# Patient Record
Sex: Male | Born: 1975 | Race: White | Hispanic: No | Marital: Married | Smoking: Former smoker
Health system: Southern US, Community
[De-identification: ages and names within clinical notes are randomized; demographics above are authoritative.]

## PROBLEM LIST (undated history)

## (undated) DIAGNOSIS — F32A Depression, unspecified: Secondary | ICD-10-CM

## (undated) DIAGNOSIS — E119 Type 2 diabetes mellitus without complications: Secondary | ICD-10-CM

## (undated) DIAGNOSIS — I10 Essential (primary) hypertension: Secondary | ICD-10-CM

## (undated) DIAGNOSIS — M1990 Unspecified osteoarthritis, unspecified site: Secondary | ICD-10-CM

## (undated) DIAGNOSIS — E785 Hyperlipidemia, unspecified: Secondary | ICD-10-CM

## (undated) DIAGNOSIS — Z87442 Personal history of urinary calculi: Secondary | ICD-10-CM

## (undated) DIAGNOSIS — F329 Major depressive disorder, single episode, unspecified: Secondary | ICD-10-CM

## (undated) HISTORY — PX: BACK SURGERY: SHX140

## (undated) HISTORY — PX: ABDOMINAL SURGERY: SHX537

## (undated) HISTORY — PX: PARTIAL COLECTOMY: SHX5273

## (undated) HISTORY — PX: APPENDECTOMY: SHX54

---

## 2002-12-03 ENCOUNTER — Encounter: Payer: Self-pay | Admitting: Emergency Medicine

## 2002-12-03 ENCOUNTER — Emergency Department (HOSPITAL_COMMUNITY): Admission: EM | Admit: 2002-12-03 | Discharge: 2002-12-03 | Payer: Self-pay | Admitting: Emergency Medicine

## 2003-05-15 ENCOUNTER — Emergency Department (HOSPITAL_COMMUNITY): Admission: EM | Admit: 2003-05-15 | Discharge: 2003-05-15 | Payer: Self-pay | Admitting: Emergency Medicine

## 2004-03-23 ENCOUNTER — Emergency Department (HOSPITAL_COMMUNITY): Admission: EM | Admit: 2004-03-23 | Discharge: 2004-03-23 | Payer: Self-pay | Admitting: Emergency Medicine

## 2004-11-17 ENCOUNTER — Emergency Department (HOSPITAL_COMMUNITY): Admission: EM | Admit: 2004-11-17 | Discharge: 2004-11-17 | Payer: Self-pay | Admitting: Emergency Medicine

## 2005-04-14 ENCOUNTER — Ambulatory Visit: Payer: Self-pay | Admitting: "Endocrinology

## 2006-05-10 ENCOUNTER — Ambulatory Visit (HOSPITAL_COMMUNITY): Admission: RE | Admit: 2006-05-10 | Discharge: 2006-05-10 | Payer: Self-pay | Admitting: Family Medicine

## 2007-09-21 ENCOUNTER — Emergency Department (HOSPITAL_COMMUNITY): Admission: EM | Admit: 2007-09-21 | Discharge: 2007-09-21 | Payer: Self-pay | Admitting: Emergency Medicine

## 2007-12-07 ENCOUNTER — Ambulatory Visit (HOSPITAL_COMMUNITY): Admission: RE | Admit: 2007-12-07 | Discharge: 2007-12-07 | Payer: Self-pay | Admitting: Family Medicine

## 2007-12-08 ENCOUNTER — Emergency Department (HOSPITAL_COMMUNITY): Admission: EM | Admit: 2007-12-08 | Discharge: 2007-12-08 | Payer: Self-pay | Admitting: Emergency Medicine

## 2008-05-10 ENCOUNTER — Emergency Department (HOSPITAL_COMMUNITY): Admission: EM | Admit: 2008-05-10 | Discharge: 2008-05-10 | Payer: Self-pay | Admitting: Emergency Medicine

## 2008-05-10 ENCOUNTER — Encounter: Payer: Self-pay | Admitting: Orthopedic Surgery

## 2008-05-15 ENCOUNTER — Ambulatory Visit: Payer: Self-pay | Admitting: Orthopedic Surgery

## 2008-05-15 DIAGNOSIS — E119 Type 2 diabetes mellitus without complications: Secondary | ICD-10-CM

## 2008-05-15 DIAGNOSIS — M25579 Pain in unspecified ankle and joints of unspecified foot: Secondary | ICD-10-CM | POA: Insufficient documentation

## 2008-05-15 DIAGNOSIS — S93409A Sprain of unspecified ligament of unspecified ankle, initial encounter: Secondary | ICD-10-CM | POA: Insufficient documentation

## 2008-05-17 ENCOUNTER — Ambulatory Visit: Payer: Self-pay | Admitting: Orthopedic Surgery

## 2008-05-21 ENCOUNTER — Ambulatory Visit: Payer: Self-pay | Admitting: Orthopedic Surgery

## 2008-06-11 ENCOUNTER — Ambulatory Visit: Payer: Self-pay | Admitting: Orthopedic Surgery

## 2008-07-02 ENCOUNTER — Ambulatory Visit: Payer: Self-pay | Admitting: Orthopedic Surgery

## 2008-09-11 ENCOUNTER — Encounter: Payer: Self-pay | Admitting: Orthopedic Surgery

## 2009-06-29 ENCOUNTER — Ambulatory Visit (HOSPITAL_COMMUNITY): Admission: RE | Admit: 2009-06-29 | Discharge: 2009-06-29 | Payer: Self-pay | Admitting: Internal Medicine

## 2010-06-12 LAB — GLUCOSE, CAPILLARY: Glucose-Capillary: 211 mg/dL — ABNORMAL HIGH (ref 70–99)

## 2010-11-28 LAB — CBC
MCV: 89.8
RBC: 4.23
WBC: 5.3

## 2010-11-28 LAB — URINALYSIS, ROUTINE W REFLEX MICROSCOPIC
Bilirubin Urine: NEGATIVE
Nitrite: NEGATIVE
Specific Gravity, Urine: 1.01
pH: 6.5

## 2010-11-28 LAB — DIFFERENTIAL
Lymphocytes Relative: 11 — ABNORMAL LOW
Lymphs Abs: 0.6 — ABNORMAL LOW
Monocytes Relative: 6
Neutro Abs: 4.4
Neutrophils Relative %: 83 — ABNORMAL HIGH

## 2010-11-28 LAB — BASIC METABOLIC PANEL
Chloride: 98
Creatinine, Ser: 1
GFR calc Af Amer: >60
GFR calc non Af Amer: >60

## 2010-11-28 LAB — URINE MICROSCOPIC-ADD ON

## 2011-04-20 ENCOUNTER — Encounter (INDEPENDENT_AMBULATORY_CARE_PROVIDER_SITE_OTHER): Payer: 59 | Admitting: Ophthalmology

## 2011-04-20 DIAGNOSIS — H33309 Unspecified retinal break, unspecified eye: Secondary | ICD-10-CM

## 2011-04-20 DIAGNOSIS — H4420 Degenerative myopia, unspecified eye: Secondary | ICD-10-CM

## 2011-04-20 DIAGNOSIS — E11319 Type 2 diabetes mellitus with unspecified diabetic retinopathy without macular edema: Secondary | ICD-10-CM

## 2011-04-20 DIAGNOSIS — H2510 Age-related nuclear cataract, unspecified eye: Secondary | ICD-10-CM

## 2011-04-20 DIAGNOSIS — E1039 Type 1 diabetes mellitus with other diabetic ophthalmic complication: Secondary | ICD-10-CM

## 2011-04-20 DIAGNOSIS — H43819 Vitreous degeneration, unspecified eye: Secondary | ICD-10-CM

## 2011-04-27 ENCOUNTER — Ambulatory Visit (INDEPENDENT_AMBULATORY_CARE_PROVIDER_SITE_OTHER): Payer: 59 | Admitting: Ophthalmology

## 2011-04-27 DIAGNOSIS — H33309 Unspecified retinal break, unspecified eye: Secondary | ICD-10-CM

## 2011-05-06 ENCOUNTER — Ambulatory Visit (INDEPENDENT_AMBULATORY_CARE_PROVIDER_SITE_OTHER): Payer: 59 | Admitting: Ophthalmology

## 2011-08-26 ENCOUNTER — Ambulatory Visit (INDEPENDENT_AMBULATORY_CARE_PROVIDER_SITE_OTHER): Payer: 59 | Admitting: Ophthalmology

## 2012-01-19 ENCOUNTER — Emergency Department (HOSPITAL_COMMUNITY)
Admission: EM | Admit: 2012-01-19 | Discharge: 2012-01-19 | Disposition: A | Discharge status: Home / Self Care | Payer: 59 | Attending: Emergency Medicine | Admitting: Emergency Medicine

## 2012-01-19 ENCOUNTER — Encounter (HOSPITAL_COMMUNITY): Payer: Self-pay | Admitting: *Deleted

## 2012-01-19 DIAGNOSIS — Z794 Long term (current) use of insulin: Secondary | ICD-10-CM | POA: Insufficient documentation

## 2012-01-19 DIAGNOSIS — S39012A Strain of muscle, fascia and tendon of lower back, initial encounter: Secondary | ICD-10-CM

## 2012-01-19 DIAGNOSIS — S335XXA Sprain of ligaments of lumbar spine, initial encounter: Secondary | ICD-10-CM | POA: Insufficient documentation

## 2012-01-19 DIAGNOSIS — Y9389 Activity, other specified: Secondary | ICD-10-CM | POA: Insufficient documentation

## 2012-01-19 DIAGNOSIS — Y929 Unspecified place or not applicable: Secondary | ICD-10-CM | POA: Insufficient documentation

## 2012-01-19 DIAGNOSIS — Z79899 Other long term (current) drug therapy: Secondary | ICD-10-CM | POA: Insufficient documentation

## 2012-01-19 DIAGNOSIS — E119 Type 2 diabetes mellitus without complications: Secondary | ICD-10-CM | POA: Insufficient documentation

## 2012-01-19 DIAGNOSIS — X503XXA Overexertion from repetitive movements, initial encounter: Secondary | ICD-10-CM | POA: Insufficient documentation

## 2012-01-19 HISTORY — DX: Hyperlipidemia, unspecified: E78.5

## 2012-01-19 HISTORY — DX: Essential (primary) hypertension: I10

## 2012-01-19 HISTORY — DX: Type 2 diabetes mellitus without complications: E11.9

## 2012-01-19 MED ORDER — KETOROLAC TROMETHAMINE 60 MG/2ML IM SOLN
60.0000 mg | Freq: Once | INTRAMUSCULAR | Status: AC
  Administered 2012-01-19: 60 mg via INTRAMUSCULAR
  Filled 2012-01-19: qty 2

## 2012-01-19 MED ORDER — METHOCARBAMOL 500 MG PO TABS: 500.0000 mg | ORAL_TABLET | Freq: Two times a day (BID) | ORAL | Status: DC | PRN

## 2012-01-19 MED ORDER — NAPROXEN 500 MG PO TABS: 500.0000 mg | ORAL_TABLET | Freq: Two times a day (BID) | ORAL | Status: DC

## 2012-01-19 MED ORDER — OXYCODONE-ACETAMINOPHEN 5-325 MG PO TABS: 1.0000 | ORAL_TABLET | ORAL | Status: DC | PRN

## 2012-01-19 NOTE — ED Provider Notes (Signed)
History     CSN: [id number]  Arrival date & time 01/19/12  0603   First MD Initiated Contact with Patient 01/19/12 [phone]      Chief Complaint  Patient presents with  . Back Pain    (Consider location/radiation/quality/duration/timing/severity/associated sxs/prior treatment) HPI Comments: 36 year old male who presents with back pain. The pain is located in the right lower back, came on last night when he tried to get out of bed to go to a trickle water has been persistent. It is better when he holds still and when he sits up, it is worse when he tries to go from a sitting to a standing position. He denies nausea vomiting fevers chills numbness weakness or difficulty urinating. He has no history of cancer and is never used a needle for drugs. He notes that he was splitting wood yesterday and felt to truck loads. He was doing a lot of bending over and lifting.  Patient is a 36 y.o. male presenting with back pain. The history is provided by the patient.  Back Pain  Pertinent negatives include no fever, no numbness and no weakness.    Past Medical History  Diagnosis Date  . Diabetes mellitus without complication     History reviewed. No pertinent past surgical history.  No family history on file.  History  Substance Use Topics  . Smoking status: Not on file  . Smokeless tobacco: Not on file  . Alcohol Use:       Review of Systems  Constitutional: Negative for fever and chills.  HENT: Negative for neck pain.   Cardiovascular: Negative for leg swelling.  Gastrointestinal: Negative for nausea and vomiting.       No incontinence of bowel  Genitourinary: Negative for difficulty urinating.       No incontinence or retention  Musculoskeletal: Positive for back pain.  Skin: Negative for rash.  Neurological: Negative for weakness and numbness.    Allergies  Review of patient's allergies indicates not on file.  Home Medications   Current Outpatient Rx  Name  Route  Sig   Dispense  Refill  . INSULIN ASPART 100 UNIT/ML [place] SOLN   Subcutaneous   Inject into the skin 3 (three) times daily before meals.         . IRBESARTAN 150 MG PO TABS   Oral   Take 150 mg by mouth at bedtime.         . SERTRALINE HCL 100 MG PO TABS   Oral   Take 100 mg by mouth daily.         Marland Kitchen SIMVASTATIN 40 MG PO TABS   Oral   Take 40 mg by mouth at bedtime.         . METHOCARBAMOL 500 MG PO TABS   Oral   Take 1 tablet (500 mg total) by mouth 2 (two) times daily as needed.   20 tablet   0   . NAPROXEN 500 MG PO TABS   Oral   Take 1 tablet (500 mg total) by mouth 2 (two) times daily with a meal.   30 tablet   0   . OXYCODONE-ACETAMINOPHEN 5-325 MG PO TABS   Oral   Take 1 tablet by mouth every 4 (four) hours as needed for pain.   20 tablet   0     BP 155/76  Pulse 96  Temp 98.8 F (37.1 C) (Oral)  Resp 18  SpO2 98%  Physical Exam  Nursing note and vitals  reviewed. Constitutional: He appears well-developed and well-nourished.  HENT:  Head: Normocephalic and atraumatic.  Eyes: Conjunctivae normal are normal. No scleral icterus.  Cardiovascular: Normal rate, regular rhythm and intact distal pulses.   Pulmonary/Chest: Effort normal and breath sounds normal.  Abdominal: Soft.       No pulsating masses, no guarding, no tenderness  Musculoskeletal: He exhibits tenderness ( Local tenderness to the right lower back, no spinal tenderness of the cervical, thoracic, lumbar spines).       No spinal tenderness of the cervical, thoracic or lumbar spines  Neurological: He is alert.       Gait is antalgic secondary to low back pain, isolated strength of the bilateral lower extremities is normal, sensation normal, speech normal  Skin: Skin is warm and dry. No erythema.    ED Course  Procedures (including critical care time)  Labs Reviewed - No data to display No results found.   1. Lumbar strain       MDM  The patient likely has a muscular strain of his  lumbar back. He has no spinal tenderness and no focal neurologic deficits, no red flags for pathologic back pain. Intramuscular Toradol given, discharged home on Robaxin, Naprosyn and Percocet. He will take 1-2 days off of work as needed for appropriate healing.        Vida Roller, MD 01/19/12 [phone]

## 2012-01-19 NOTE — ED Notes (Addendum)
Pt reporting pain in lower back.  Reports he strained back yesterday chopping wood.  Pt states he did have back surgery several years ago but has had very little problems since that time.   Denies any other known injury.

## 2012-07-17 ENCOUNTER — Emergency Department (HOSPITAL_COMMUNITY): Payer: 59

## 2012-07-17 ENCOUNTER — Encounter (HOSPITAL_COMMUNITY): Payer: Self-pay

## 2012-07-17 ENCOUNTER — Emergency Department (HOSPITAL_COMMUNITY)
Admission: EM | Admit: 2012-07-17 | Discharge: 2012-07-17 | Disposition: A | Discharge status: Home / Self Care | Payer: 59 | Attending: Emergency Medicine | Admitting: Emergency Medicine

## 2012-07-17 DIAGNOSIS — I10 Essential (primary) hypertension: Secondary | ICD-10-CM | POA: Insufficient documentation

## 2012-07-17 DIAGNOSIS — F17200 Nicotine dependence, unspecified, uncomplicated: Secondary | ICD-10-CM | POA: Insufficient documentation

## 2012-07-17 DIAGNOSIS — M545 Low back pain, unspecified: Secondary | ICD-10-CM | POA: Insufficient documentation

## 2012-07-17 DIAGNOSIS — Z794 Long term (current) use of insulin: Secondary | ICD-10-CM | POA: Insufficient documentation

## 2012-07-17 DIAGNOSIS — E785 Hyperlipidemia, unspecified: Secondary | ICD-10-CM | POA: Insufficient documentation

## 2012-07-17 DIAGNOSIS — M549 Dorsalgia, unspecified: Secondary | ICD-10-CM

## 2012-07-17 DIAGNOSIS — E119 Type 2 diabetes mellitus without complications: Secondary | ICD-10-CM | POA: Insufficient documentation

## 2012-07-17 DIAGNOSIS — Z9889 Other specified postprocedural states: Secondary | ICD-10-CM | POA: Insufficient documentation

## 2012-07-17 DIAGNOSIS — G8929 Other chronic pain: Secondary | ICD-10-CM | POA: Insufficient documentation

## 2012-07-17 DIAGNOSIS — Z79899 Other long term (current) drug therapy: Secondary | ICD-10-CM | POA: Insufficient documentation

## 2012-07-17 MED ORDER — KETOROLAC TROMETHAMINE 60 MG/2ML IM SOLN
30.0000 mg | Freq: Once | INTRAMUSCULAR | Status: AC
  Administered 2012-07-17: 30 mg via INTRAMUSCULAR
  Filled 2012-07-17: qty 2

## 2012-07-17 MED ORDER — OXYCODONE-ACETAMINOPHEN 5-325 MG PO TABS: 1.0000 | ORAL_TABLET | ORAL | Status: DC | PRN

## 2012-07-17 NOTE — ED Notes (Signed)
Pt states it feels like a "spasm"  In his lower back, denies injury or strain

## 2012-07-17 NOTE — ED Provider Notes (Signed)
History  This chart was scribed for Joya Gaskins, MD by Bennett Scrape, ED Scribe. This patient was seen in room APA09/APA09 and the patient's care was started at 7:00 AM.  CSN: [id number]  Arrival date & time 07/17/12  0610   First MD Initiated Contact with Patient 07/17/12 0700      Chief Complaint  Patient presents with  . Back Pain     Patient is a 37 y.o. male presenting with back pain. The history is provided by the patient. No language interpreter was used.  Back Pain Location:  Lumbar spine Quality:  Cramping Radiates to:  Does not radiate Pain severity:  Severe Onset quality:  Sudden Timing:  Constant Progression:  Unchanged Chronicity:  Chronic Relieved by:  Nothing Worsened by:  Movement, bending and ambulation Ineffective treatments:  Muscle relaxants Associated symptoms: no bladder incontinence, no bowel incontinence, no dysuria, no numbness and no weakness     HPI Comments: Jesse Rodgers is a 37 y.o. male with a h/o prior back injury 20 years ago with hardware placement back surgery who presents to the Emergency Department complaining of sudden onset, constant, non-radiating lower back pain described as a cramp that woke him up this morning. He reports that he had milder pain in the same area last night but nothing this severe. He endorses that quick movements worsen the pain and states that he has experienced trouble ambulating since this morning due to the severity. He denies any recent falls in the past week. 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 He reports that he had a prescription for naprosyn for similar on-going back pain but reports that it has not been improving his symptoms. He states that he recently ran out but has a refill that he has yet to pick up. He states that he took robaxin last night but denies taking any medications this morning. He denies fevers, emesis, weakness, numbness, dysuria and urinary or bowel  incontinence as associated symptoms. He has a h/o DM that is well-controlled with lantus and novolog.   Past Medical History  Diagnosis Date  . Diabetes mellitus without complication   . Hypertension   . Hyperlipidemia     Past Surgical History  Procedure Laterality Date  . Back surgery    . Abdominal surgery      No family history on file.  History  Substance Use Topics  . Smoking status: Current Every Day Smoker  . Smokeless tobacco: Not on file  . Alcohol Use: No      Review of Systems  Gastrointestinal: Negative for bowel incontinence.  Genitourinary: Negative for bladder incontinence and dysuria.  Musculoskeletal: Positive for back pain.  Neurological: Negative for weakness and numbness.  All other systems reviewed and are negative.    Allergies  Review of patient's allergies indicates no known allergies.  Home Medications   Current Outpatient Rx  Name  Route  Sig  Dispense  Refill  . insulin aspart (NOVOLOG) 100 UNIT/ML injection   Subcutaneous   Inject into the skin 3 (three) times daily before meals.         . insulin glargine (LANTUS) 100 UNIT/ML injection   Subcutaneous   Inject 50 Units into the skin at bedtime.         . irbesartan (AVAPRO) 150 MG tablet   Oral   Take 150 mg by mouth at bedtime.         . methocarbamol (  ROBAXIN) 500 MG tablet   Oral   Take 1 tablet (500 mg total) by mouth 2 (two) times daily as needed.   20 tablet   0   . naproxen (NAPROSYN) 500 MG tablet   Oral   Take 1 tablet (500 mg total) by mouth 2 (two) times daily with a meal.   30 tablet   0   . rOPINIRole (REQUIP) 0.5 MG tablet   Oral   Take 0.5 mg by mouth 3 (three) times daily.         . sertraline (ZOLOFT) 100 MG tablet   Oral   Take 100 mg by mouth daily.         . simvastatin (ZOCOR) 40 MG tablet   Oral   Take 40 mg by mouth at bedtime.         Marland Kitchen oxyCODONE-acetaminophen (PERCOCET) 5-325 MG per tablet   Oral   Take 1 tablet by mouth  every 4 (four) hours as needed for pain.   20 tablet   0     Triage Vitals: BP 118/71  Pulse 98  Temp(Src) 98.2 F (36.8 C) (Oral)  Resp 20  Ht 6\' 2"  (1.88 m)  Wt 205 lb (92.987 kg)  BMI 26.31 kg/m2  Physical Exam  Nursing note and vitals reviewed.  CONSTITUTIONAL: Well developed/well nourished HEAD: Normocephalic/atraumatic EYES: EOMI/PERRL ENMT: Mucous membranes moist NECK: supple no meningeal signs SPINE:lumbar tenderness, well-healed scar to the lower back CV: S1/S2 noted, no murmurs/rubs/gallops noted LUNGS: Lungs are clear to auscultation bilaterally, no apparent distress ABDOMEN: soft, nontender, no rebound or guarding GU:no cva tenderness NEURO: Awake/alert, equal distal motor: hip flexion/knee flexion/extension, ankle dorsi/plantar flexion, great toe extension intact bilaterally, no clonus bilaterally, no apparent sensory deficit in any dermatome.  Equal patellar/achilles reflex noted.  EXTREMITIES: pulses normal, full ROM SKIN: warm, color normal PSYCH: no abnormalities of mood noted   ED Course  Procedures   Medications  ketorolac (TORADOL) injection 30 mg (not administered)    DIAGNOSTIC STUDIES: None performed.    COORDINATION OF CARE: 7:13 AM-Pt reports that he drove himself to the ED. Advised pt that he will not receive narcotics during this visit. Discussed treatment plan which includes xray of the lumbar spine and toradol with pt at bedside and pt agreed to plan. Will prescribe percocet.  Pt stable for d/c.  Advised need for outpatient followup He denied urinary symptoms to suggest uti He has no signs of acute neurologic/vascular emergency   MDM  Nursing notes including past medical history and social history reviewed and considered in documentation xrays reviewed and considered      I personally performed the services described in this documentation, which was scribed in my presence. The recorded information has been reviewed and is  accurate.       Joya Gaskins, MD 07/17/12 [phone]

## 2012-07-17 NOTE — ED Notes (Signed)
States he started having right sided back pain this morning around 4 am.  States that the pain feels like a spasm and he is having a hard time changing positions from sitting to standing.  States that this has happened to him before in the past.

## 2014-10-20 ENCOUNTER — Emergency Department (HOSPITAL_COMMUNITY)
Admission: EM | Admit: 2014-10-20 | Discharge: 2014-10-20 | Disposition: A | Discharge status: Home / Self Care | Payer: 59 | Attending: Emergency Medicine | Admitting: Emergency Medicine

## 2014-10-20 ENCOUNTER — Encounter (HOSPITAL_COMMUNITY): Payer: Self-pay | Admitting: Emergency Medicine

## 2014-10-20 DIAGNOSIS — Z79899 Other long term (current) drug therapy: Secondary | ICD-10-CM | POA: Insufficient documentation

## 2014-10-20 DIAGNOSIS — K0889 Other specified disorders of teeth and supporting structures: Secondary | ICD-10-CM

## 2014-10-20 DIAGNOSIS — Z794 Long term (current) use of insulin: Secondary | ICD-10-CM | POA: Insufficient documentation

## 2014-10-20 DIAGNOSIS — R220 Localized swelling, mass and lump, head: Secondary | ICD-10-CM

## 2014-10-20 DIAGNOSIS — E119 Type 2 diabetes mellitus without complications: Secondary | ICD-10-CM | POA: Insufficient documentation

## 2014-10-20 DIAGNOSIS — Z720 Tobacco use: Secondary | ICD-10-CM | POA: Insufficient documentation

## 2014-10-20 DIAGNOSIS — I10 Essential (primary) hypertension: Secondary | ICD-10-CM | POA: Insufficient documentation

## 2014-10-20 DIAGNOSIS — K088 Other specified disorders of teeth and supporting structures: Secondary | ICD-10-CM | POA: Insufficient documentation

## 2014-10-20 DIAGNOSIS — E785 Hyperlipidemia, unspecified: Secondary | ICD-10-CM | POA: Insufficient documentation

## 2014-10-20 MED ORDER — KETOROLAC TROMETHAMINE 60 MG/2ML IM SOLN
60.0000 mg | Freq: Once | INTRAMUSCULAR | Status: AC
  Administered 2014-10-20: 60 mg via INTRAMUSCULAR
  Filled 2014-10-20: qty 2

## 2014-10-20 NOTE — ED Notes (Addendum)
Patient states that he has had left-sided facial swelling since Monday. He went to dentist on Monday to get a tooth pulled that was bothering him. Patient has been trying to relieve the pain with pain killers and is on an antibiotic that the patient cannot remember the name of.

## 2014-10-20 NOTE — ED Provider Notes (Signed)
CSN: [id number]     Arrival date & time 10/20/14  0865 History   First MD Initiated Contact with Patient 10/20/14 [phone]     Chief Complaint  Patient presents with  . Facial Swelling     (Consider location/radiation/quality/duration/timing/severity/associated sxs/prior Treatment) HPI Comments: 39 year old male with history of diabetes, cigarette smoker presents with persistent left lower dental/jaw pain since tooth extraction on Monday. Patient went back and said the dentist and had suture placed for possible dry socket. Patient taking narcotics in his buttocks as directed pain still persistent. Patient tolerating liquid. Hurts constant and worse with palpation. Swelling is improved in the left face.  The history is provided by the patient.    Past Medical History  Diagnosis Date  . Diabetes mellitus without complication   . Hypertension   . Hyperlipidemia    Past Surgical History  Procedure Laterality Date  . Back surgery    . Abdominal surgery     No family history on file. Social History  Substance Use Topics  . Smoking status: Current Every Day Smoker  . Smokeless tobacco: None  . Alcohol Use: Yes     Comment: occasional    Review of Systems  Constitutional: Negative for fever and chills.  HENT: Positive for facial swelling. Negative for congestion.   Eyes: Negative for visual disturbance.  Respiratory: Negative for shortness of breath.   Cardiovascular: Negative for chest pain.  Gastrointestinal: Negative for vomiting and abdominal pain.  Genitourinary: Negative for dysuria and flank pain.  Musculoskeletal: Negative for back pain, neck pain and neck stiffness.  Skin: Negative for rash.  Neurological: Negative for light-headedness and headaches.      Allergies  Review of patient's allergies indicates no known allergies.  Home Medications   Prior to Admission medications   Medication Sig Start Date End Date Taking? Authorizing Provider  insulin aspart  (NOVOLOG) 100 UNIT/ML injection Inject into the skin 3 (three) times daily before meals.    Historical Provider, MD  insulin glargine (LANTUS) 100 UNIT/ML injection Inject 50 Units into the skin at bedtime.    Historical Provider, MD  irbesartan (AVAPRO) 150 MG tablet Take 150 mg by mouth at bedtime.    Historical Provider, MD  methocarbamol (ROBAXIN) 500 MG tablet Take 1 tablet (500 mg total) by mouth 2 (two) times daily as needed. 01/19/12   Eber Hong, MD  naproxen (NAPROSYN) 500 MG tablet Take 1 tablet (500 mg total) by mouth 2 (two) times daily with a meal. 01/19/12   Eber Hong, MD  oxyCODONE-acetaminophen (PERCOCET/ROXICET) 5-325 MG per tablet Take 1 tablet by mouth every 4 (four) hours as needed for pain. 07/17/12   Zadie Rhine, MD  rOPINIRole (REQUIP) 0.5 MG tablet Take 0.5 mg by mouth 3 (three) times daily.    Historical Provider, MD  sertraline (ZOLOFT) 100 MG tablet Take 100 mg by mouth daily.    Historical Provider, MD  simvastatin (ZOCOR) 40 MG tablet Take 40 mg by mouth at bedtime.    Historical Provider, MD   BP 155/99 mmHg  Pulse 96  Temp(Src) 98.5 F (36.9 C) (Oral)  Resp 16  Ht 6\' 3"  (1.905 m)  Wt 200 lb (90.719 kg)  BMI 25.00 kg/m2  SpO2 100% Physical Exam  Constitutional: He appears well-developed and well-nourished. No distress.  HENT:  Head: Normocephalic and atraumatic.  Patient has mild tenderness left lower anterior gingiva decide recent tooth extraction no swelling or abscess appreciated no trismus mild facial swelling in the left no  submandibular swelling neck supple.  Eyes: Conjunctivae are normal. Pupils are equal, round, and reactive to light.  Cardiovascular: Normal rate.   Pulmonary/Chest: Effort normal.  Nursing note and vitals reviewed.   ED Course  Procedures (including critical care time) Labs Review Labs Reviewed - No data to display  Imaging Review No results found. I have personally reviewed and evaluated these images and lab results  as part of my medical decision-making.   EKG Interpretation None      MDM   Final diagnoses:  Facial swelling  Pain, dental   patient presents with persistent swelling and pain after tooth extraction. No sign of emergent ENT infection. Patient on antibody X and has pain meds. Patient wishes to try Toradol prior to discharge.  Results and differential diagnosis were discussed with the patient/parent/guardian. Xrays were independently reviewed by myself.  Close follow up outpatient was discussed, comfortable with the plan.   Medications  ketorolac (TORADOL) injection 60 mg (60 mg Intramuscular Given 10/20/14 0727)    Filed Vitals:   10/20/14 0709  BP: 155/99  Pulse: 96  Temp: 98.5 F (36.9 C)  TempSrc: Oral  Resp: 16  Height:  (1.905 m)  Weight: 200 lb (90.719 kg)  SpO2: 100%    Final diagnoses:  Facial swelling  Pain, dental      Blane Ohara, MD 10/20/14 [phone]

## 2014-10-20 NOTE — ED Notes (Signed)
MD at bedside. 

## 2014-10-20 NOTE — Discharge Instructions (Signed)
If you were given medicines take as directed.  If you are on coumadin or contraceptives realize their levels and effectiveness is altered by many different medicines.  If you have any reaction (rash, tongues swelling, other) to the medicines stop taking and see a physician.   Call your dentist if no improvement by Monday, finish your antibiotics If your blood pressure was elevated in the ER make sure you follow up for management with a primary doctor or return for chest pain, shortness of breath or stroke symptoms.  Please follow up as directed and return to the ER or see a physician for new or worsening symptoms.  Thank you. Filed Vitals:   10/20/14 0709  BP: 155/99  Pulse: 96  Temp: 98.5 F (36.9 C)  TempSrc: Oral  Resp: 16  Height:  (1.905 m)  Weight: 200 lb (90.719 kg)  SpO2: 100%

## 2014-10-21 ENCOUNTER — Emergency Department (HOSPITAL_COMMUNITY): Payer: 59

## 2014-10-21 ENCOUNTER — Encounter (HOSPITAL_COMMUNITY): Payer: Self-pay | Admitting: *Deleted

## 2014-10-21 ENCOUNTER — Emergency Department (HOSPITAL_COMMUNITY)
Admission: EM | Admit: 2014-10-21 | Discharge: 2014-10-21 | Disposition: A | Discharge status: Home / Self Care | Payer: 59 | Attending: Emergency Medicine | Admitting: Emergency Medicine

## 2014-10-21 DIAGNOSIS — K047 Periapical abscess without sinus: Secondary | ICD-10-CM

## 2014-10-21 DIAGNOSIS — R220 Localized swelling, mass and lump, head: Secondary | ICD-10-CM

## 2014-10-21 DIAGNOSIS — I10 Essential (primary) hypertension: Secondary | ICD-10-CM | POA: Insufficient documentation

## 2014-10-21 DIAGNOSIS — Z720 Tobacco use: Secondary | ICD-10-CM | POA: Insufficient documentation

## 2014-10-21 DIAGNOSIS — E119 Type 2 diabetes mellitus without complications: Secondary | ICD-10-CM | POA: Insufficient documentation

## 2014-10-21 DIAGNOSIS — Z794 Long term (current) use of insulin: Secondary | ICD-10-CM | POA: Insufficient documentation

## 2014-10-21 DIAGNOSIS — E785 Hyperlipidemia, unspecified: Secondary | ICD-10-CM | POA: Insufficient documentation

## 2014-10-21 DIAGNOSIS — Z79899 Other long term (current) drug therapy: Secondary | ICD-10-CM | POA: Insufficient documentation

## 2014-10-21 LAB — CBC WITH DIFFERENTIAL/PLATELET
BASOS ABS: 0 10*3/uL (ref 0.0–0.1)
Basophils Relative: 0 % (ref 0–1)
Eosinophils Absolute: 0.3 10*3/uL (ref 0.0–0.7)
Eosinophils Relative: 3 % (ref 0–5)
HEMATOCRIT: 37.8 % — AB (ref 39.0–52.0)
Hemoglobin: 13.2 g/dL (ref 13.0–17.0)
LYMPHS ABS: 3.4 10*3/uL (ref 0.7–4.0)
LYMPHS PCT: 28 % (ref 12–46)
MCH: 31 pg (ref 26.0–34.0)
MCHC: 34.9 g/dL (ref 30.0–36.0)
MCV: 88.7 fL (ref 78.0–100.0)
MONO ABS: 0.7 10*3/uL (ref 0.1–1.0)
Monocytes Relative: 6 % (ref 3–12)
NEUTROS ABS: 7.5 10*3/uL (ref 1.7–7.7)
Neutrophils Relative %: 63 % (ref 43–77)
Platelets: 228 10*3/uL (ref 150–400)
RBC: 4.26 MIL/uL (ref 4.22–5.81)
RDW: 12.3 % (ref 11.5–15.5)
WBC: 11.9 10*3/uL — AB (ref 4.0–10.5)

## 2014-10-21 LAB — BASIC METABOLIC PANEL
ANION GAP: 8 (ref 5–15)
BUN: 20 mg/dL (ref 6–20)
CO2: 26 mmol/L (ref 22–32)
Calcium: 8.4 mg/dL — ABNORMAL LOW (ref 8.9–10.3)
Chloride: 103 mmol/L (ref 101–111)
Creatinine, Ser: 0.7 mg/dL (ref 0.61–1.24)
GFR calc Af Amer: >60 mL/min (ref >60)
GFR calc non Af Amer: >60 mL/min (ref >60)
GLUCOSE: 119 mg/dL — AB (ref 65–99)
POTASSIUM: 3.8 mmol/L (ref 3.5–5.1)
Sodium: 137 mmol/L (ref 135–145)

## 2014-10-21 MED ORDER — OXYCODONE-ACETAMINOPHEN 5-325 MG PO TABS: 1.0000 | ORAL_TABLET | Freq: Once | ORAL | Status: DC

## 2014-10-21 MED ORDER — IOHEXOL 300 MG/ML  SOLN
100.0000 mL | Freq: Once | INTRAMUSCULAR | Status: AC | PRN
  Administered 2014-10-21: 100 mL via INTRAVENOUS

## 2014-10-21 MED ORDER — KETOROLAC TROMETHAMINE 30 MG/ML IJ SOLN
30.0000 mg | Freq: Once | INTRAMUSCULAR | Status: AC
  Administered 2014-10-21: 30 mg via INTRAVENOUS
  Filled 2014-10-21: qty 1

## 2014-10-21 MED ORDER — BUPIVACAINE HCL (PF) 0.25 % IJ SOLN
20.0000 mL | Freq: Once | INTRAMUSCULAR | Status: AC
  Administered 2014-10-21: 20 mL
  Filled 2014-10-21: qty 30

## 2014-10-21 MED ORDER — OXYCODONE-ACETAMINOPHEN 5-325 MG PO TABS: 1.0000 | ORAL_TABLET | ORAL | Status: DC | PRN

## 2014-10-21 MED ORDER — IBUPROFEN 600 MG PO TABS: 600.0000 mg | ORAL_TABLET | Freq: Three times a day (TID) | ORAL | Status: DC | PRN

## 2014-10-21 MED ORDER — HYDROMORPHONE HCL 1 MG/ML IJ SOLN
1.0000 mg | Freq: Once | INTRAMUSCULAR | Status: AC
  Administered 2014-10-21: 1 mg via INTRAVENOUS
  Filled 2014-10-21: qty 1

## 2014-10-21 NOTE — ED Notes (Signed)
Patient complaining of dental pain and left facial swelling. Patient states he cannot open mouth or eat. States was seen here this morning for same.

## 2014-10-21 NOTE — ED Provider Notes (Signed)
CSN: [id number]     Arrival date & time 10/21/14  0036 History  This chart was scribed for Azalia Bilis, MD by Phillis Haggis, ED Scribe. This patient was seen in room APA07/APA07 and patient care was started at 12:51 AM.    Chief Complaint  Patient presents with  . Facial Swelling   The history is provided by the patient. No language interpreter was used.   HPI Comments: Jesse Rodgers is a 39 y.o. male with a hx of DM and current everyday smoker who presents to the Emergency Department complaining of left facial swelling onset one day ago. Pt states that he had a dental extraction on Monday and has been prescribed Clindamycin and Norco to no relief. He states that the dental pain and facial swelling has been worsening since Monday and was seen yesterday morning for the same symptoms; was given Toradol prior to discharge. He denies any other symptoms.   Past Medical History  Diagnosis Date  . Diabetes mellitus without complication   . Hypertension   . Hyperlipidemia    Past Surgical History  Procedure Laterality Date  . Back surgery    . Abdominal surgery     History reviewed. No pertinent family history. Social History  Substance Use Topics  . Smoking status: Current Every Day Smoker  . Smokeless tobacco: None  . Alcohol Use: Yes     Comment: occasional    Review of Systems  HENT: Positive for dental problem and facial swelling.   All other systems reviewed and are negative.  Allergies  Review of patient's allergies indicates no known allergies.  Home Medications   Prior to Admission medications   Medication Sig Start Date End Date Taking? Authorizing Provider  insulin aspart (NOVOLOG) 100 UNIT/ML injection Inject into the skin 3 (three) times daily before meals.    Historical Provider, MD  insulin glargine (LANTUS) 100 UNIT/ML injection Inject 50 Units into the skin at bedtime.    Historical Provider, MD  irbesartan (AVAPRO) 150 MG tablet Take 150 mg by mouth at bedtime.     Historical Provider, MD  rOPINIRole (REQUIP) 0.5 MG tablet Take 0.5 mg by mouth 3 (three) times daily.    Historical Provider, MD  sertraline (ZOLOFT) 100 MG tablet Take 100 mg by mouth daily.    Historical Provider, MD  simvastatin (ZOCOR) 40 MG tablet Take 40 mg by mouth at bedtime.    Historical Provider, MD   BP 158/93 mmHg  Pulse 89  Temp(Src) 99 F (37.2 C)  Resp 20  Ht  (1.905 m)  Wt 200 lb (90.719 kg)  BMI 25.00 kg/m2  SpO2 100%  Physical Exam  Constitutional: He is oriented to person, place, and time. He appears well-developed and well-nourished.  HENT:  Head: Normocephalic and atraumatic.  Left lower 1st and 2nd molars status post extraction with some gingival swelling; left sided facial swelling; moderate trismus; no drainage  Eyes: Conjunctivae and EOM are normal. Pupils are equal, round, and reactive to light.  Neck: Normal range of motion. Neck supple.  Cardiovascular: Normal rate and regular rhythm.   Pulmonary/Chest: Effort normal and breath sounds normal.  Abdominal: Soft. Bowel sounds are normal.  Musculoskeletal: Normal range of motion.  Neurological: He is alert and oriented to person, place, and time.  Skin: Skin is warm and dry.  Psychiatric: He has a normal mood and affect. His behavior is normal.  Nursing note and vitals reviewed.   ED Course  Procedures (including critical  care time)  NERVE BLOCK Performed by: Lyanne Co Consent: Verbal consent obtained. Required items: required blood products, implants, devices, and special equipment available Time out: Immediately prior to procedure a "time out" was called to verify the correct patient, procedure, equipment, support staff and site/side marked as required. Indication: dental pain and facial pain Nerve block body site: left infraorbital nerve Preparation: Patient was prepped and draped in the usual sterile fashion. Needle gauge: 24 G Location technique: anatomical landmarks Local  anesthetic: bupivicaine 0.25% Anesthetic total: 5 ml Outcome: pain improved Patient tolerance: Patient tolerated the procedure well with no immediate complications.      DIAGNOSTIC STUDIES: Oxygen Saturation is 100% on RA, normal by my interpretation.    COORDINATION OF CARE: 12:53 AM-Discussed treatment plan which includes IV fluids and CT scan with pt at bedside and pt agreed to plan.   Labs Review Labs Reviewed  CBC WITH DIFFERENTIAL/PLATELET - Abnormal; Notable for the following:    WBC 11.9 (*)    HCT 37.8 (*)    All other components within normal limits  BASIC METABOLIC PANEL - Abnormal; Notable for the following:    Glucose, Bld 119 (*)    Calcium 8.4 (*)    All other components within normal limits    Imaging Review Ct Soft Tissue Neck W Contrast  10/21/2014   CLINICAL DATA:  Dental extractions 6 days ago with left-sided facial pain and swelling.  EXAM: CT NECK WITH CONTRAST  TECHNIQUE: Multidetector CT imaging of the neck was performed using the standard protocol following the bolus administration of intravenous contrast.  CONTRAST:  OMNIPAQUE IOHEXOL 300 MG/ML  SOLN  COMPARISON:  None.  FINDINGS: Empty sockets in the left mandibular body, the first molar and second premolar, with outer alveolar ridge irregularity/fracture. There is gas within the empty socket, but not extending to bone. Extensive swelling present in the left face, throughout the upper left masticator space - extending into the visible portions of the temporalis. No drainable abscess. No floor mouth swelling or venous thrombosis.  Pharynx and larynx: Patent.  Salivary glands: Unremarkable  Thyroid: Unremarkable  Lymph nodes: Mild reactive enlargement left neck.  No cavitation.  Vascular: No venous thrombosis.  Limited intracranial: Unremarkable  Visualized orbits: Unremarkable  Mastoids and visualized paranasal sinuses: Mild scattered inflammatory mucosal thickening.  Skeleton: Left mandibular findings as  above.  Upper chest: No apical pneumonia.  IMPRESSION: Diffuse left masticator space myositis compatible with odontogenic infection given recent ipsilateral dental extraction. No abscess or airway effacement.   Electronically Signed   By: Marnee Spring M.D.   On: 10/21/2014 01:53  I personally reviewed the imaging tests through PACS system I reviewed available ER/hospitalization records through the EMR     EKG Interpretation None      MDM   Final diagnoses:  None    3:55 AM Feeling much better at this time. No abscess. Will continue clindamycin and follow up with his oral surgeon. Pts hydrocodone will be changed to oxycodone. Recommend ibuprofen in addition for antiinflamatory properties. Understands to return to ER for new or worsening symptoms   I personally performed the services described in this documentation, which was scribed in my presence. The recorded information has been reviewed and is accurate.       Azalia Bilis, MD 10/21/14 [phone]

## 2014-10-23 ENCOUNTER — Inpatient Hospital Stay (HOSPITAL_COMMUNITY)
Admission: EM | Admit: 2014-10-23 | Discharge: 2014-10-25 | DRG: 159 | Disposition: A | Discharge status: Home / Self Care | Payer: BLUE CROSS/BLUE SHIELD | Attending: Internal Medicine | Admitting: Internal Medicine

## 2014-10-23 ENCOUNTER — Encounter (HOSPITAL_COMMUNITY): Payer: Self-pay | Admitting: *Deleted

## 2014-10-23 DIAGNOSIS — F32A Depression, unspecified: Secondary | ICD-10-CM

## 2014-10-23 DIAGNOSIS — E119 Type 2 diabetes mellitus without complications: Secondary | ICD-10-CM

## 2014-10-23 DIAGNOSIS — M272 Inflammatory conditions of jaws: Secondary | ICD-10-CM | POA: Diagnosis not present

## 2014-10-23 DIAGNOSIS — I10 Essential (primary) hypertension: Secondary | ICD-10-CM | POA: Diagnosis present

## 2014-10-23 DIAGNOSIS — F329 Major depressive disorder, single episode, unspecified: Secondary | ICD-10-CM

## 2014-10-23 DIAGNOSIS — M609 Myositis, unspecified: Secondary | ICD-10-CM | POA: Diagnosis present

## 2014-10-23 DIAGNOSIS — E860 Dehydration: Secondary | ICD-10-CM | POA: Diagnosis present

## 2014-10-23 DIAGNOSIS — E782 Mixed hyperlipidemia: Secondary | ICD-10-CM

## 2014-10-23 DIAGNOSIS — K047 Periapical abscess without sinus: Secondary | ICD-10-CM

## 2014-10-23 DIAGNOSIS — K088 Other specified disorders of teeth and supporting structures: Secondary | ICD-10-CM | POA: Diagnosis not present

## 2014-10-23 DIAGNOSIS — L03211 Cellulitis of face: Secondary | ICD-10-CM

## 2014-10-23 DIAGNOSIS — E785 Hyperlipidemia, unspecified: Secondary | ICD-10-CM | POA: Diagnosis present

## 2014-10-23 DIAGNOSIS — F17200 Nicotine dependence, unspecified, uncomplicated: Secondary | ICD-10-CM | POA: Diagnosis present

## 2014-10-23 LAB — CBC WITH DIFFERENTIAL/PLATELET
BASOS ABS: 0 10*3/uL (ref 0.0–0.1)
BASOS PCT: 0 % (ref 0–1)
EOS ABS: 0.3 10*3/uL (ref 0.0–0.7)
EOS PCT: 2 % (ref 0–5)
HCT: 37.5 % — ABNORMAL LOW (ref 39.0–52.0)
Hemoglobin: 12.8 g/dL — ABNORMAL LOW (ref 13.0–17.0)
Lymphocytes Relative: 24 % (ref 12–46)
Lymphs Abs: 2.7 10*3/uL (ref 0.7–4.0)
MCH: 31 pg (ref 26.0–34.0)
MCHC: 34.1 g/dL (ref 30.0–36.0)
MCV: 90.8 fL (ref 78.0–100.0)
MONO ABS: 0.7 10*3/uL (ref 0.1–1.0)
Monocytes Relative: 6 % (ref 3–12)
Neutro Abs: 7.6 10*3/uL (ref 1.7–7.7)
Neutrophils Relative %: 68 % (ref 43–77)
PLATELETS: 309 10*3/uL (ref 150–400)
RBC: 4.13 MIL/uL — AB (ref 4.22–5.81)
RDW: 11.9 % (ref 11.5–15.5)
WBC: 11.2 10*3/uL — AB (ref 4.0–10.5)

## 2014-10-23 LAB — BASIC METABOLIC PANEL
ANION GAP: 7 (ref 5–15)
BUN: 17 mg/dL (ref 6–20)
CALCIUM: 9 mg/dL (ref 8.9–10.3)
CO2: 29 mmol/L (ref 22–32)
Chloride: 102 mmol/L (ref 101–111)
Creatinine, Ser: 0.73 mg/dL (ref 0.61–1.24)
GLUCOSE: 222 mg/dL — AB (ref 65–99)
Potassium: 4.5 mmol/L (ref 3.5–5.1)
SODIUM: 138 mmol/L (ref 135–145)

## 2014-10-23 LAB — CBG MONITORING, ED: Glucose-Capillary: 117 mg/dL — ABNORMAL HIGH (ref 65–99)

## 2014-10-23 MED ORDER — ONDANSETRON HCL 4 MG/2ML IJ SOLN: 4.0000 mg | Freq: Four times a day (QID) | INTRAMUSCULAR | Status: DC | PRN

## 2014-10-23 MED ORDER — AMPICILLIN-SULBACTAM SODIUM 3 (2-1) G IJ SOLR
INTRAMUSCULAR | Status: AC
  Filled 2014-10-23: qty 3

## 2014-10-23 MED ORDER — INSULIN ASPART 100 UNIT/ML ~~LOC~~ SOLN
0.0000 [IU] | Freq: Three times a day (TID) | SUBCUTANEOUS | Status: DC
  Administered 2014-10-24: 2 [IU] via SUBCUTANEOUS
  Administered 2014-10-24: 15 [IU] via SUBCUTANEOUS
  Administered 2014-10-24: 3 [IU] via SUBCUTANEOUS

## 2014-10-23 MED ORDER — ENOXAPARIN SODIUM 40 MG/0.4ML ~~LOC~~ SOLN
40.0000 mg | SUBCUTANEOUS | Status: DC
  Administered 2014-10-23 – 2014-10-24 (×2): 40 mg via SUBCUTANEOUS
  Filled 2014-10-23 (×2): qty 0.4

## 2014-10-23 MED ORDER — ENSURE ENLIVE PO LIQD
237.0000 mL | Freq: Two times a day (BID) | ORAL | Status: DC
Start: 2014-10-24 — End: 2014-10-24
  Administered 2014-10-24: 237 mL via ORAL

## 2014-10-23 MED ORDER — KETOROLAC TROMETHAMINE 30 MG/ML IJ SOLN
30.0000 mg | Freq: Once | INTRAMUSCULAR | Status: AC
  Administered 2014-10-23: 30 mg via INTRAVENOUS
  Filled 2014-10-23: qty 1

## 2014-10-23 MED ORDER — INSULIN GLARGINE 100 UNIT/ML ~~LOC~~ SOLN
45.0000 [IU] | Freq: Every day | SUBCUTANEOUS | Status: DC
  Administered 2014-10-24: 45 [IU] via SUBCUTANEOUS
  Filled 2014-10-23 (×2): qty 0.45

## 2014-10-23 MED ORDER — SERTRALINE HCL 50 MG PO TABS
100.0000 mg | ORAL_TABLET | Freq: Every day | ORAL | Status: DC
  Administered 2014-10-24 – 2014-10-25 (×2): 100 mg via ORAL
  Filled 2014-10-23 (×2): qty 2

## 2014-10-23 MED ORDER — SERTRALINE HCL 50 MG PO TABS: 100.0000 mg | ORAL_TABLET | Freq: Every day | ORAL | Status: DC

## 2014-10-23 MED ORDER — ACETAMINOPHEN 650 MG RE SUPP: 650.0000 mg | Freq: Four times a day (QID) | RECTAL | Status: DC | PRN

## 2014-10-23 MED ORDER — ONDANSETRON HCL 4 MG PO TABS: 4.0000 mg | ORAL_TABLET | Freq: Four times a day (QID) | ORAL | Status: DC | PRN

## 2014-10-23 MED ORDER — ACETAMINOPHEN 325 MG PO TABS
650.0000 mg | ORAL_TABLET | Freq: Four times a day (QID) | ORAL | Status: DC | PRN
  Administered 2014-10-24: 650 mg via ORAL
  Filled 2014-10-23: qty 2

## 2014-10-23 MED ORDER — SODIUM CHLORIDE 0.9 % IV SOLN
INTRAVENOUS | Status: DC
  Administered 2014-10-23 – 2014-10-25 (×5): via INTRAVENOUS

## 2014-10-23 MED ORDER — MORPHINE SULFATE (PF) 4 MG/ML IV SOLN
4.0000 mg | INTRAVENOUS | Status: DC | PRN
  Administered 2014-10-23: 4 mg via INTRAVENOUS
  Filled 2014-10-23: qty 1

## 2014-10-23 MED ORDER — IRBESARTAN 150 MG PO TABS
150.0000 mg | ORAL_TABLET | Freq: Every day | ORAL | Status: DC
  Filled 2014-10-23: qty 1

## 2014-10-23 MED ORDER — AMPICILLIN-SULBACTAM SODIUM 3 (2-1) G IJ SOLR
3.0000 g | Freq: Four times a day (QID) | INTRAMUSCULAR | Status: DC
  Administered 2014-10-23 – 2014-10-25 (×7): 3 g via INTRAVENOUS
  Filled 2014-10-23 (×11): qty 3

## 2014-10-23 MED ORDER — SIMVASTATIN 20 MG PO TABS
40.0000 mg | ORAL_TABLET | Freq: Every day | ORAL | Status: DC
  Administered 2014-10-25: 40 mg via ORAL
  Filled 2014-10-23 (×2): qty 2

## 2014-10-23 NOTE — ED Notes (Signed)
Pt has dental pain and swelling that started 10/15/14 after getting a tooth pulled. Pt has had swelling and increasing pain since. Pt followed up with his dentist on 10/18/14 and cleaned the area and was given antibiotic (clindamycin). Pt went to ED 8/20 because of increasing pain and dischaged. Pt followed up with primary care on 10/22/14 when he was given rocephin shot and augmentin.   Today pt woke up no improvement of swelling and mild swelling into his right eye.

## 2014-10-23 NOTE — H&P (Addendum)
[facility] History and Physical  Jesse Rodgers Rodgers:[id number] DOB: [birth year] DOA: 10/23/2014  Referring physician: Dr Jesse Rodgers Rodgers - [facility] PCP: Jesse Rodgers Rodgers   Chief Complaint: Dental pain  HPI: Jesse Rodgers Rodgers is a 39 y.o. male  Tooth pain w/ mastication for several days prior to extraction 8 days ago.  8 days ago underwent L mandible tooth extraction (L second mandibular molar) Tooth was infected at the time per dentist  Pain and swelling 7 days ago and started on clinda Pain and swelling continued. Norco and ibuprofen w/o benefit.  Daily cleaning of socket w/ ice water w/ syringe Cleaned by dentist and applied single stitch 3 days ago for dry socket and told to continue clinda and norco Came to ED 3 days ago and had CT and given a nerve block. continuied on clinda.  Started augmentin by family doctor 1 day ago.  Swelling returned today primarily in the cheek.  No purulent or bloody discharge  Review of Systems:  Constitutional:  No weight loss, night sweats, Fevers, chills, fatigue.  HEENT:  No headaches, Difficulty swallowing,Sore throat, No sneezing, itching, ear ache, nasal congestion, post nasal drip,  Cardio-vascular:  No chest pain, Orthopnea, PND, swelling in lower extremities, anasarca, dizziness, palpitations  GI:  No heartburn, indigestion, abdominal pain, nausea, vomiting, diarrhea, change in bowel habits,   Resp:   No shortness of breath with exertion or at rest. No excess mucus, no productive cough, No non-productive cough, No coughing up of blood.No change in color of mucus.No wheezing.No chest wall deformity  Skin:  no rash or lesions.  GU:  no dysuria, change in color of urine, no urgency or frequency. No flank pain.  Musculoskeletal:   No joint pain or swelling. No decreased range of motion. No back pain.  Psych:  No change in mood or affect. No depression or anxiety. No memory loss.   Past Medical History  Diagnosis Date  . Diabetes mellitus  without complication   . Hypertension   . Hyperlipidemia    Past Surgical History  Procedure Laterality Date  . Back surgery    . Abdominal surgery     Social History:  reports that he has been smoking.  He does not have any smokeless tobacco history on file. He reports that he drinks alcohol. He reports that he does not use illicit drugs.  No Known Allergies  No family history on file.   Prior to Admission medications   Medication Sig Start Date End Date Taking? Authorizing Provider  CLINDAMYCIN HCL PO Take by mouth.    Historical Provider, MD  HYDROcodone-acetaminophen (NORCO/VICODIN) 5-325 MG per tablet Take 1 tablet by mouth every 6 (six) hours as needed for moderate pain.    Historical Provider, MD  ibuprofen (ADVIL,MOTRIN) 600 MG tablet Take 1 tablet (600 mg total) by mouth every 8 (eight) hours as needed. 10/21/14   Azalia Bilis, MD  insulin aspart (NOVOLOG) 100 UNIT/ML injection Inject into the skin 3 (three) times daily before meals.    Historical Provider, MD  Insulin Glargine (TOUJEO SOLOSTAR) 300 UNIT/ML SOPN Inject 45 Units into the skin daily before supper.    Historical Provider, MD  irbesartan (AVAPRO) 150 MG tablet Take 150 mg by mouth at bedtime.    Historical Provider, MD  oxyCODONE-acetaminophen (PERCOCET/ROXICET) 5-325 MG per tablet Take 1 tablet by mouth every 4 (four) hours as needed for severe pain. 10/21/14   Azalia Bilis, MD  sertraline (ZOLOFT) 100 MG tablet Take 100 mg by mouth  daily.    Historical Provider, MD  simvastatin (ZOCOR) 40 MG tablet Take 40 mg by mouth at bedtime.    Historical Provider, MD   Physical Exam: Filed Vitals:   10/23/14 1749  BP: 150/80  Pulse: 102  Temp: 98.6 F (37 C)  TempSrc: Oral  Resp: 16  Height:  (1.905 m)  Weight: 90.719 kg (200 lb)  SpO2: 100%    Wt Readings from Last 3 Encounters:  10/23/14 90.719 kg (200 lb)  10/21/14 90.719 kg (200 lb)  10/20/14 90.719 kg (200 lb)    General:  Appears calm and  comfortable Eyes:  PERRL, normal lids, irises & conjunctiva ENT:  grossly normal hearing, lips & tongue, second left mandibular molar absent with remaining socket inflamed and tender to palpation, left cheek swollen and tender to palpation, no induration or fluctuance appreciated. No purulent discharge appreciated. Neck:  no LAD, masses or thyromegaly Cardiovascular:  RRR, no m/r/g. No LE edema. Telemetry:  SR, no arrhythmias  Respiratory:  CTA bilaterally, no w/r/r. Normal respiratory effort. Abdomen:  soft, ntnd Skin:  no rash or induration seen on limited exam Musculoskeletal:  grossly normal tone BUE/BLE Psychiatric:  grossly normal mood and affect, speech fluent and appropriate Neurologic:  grossly non-focal.          Labs on Admission:  Basic Metabolic Panel:  Recent Labs Lab 10/21/14 0112 10/23/14 2029  NA 137 138  K 3.8 4.5  CL 103 102  CO2 26 29  GLUCOSE 119* 222*  BUN 20 17  CREATININE 0.70 0.73  CALCIUM 8.4* 9.0   Liver Function Tests: No results for input(s): AST, ALT, ALKPHOS, BILITOT, PROT, ALBUMIN in the last 168 hours. No results for input(s): LIPASE, AMYLASE in the last 168 hours. No results for input(s): AMMONIA in the last 168 hours. CBC:  Recent Labs Lab 10/21/14 0112 10/23/14 2029  WBC 11.9* 11.2*  NEUTROABS 7.5 7.6  HGB 13.2 12.8*  HCT 37.8* 37.5*  MCV 88.7 90.8  PLT 228 309   Cardiac Enzymes: No results for input(s): CKTOTAL, CKMB, CKMBINDEX, TROPONINI in the last 168 hours.  BNP (last 3 results) No results for input(s): BNP in the last 8760 hours.  ProBNP (last 3 results) No results for input(s): PROBNP in the last 8760 hours.  CBG: No results for input(s): GLUCAP in the last 168 hours.  Radiological Exams on Admission: No results found.   Assessment/Plan Principal Problem:   Myositis Active Problems:   Diabetes mellitus without complication   Dental infection   Essential hypertension   HLD (hyperlipidemia)   Depression    Dehydration   L facial myositis: ongoing dental infection 8+ days. Failed outpatient anabolic therapy of clindamycin for 7 days and Augmentin 1 day. Multiple dental and ED visits. CT from 10/21/2014 showing Diffuse left masticator space myositis compatible with odontogenic infection. WBC 11.2. AFVSS. No evidence of crepitus/nec. Fasciitis.  - Med Surge - Obs - Unasyn - IVF - socket flushes  Dehydration: very little oral intake for several days including fluids.  - NS 124ml/hr  DM: on injectables. CBG 222, no gap - A1c - SSI - Lantus 45 QHS  HTN: - continue Avapro  HLD: - continue Zocor  Depression:  - continue zoloft.    -   Code Status: Full DVT Prophylaxis: Lovenox Family Communication: Sister Disposition Plan: Penidngin improvmeent  Charline Hoskinson Shela Commons, MD Family Medicine [facility] [email or web address] Password [email or web address]

## 2014-10-23 NOTE — ED Provider Notes (Signed)
Patient seen and evaluated. Seen and discussed with Ivery Quale PA. Has facial cellulitis and left masseter space myositis by CT dated 8/21. Dental extraction 8 days ago. Has been on by mouth clindamycin and compliant with it for the last 7 days. Presents here with continued facial pain and trismus. Not septic toxic. However diabetic. States his blood sugars have become difficult to control.  Exam shows tenderness and soft tissue swelling along the left masseter space. Extraction of left premolar. No swelling to the floor the mouth. No elevation of the tongue. No brawniness or induration of the neck. Exam not consistent with fluid weeks. However, he is failed outpatient treatment. I discussed the case with Dr. Margot Ables [facility]. IV antibodies and admission is indicated.  Rolland Porter, MD 10/23/14 2125

## 2014-10-23 NOTE — ED Provider Notes (Addendum)
CSN: [id number]     Arrival date & time 10/23/14  1722 History   First MD Initiated Contact with Patient 10/23/14 1843     Chief Complaint  Patient presents with  . Dental Pain     (Consider location/radiation/quality/duration/timing/severity/associated sxs/prior Treatment) HPI Comments: Patient is a 39 year old male with a history of hypertension, diabetes mellitus, and hyperlipidemia who presents to the emergency department following extraction of a left premolar with increasing pain.  Patient states that he had a tooth pulled on August 15. He states that there was some infection present before the tooth was pulled. He went back to see his dentist on August 18 at which time the area was irrigated, patient was placed on clindamycin, and a stitch was placed in the evening the patient was experiencing a socket issue. The problem continued and the patient was seen in the emergency department on August 20 at which time he had a CT scan done that revealed some myositis in the masseter area. The patient was given and biotics in the emergency department on and some medication for discomfort. The patient continued to have problems and went to the primary physician on August 22 at which time he again received some Ativan Rx and was placed on Augmentin. The patient presents to the emergency department today with his sister who is a nurse for additional evaluation as they feel the patient is getting worse instead of getting better. The patient has not had high fever reported. He has had some swelling and warmth of the face. He's not had difficulty swallowing, difficulty breathing. The swelling is now approaching the right eye, and the family wanted the patient reevaluated.  Patient is a 39 y.o. male presenting with tooth pain. The history is provided by the patient.  Dental Pain Associated symptoms: facial swelling   Associated symptoms: no fever and no neck pain     Past Medical History  Diagnosis Date   . Diabetes mellitus without complication   . Hypertension   . Hyperlipidemia    Past Surgical History  Procedure Laterality Date  . Back surgery    . Abdominal surgery     History reviewed. No pertinent family history. Social History  Substance Use Topics  . Smoking status: Current Every Day Smoker -- 1.00 packs/day for 20 years  . Smokeless tobacco: None  . Alcohol Use: Yes     Comment: occasional    Review of Systems  Constitutional: Positive for appetite change. Negative for fever and activity change.       All ROS Neg except as noted in HPI  HENT: Positive for dental problem and facial swelling. Negative for nosebleeds and rhinorrhea.   Eyes: Negative for photophobia and discharge.  Respiratory: Negative for cough, shortness of breath and wheezing.   Cardiovascular: Negative for chest pain and palpitations.  Gastrointestinal: Negative for abdominal pain and blood in stool.  Genitourinary: Negative for dysuria, frequency and hematuria.  Musculoskeletal: Negative for back pain, arthralgias and neck pain.  Skin: Negative.   Neurological: Negative for dizziness, seizures and speech difficulty.  Psychiatric/Behavioral: Negative for hallucinations and confusion.      Allergies  Review of patient's allergies indicates no known allergies.  Home Medications   Prior to Admission medications   Medication Sig Start Date End Date Taking? Authorizing Provider  Cinnamon 500 MG TABS Take 2,000 mg by mouth every morning.   Yes Historical Provider, MD  Garlic 1000 MG CAPS Take 2,000 mg by mouth every morning.  Yes Historical Provider, MD  insulin aspart (NOVOLOG) 100 UNIT/ML injection Inject 1-15 Units into the skin 3 (three) times daily before meals. Sliding scale   Yes Historical Provider, MD  Insulin Glargine (TOUJEO SOLOSTAR) 300 UNIT/ML SOPN Inject 45 Units into the skin daily before supper.   Yes Historical Provider, MD  losartan (COZAAR) 25 MG tablet Take 25 mg by mouth  daily.   Yes Historical Provider, MD  Multiple Vitamin (MULTIVITAMIN WITH MINERALS) TABS tablet Take 1 tablet by mouth daily.   Yes Historical Provider, MD  oxyCODONE-acetaminophen (PERCOCET) 10-325 MG per tablet Take 1 tablet by mouth every 6 (six) hours as needed for pain.   Yes Historical Provider, MD  sertraline (ZOLOFT) 100 MG tablet Take 100 mg by mouth daily.   Yes Historical Provider, MD  simvastatin (ZOCOR) 40 MG tablet Take 40 mg by mouth daily.    Yes Historical Provider, MD  amoxicillin-clavulanate (AUGMENTIN) 875-125 MG per tablet Take 1 tablet by mouth 2 (two) times daily. 10/25/14   Henderson Cloud, MD  ibuprofen (ADVIL,MOTRIN) 200 MG tablet Take 2 tablets (400 mg total) by mouth every 8 (eight) hours. 10/25/14   Henderson Cloud, MD  oxyCODONE-acetaminophen (PERCOCET/ROXICET) 5-325 MG per tablet Take 1-2 tablets by mouth every 6 (six) hours as needed for moderate pain. 10/25/14   Henderson Cloud, MD   BP 123/66 mmHg  Pulse 105  Temp(Src) 98.4 F (36.9 C) (Oral)  Resp 16  Ht 6\' 3"  (1.905 m)  Wt 191 lb 4.8 oz (86.773 kg)  BMI 23.91 kg/m2  SpO2 99% Physical Exam  Constitutional: He is oriented to person, place, and time. He appears well-developed and well-nourished.  Non-toxic appearance.  HENT:  Head: Normocephalic.  Right Ear: Tympanic membrane and external ear normal.  Left Ear: Tympanic membrane and external ear normal.  Exam is consistent with a previously extracted premolar on the left. There is swelling of the gum. But no visible abscess. The airway is patent. And there is no swelling under the tongue.  There is mild-to-moderate swelling of the face. The left face is slightly warmer than the right. There is no pre-or postauricular nodes palpated. And there no nodes palpated in the deep cervical chain. The patient has significant decreased range of motion of the left TMJ area, and tenderness over the masseter muscle area.  Eyes: EOM and lids are  normal. Pupils are equal, round, and reactive to light.  Neck: Normal range of motion. Neck supple. Carotid bruit is not present.  Cardiovascular: Regular rhythm, normal heart sounds, intact distal pulses and normal pulses.  Tachycardia present.   Pulmonary/Chest: Breath sounds normal. No respiratory distress.  Abdominal: Soft. Bowel sounds are normal. There is no tenderness. There is no guarding.  Musculoskeletal: Normal range of motion.  Lymphadenopathy:       Head (right side): No submandibular adenopathy present.       Head (left side): No submandibular adenopathy present.    He has no cervical adenopathy.  Neurological: He is alert and oriented to person, place, and time. He has normal strength. No cranial nerve deficit or sensory deficit.  Skin: Skin is warm and dry.  Psychiatric: He has a normal mood and affect. His speech is normal.  Nursing note and vitals reviewed.   ED Course Pt seen with me by Dr Judie Petit. Fayrene Fearing.  Dr Rolland Porter spoke with hospitalist, and pt to be admitted.  Procedures (including critical care time) Labs Review Labs Reviewed  CBC WITH DIFFERENTIAL/PLATELET - Abnormal; Notable for the following:    WBC 11.2 (*)    RBC 4.13 (*)    Hemoglobin 12.8 (*)    HCT 37.5 (*)    All other components within normal limits  BASIC METABOLIC PANEL - Abnormal; Notable for the following:    Glucose, Bld 222 (*)    All other components within normal limits  CBC - Abnormal; Notable for the following:    RBC 4.11 (*)    Hemoglobin 12.6 (*)    HCT 37.0 (*)    All other components within normal limits  BASIC METABOLIC PANEL - Abnormal; Notable for the following:    Glucose, Bld 119 (*)    Calcium 8.6 (*)    All other components within normal limits  GLUCOSE, CAPILLARY - Abnormal; Notable for the following:    Glucose-Capillary 141 (*)    All other components within normal limits  GLUCOSE, CAPILLARY - Abnormal; Notable for the following:    Glucose-Capillary 364 (*)    All  other components within normal limits  GLUCOSE, CAPILLARY - Abnormal; Notable for the following:    Glucose-Capillary 173 (*)    All other components within normal limits  BASIC METABOLIC PANEL - Abnormal; Notable for the following:    Calcium 8.0 (*)    All other components within normal limits  CBC - Abnormal; Notable for the following:    WBC 10.8 (*)    RBC 3.54 (*)    Hemoglobin 10.7 (*)    HCT 31.7 (*)    All other components within normal limits  GLUCOSE, CAPILLARY - Abnormal; Notable for the following:    Glucose-Capillary 256 (*)    All other components within normal limits  GLUCOSE, CAPILLARY - Abnormal; Notable for the following:    Glucose-Capillary 59 (*)    All other components within normal limits  CBG MONITORING, ED - Abnormal; Notable for the following:    Glucose-Capillary 117 (*)    All other components within normal limits  GLUCOSE, CAPILLARY    Imaging Review No results found. I have personally reviewed and evaluated these images and lab results as part of my medical decision-making.   EKG Interpretation None      MDM  The vital signs have been reviewed. Pulse oximetry is 100% on room air. The basic metabolic panel shows a glucose to be elevated at 220 otherwise within normal limits. There is no elevation in the [place] on gap. The complete blood count shows an elevation of white blood cell count of 11,200 on this is only slightly improved from the previous August 20 visit at which time it was 11,800.  I have reviewed the previous CT scan which was consistent with a myositis involving the masseter area. It appears that the patient has failed outpatient therapy. Will discuss with Dr. Fayrene Fearing for possible admission. I discussed the findings with the patient and the patient's sister and they are in agreement with admission.    Final diagnoses:  Facial cellulitis    **I have reviewed nursing notes, vital signs, and all appropriate lab and imaging results for  this patient.Ivery Quale, PA-C 10/27/14 1625  Rolland Porter, MD 10/28/14 1610  Rolland Porter, MD 10/28/14 [phone]

## 2014-10-24 DIAGNOSIS — K088 Other specified disorders of teeth and supporting structures: Secondary | ICD-10-CM | POA: Diagnosis present

## 2014-10-24 DIAGNOSIS — F17200 Nicotine dependence, unspecified, uncomplicated: Secondary | ICD-10-CM | POA: Diagnosis present

## 2014-10-24 DIAGNOSIS — E785 Hyperlipidemia, unspecified: Secondary | ICD-10-CM | POA: Diagnosis present

## 2014-10-24 DIAGNOSIS — K047 Periapical abscess without sinus: Secondary | ICD-10-CM | POA: Diagnosis present

## 2014-10-24 DIAGNOSIS — I10 Essential (primary) hypertension: Secondary | ICD-10-CM | POA: Diagnosis present

## 2014-10-24 DIAGNOSIS — F329 Major depressive disorder, single episode, unspecified: Secondary | ICD-10-CM | POA: Diagnosis present

## 2014-10-24 DIAGNOSIS — E860 Dehydration: Secondary | ICD-10-CM

## 2014-10-24 DIAGNOSIS — M609 Myositis, unspecified: Secondary | ICD-10-CM | POA: Diagnosis present

## 2014-10-24 DIAGNOSIS — M272 Inflammatory conditions of jaws: Secondary | ICD-10-CM | POA: Diagnosis not present

## 2014-10-24 DIAGNOSIS — E119 Type 2 diabetes mellitus without complications: Secondary | ICD-10-CM | POA: Diagnosis present

## 2014-10-24 LAB — BASIC METABOLIC PANEL
Anion gap: 7 (ref 5–15)
BUN: 17 mg/dL (ref 6–20)
CHLORIDE: 104 mmol/L (ref 101–111)
CO2: 28 mmol/L (ref 22–32)
CREATININE: 0.62 mg/dL (ref 0.61–1.24)
Calcium: 8.6 mg/dL — ABNORMAL LOW (ref 8.9–10.3)
GFR calc non Af Amer: >60 mL/min (ref >60)
Glucose, Bld: 119 mg/dL — ABNORMAL HIGH (ref 65–99)
Potassium: 4.3 mmol/L (ref 3.5–5.1)
Sodium: 139 mmol/L (ref 135–145)

## 2014-10-24 LAB — CBC
HEMATOCRIT: 37 % — AB (ref 39.0–52.0)
HEMOGLOBIN: 12.6 g/dL — AB (ref 13.0–17.0)
MCH: 30.7 pg (ref 26.0–34.0)
MCHC: 34.1 g/dL (ref 30.0–36.0)
MCV: 90 fL (ref 78.0–100.0)
Platelets: 265 10*3/uL (ref 150–400)
RBC: 4.11 MIL/uL — ABNORMAL LOW (ref 4.22–5.81)
RDW: 12.2 % (ref 11.5–15.5)
WBC: 10.3 10*3/uL (ref 4.0–10.5)

## 2014-10-24 LAB — GLUCOSE, CAPILLARY
GLUCOSE-CAPILLARY: 141 mg/dL — AB (ref 65–99)
Glucose-Capillary: 364 mg/dL — ABNORMAL HIGH (ref 65–99)
Glucose-Capillary: 173 mg/dL — ABNORMAL HIGH (ref 65–99)
Glucose-Capillary: 256 mg/dL — ABNORMAL HIGH (ref 65–99)

## 2014-10-24 MED ORDER — LOSARTAN POTASSIUM 50 MG PO TABS
25.0000 mg | ORAL_TABLET | Freq: Every day | ORAL | Status: DC
  Administered 2014-10-24: 25 mg via ORAL
  Filled 2014-10-24: qty 1

## 2014-10-24 MED ORDER — OXYCODONE-ACETAMINOPHEN 5-325 MG PO TABS
2.0000 | ORAL_TABLET | Freq: Once | ORAL | Status: AC
  Administered 2014-10-24: 2 via ORAL
  Filled 2014-10-24: qty 2

## 2014-10-24 MED ORDER — OXYCODONE-ACETAMINOPHEN 5-325 MG PO TABS
2.0000 | ORAL_TABLET | ORAL | Status: DC | PRN
  Administered 2014-10-24 – 2014-10-25 (×5): 2 via ORAL
  Filled 2014-10-24 (×5): qty 2

## 2014-10-24 MED ORDER — NICOTINE 21 MG/24HR TD PT24
21.0000 mg | MEDICATED_PATCH | Freq: Every day | TRANSDERMAL | Status: DC
  Filled 2014-10-24 (×2): qty 1

## 2014-10-24 MED ORDER — KETOROLAC TROMETHAMINE 30 MG/ML IJ SOLN
30.0000 mg | Freq: Once | INTRAMUSCULAR | Status: AC
  Administered 2014-10-24: 30 mg via INTRAVENOUS
  Filled 2014-10-24: qty 1

## 2014-10-24 MED ORDER — GLUCERNA SHAKE PO LIQD
237.0000 mL | Freq: Two times a day (BID) | ORAL | Status: DC
  Administered 2014-10-24: 237 mL via ORAL

## 2014-10-24 MED ORDER — KETOROLAC TROMETHAMINE 30 MG/ML IJ SOLN
30.0000 mg | Freq: Four times a day (QID) | INTRAMUSCULAR | Status: DC | PRN
  Administered 2014-10-24 – 2014-10-25 (×4): 30 mg via INTRAVENOUS
  Filled 2014-10-24 (×4): qty 1

## 2014-10-24 MED ORDER — ALUM & MAG HYDROXIDE-SIMETH 200-200-20 MG/5ML PO SUSP
15.0000 mL | ORAL | Status: DC | PRN
  Administered 2014-10-24: 15 mL via ORAL
  Filled 2014-10-24: qty 30

## 2014-10-24 NOTE — Care Management Note (Signed)
Case Management Note  Patient Details  Name: Jesse Rodgers MRN: [id number] Date of Birth: 1976-02-11   Expected Discharge Date:   10/25/2014               Expected Discharge Plan:  Home/Self Care  In-House Referral:  NA  Discharge planning Services  CM Consult  Post Acute Care Choice:  NA Choice offered to:  NA  DME Arranged:    DME Agency:     HH Arranged:    HH Agency:     Status of Service:  Completed, signed off  Medicare Important Message Given:    Date Medicare IM Given:    Medicare IM give by:    Date Additional Medicare IM Given:    Additional Medicare Important Message give by:     If discussed at Long Length of Stay Meetings, dates discussed:    Additional Comments: Pt is from home and independent with ADL's. Pt recently changed insurance companies and his insurance will no longer pay for his test strips. Pt given new Rx for glucose supplies but explained it would probably not change what his insurance would cover. Pt discharging home with self care in 1-2 days. No further CM needs.  Malcolm Metro, RN 10/24/2014, 1:38 PM

## 2014-10-24 NOTE — Progress Notes (Signed)
[facility] PROGRESS NOTE  DAEGAN ARIZMENDI ZOX:[id number] DOB: 1975-03-08 DOA: 10/23/2014 PCP: Pershing Proud  Assessment/Plan: Odontogenic infection  -Has had multiple ER visits due to pain. -Completed 5 days of clindamycin and had taken 1 day of Augmentin prior to admission. -States he feels a little improved today. -Plan to continue Unasyn and reassess for improvement in am. -Will need continued follow up with his dentist upon DC.  DM, Insulin Dependant  -Fair to good control. -Continue current regimen. -Check A1C  Dehydration -Due to decreased PO intake with odontogenic infection. -Continue IVF until PO intake increases.  HTN -Fair control. -Continue avapro,  Hyperlipidemia -Continue Zocor.  Depression -Continue zoloft.  Code Status: Full Code Family Communication: Sister Dawn at bedside  Disposition Plan: Home when ready; anticipate 24-48 hours   Consultants:  None   Antibiotics:  Unasyn   Subjective: Feels a little improved; pain and swelling better  Objective: Filed Vitals:   10/23/14 1749 10/23/14 2203 10/23/14 2233 10/24/14 0700  BP: 150/80 133/72 158/82 142/72  Pulse: 102 95 99 89  Temp: 98.6 F (37 C) 98.7 F (37.1 C) 98.8 F (37.1 C) 98.2 F (36.8 C)  TempSrc: Oral Oral Oral Oral  Resp: 16 16  15   Height: 6\' 3"  (1.905 m)  6\' 3"  (1.905 m)   Weight: 90.719 kg (200 lb)  86.773 kg (191 lb 4.8 oz)   SpO2: 100% 99% 97% 100%    Intake/Output Summary (Last 24 hours) at 10/24/14 1113 Last data filed at 10/24/14 0554  Gross per 24 hour  Intake 1006.25 ml  Output      0 ml  Net 1006.25 ml   Filed Weights   10/23/14 1749 10/23/14 2233  Weight: 90.719 kg (200 lb) 86.773 kg (191 lb 4.8 oz)    Exam:   General:  AA Ox3, left sided facial edema  Cardiovascular: RRR  Respiratory: CTA B  Abdomen: S/NT/ND/+BS  Extremities: no C/C/E   Neurologic:  Grossly intact and non-focal  Data Reviewed: Basic Metabolic  Panel:  Recent Labs Lab 10/21/14 0112 10/23/14 2029 10/24/14 0606  NA 137 138 139  K 3.8 4.5 4.3  CL 103 102 104  CO2 26 29 28   GLUCOSE 119* 222* 119*  BUN 20 17 17   CREATININE 0.70 0.73 0.62  CALCIUM 8.4* 9.0 8.6*   Liver Function Tests: No results for input(s): AST, ALT, ALKPHOS, BILITOT, PROT, ALBUMIN in the last 168 hours. No results for input(s): LIPASE, AMYLASE in the last 168 hours. No results for input(s): AMMONIA in the last 168 hours. CBC:  Recent Labs Lab 10/21/14 0112 10/23/14 2029 10/24/14 0606  WBC 11.9* 11.2* 10.3  NEUTROABS 7.5 7.6  --   HGB 13.2 12.8* 12.6*  HCT 37.8* 37.5* 37.0*  MCV 88.7 90.8 90.0  PLT 228 309 265   Cardiac Enzymes: No results for input(s): CKTOTAL, CKMB, CKMBINDEX, TROPONINI in the last 168 hours. BNP (last 3 results) No results for input(s): BNP in the last 8760 hours.  ProBNP (last 3 results) No results for input(s): PROBNP in the last 8760 hours.  CBG:  Recent Labs Lab 10/23/14 2159 10/24/14 0759  GLUCAP 117* 141*    No results found for this or any previous visit (from the past 240 hour(s)).   Studies: No results found.  Scheduled Meds: . ampicillin-sulbactam (UNASYN) IV  3 g Intravenous Q6H  . enoxaparin (LOVENOX) injection  40 mg Subcutaneous Q24H  . feeding supplement (ENSURE ENLIVE)  237 mL Oral BID  BM  . insulin aspart  0-15 Units Subcutaneous TID WC  . insulin glargine  45 Units Subcutaneous QAC supper  . irbesartan  150 mg Oral QHS  . sertraline  100 mg Oral Daily  . simvastatin  40 mg Oral QHS   Continuous Infusions: . sodium chloride 125 mL/hr at 10/24/14 0608    Principal Problem:   Odontogenic infection of jaw Active Problems:   Dental infection   Diabetes mellitus without complication   Essential hypertension   HLD (hyperlipidemia)   Depression   Dehydration    Time spent: 25 minutes. Greater than 50% of this time was spent in direct contact with the patient coordinating  care.    Chaya Jan  [facility] Pager [phone]  If 7PM-7AM, please contact night-coverage at [email or web address], password [email or web address] 10/24/2014, 11:13 AM

## 2014-10-24 NOTE — Progress Notes (Signed)
Patient stated he was having trouble chewing on a regular diet.  Changed diet order to full liquids until he is better able to chew.

## 2014-10-24 NOTE — Progress Notes (Signed)
Utilization review completed.  

## 2014-10-24 NOTE — Progress Notes (Signed)
Patient c/o heartburn.  Dr. Ardyth Harps paged requesting an antacid.  No orders received.  2nd page send to K. Wellsite geologist.

## 2014-10-25 LAB — BASIC METABOLIC PANEL
Anion gap: 7 (ref 5–15)
BUN: 11 mg/dL (ref 6–20)
CALCIUM: 8 mg/dL — AB (ref 8.9–10.3)
CO2: 28 mmol/L (ref 22–32)
CREATININE: 0.63 mg/dL (ref 0.61–1.24)
Chloride: 105 mmol/L (ref 101–111)
GFR calc non Af Amer: >60 mL/min (ref >60)
Glucose, Bld: 70 mg/dL (ref 65–99)
Potassium: 4 mmol/L (ref 3.5–5.1)
Sodium: 140 mmol/L (ref 135–145)

## 2014-10-25 LAB — CBC
HCT: 31.7 % — ABNORMAL LOW (ref 39.0–52.0)
Hemoglobin: 10.7 g/dL — ABNORMAL LOW (ref 13.0–17.0)
MCH: 30.2 pg (ref 26.0–34.0)
MCHC: 33.8 g/dL (ref 30.0–36.0)
MCV: 89.5 fL (ref 78.0–100.0)
PLATELETS: 252 10*3/uL (ref 150–400)
RBC: 3.54 MIL/uL — AB (ref 4.22–5.81)
RDW: 12 % (ref 11.5–15.5)
WBC: 10.8 10*3/uL — ABNORMAL HIGH (ref 4.0–10.5)

## 2014-10-25 LAB — GLUCOSE, CAPILLARY
GLUCOSE-CAPILLARY: 59 mg/dL — AB (ref 65–99)
Glucose-Capillary: 79 mg/dL (ref 65–99)

## 2014-10-25 MED ORDER — AMOXICILLIN-POT CLAVULANATE 875-125 MG PO TABS: 1.0000 | ORAL_TABLET | Freq: Two times a day (BID) | ORAL | Status: DC

## 2014-10-25 MED ORDER — OXYCODONE-ACETAMINOPHEN 5-325 MG PO TABS: 1.0000 | ORAL_TABLET | Freq: Four times a day (QID) | ORAL | Status: DC | PRN

## 2014-10-25 MED ORDER — IBUPROFEN 200 MG PO TABS: 400.0000 mg | ORAL_TABLET | Freq: Three times a day (TID) | ORAL | Status: DC

## 2014-10-25 NOTE — Progress Notes (Signed)
Inpatient Diabetes Program Recommendations  AACE/ADA: New Consensus Statement on Inpatient Glycemic Control (2013)  Target Ranges:  Prepandial:   less than 140 mg/dL      Peak postprandial:   less than 180 mg/dL (1-2 hours)      Critically ill patients:  140 - 180 mg/dL  Results for WILLIOM, CEDAR (MRN [id number]) as of 10/25/2014 07:29  Ref. Range 10/25/2014 05:53  Glucose Latest Ref Range: 65-99 mg/dL 70   Results for DONA, KLEMANN (MRN [id number]) as of 10/25/2014 07:29  Ref. Range 10/24/2014 07:59 10/24/2014 11:31 10/24/2014 16:30 10/24/2014 21:59  Glucose-Capillary Latest Ref Range: 65-99 mg/dL 782 (H) 956 (H) 213 (H) 256 (H)    Outpatient Diabetes medications: Toujeo 45 units QPM with supper, Novolog 0-15 units TID with meals Current orders for Inpatient glycemic control: Lantus 45 units QPM with supper, Novolog 0-15 units TID with meals  Inpatient Diabetes Program Recommendations Insulin - Basal: Lab glucose 70 mg/dl at 0:86 this morning. Please consider decreasing Lantus to 40 units QPM. Correction (SSI): Please consider ordering Novolog bedtime correction scale. Insulin - Meal Coverage: Please consider ordering Novolog 6 units TID with meals if patient eats at least 50% of meals. HgbA1C: A1C in process.  Thanks, Orlando Penner, RN, MSN, CCRN, CDE Diabetes Coordinator Inpatient Diabetes Program [phone] (Team Pager from 8am to 5pm) [phone] ([facility] office) [phone] [facility] office) [phone] [facility] office)

## 2014-10-25 NOTE — Discharge Summary (Signed)
Physician Discharge Summary  Jesse Rodgers ZOX:[id number] DOB: 1976-01-21 DOA: 10/23/2014  PCP: Pershing Proud  Admit date: 10/23/2014 Discharge date: 10/25/2014  Time spent: 45 minutes  Recommendations for Outpatient Follow-up:  -Will be discharged home today. -Advised to follow up with dentist over next 5 days. -Continue augmentin for 8 days.   Discharge Diagnoses:  Principal Problem:   Odontogenic infection of jaw Active Problems:   Dental infection   Diabetes mellitus without complication   Essential hypertension   HLD (hyperlipidemia)   Depression   Dehydration   Myositis   Discharge Condition: Stable and improved  Filed Weights   10/23/14 1749 10/23/14 2233  Weight: 90.719 kg (200 lb) 86.773 kg (191 lb 4.8 oz)    History of present illness:  Jesse Rodgers is a 39 y.o. male  Tooth pain w/ mastication for several days prior to extraction 8 days ago.  8 days ago underwent L mandible tooth extraction (L second mandibular molar) Tooth was infected at the time per dentist  Pain and swelling 7 days ago and started on clinda Pain and swelling continued. Norco and ibuprofen w/o benefit.  Daily cleaning of socket w/ ice water w/ syringe Cleaned by dentist and applied single stitch 3 days ago for dry socket and told to continue clinda and norco Came to ED 3 days ago and had CT and given a nerve block. continuied on clinda.  Started augmentin by family doctor 1 day ago.  Swelling returned today primarily in the cheek.  No purulent or bloody discharge  Hospital Course:   Odontogenic infection  -Has had multiple ER visits due to pain. -Completed 5 days of clindamycin and had taken 1 day of Augmentin prior to admission. -States he feels much improved today. -Plan to continue augmentin for 8 days. -Ibuprofen q 8 h scheduled with percocet as breakthhru.  -Will need continued follow up with his dentist upon DC.  DM, Insulin Dependant  -Fair  control. -Continue current regimen.  Dehydration -Due to decreased PO intake with odontogenic infection. -Resolved.  HTN -Fair control. -Continue losartan.  Hyperlipidemia -Continue Zocor.  Depression -Continue zoloft.    Procedures:  None   Consultations:  None  Discharge Instructions      Discharge Instructions    Diet Carb Modified    Complete by:  As directed      Increase activity slowly    Complete by:  As directed             Medication List    STOP taking these medications        GOODY HEADACHE PO      TAKE these medications        amoxicillin-clavulanate 875-125 MG per tablet  Commonly known as:  AUGMENTIN  Take 1 tablet by mouth 2 (two) times daily.     Cinnamon 500 MG Tabs  Take 2,000 mg by mouth every morning.     Garlic 1000 MG Caps  Take 2,000 mg by mouth every morning.     ibuprofen 200 MG tablet  Commonly known as:  ADVIL,MOTRIN  Take 2 tablets (400 mg total) by mouth every 8 (eight) hours.     insulin aspart 100 UNIT/ML injection  Commonly known as:  novoLOG  Inject 1-15 Units into the skin 3 (three) times daily before meals. Sliding scale     losartan 25 MG tablet  Commonly known as:  COZAAR  Take 25 mg by mouth daily.     multivitamin  with minerals Tabs tablet  Take 1 tablet by mouth daily.     oxyCODONE-acetaminophen 10-325 MG per tablet  Commonly known as:  PERCOCET  Take 1 tablet by mouth every 6 (six) hours as needed for pain.     oxyCODONE-acetaminophen 5-325 MG per tablet  Commonly known as:  PERCOCET/ROXICET  Take 1-2 tablets by mouth every 6 (six) hours as needed for moderate pain.     sertraline 100 MG tablet  Commonly known as:  ZOLOFT  Take 100 mg by mouth daily.     simvastatin 40 MG tablet  Commonly known as:  ZOCOR  Take 40 mg by mouth daily.     TOUJEO SOLOSTAR 300 UNIT/ML Sopn  Generic drug:  Insulin Glargine  Inject 45 Units into the skin daily before supper.       No Known  Allergies Follow-up Information    Follow up with JACKSON,SAMANTHA, PA-C. Schedule an appointment as soon as possible for a visit in 2 weeks.   Specialty:  Family Medicine   Contact information:   [address] [place] [address] [phone]        The results of significant diagnostics from this hospitalization (including imaging, microbiology, ancillary and laboratory) are listed below for reference.    Significant Diagnostic Studies: Ct Soft Tissue Neck W Contrast  10/21/2014   CLINICAL DATA:  Dental extractions 6 days ago with left-sided facial pain and swelling.  EXAM: CT NECK WITH CONTRAST  TECHNIQUE: Multidetector CT imaging of the neck was performed using the standard protocol following the bolus administration of intravenous contrast.  CONTRAST:  OMNIPAQUE IOHEXOL 300 MG/ML  SOLN  COMPARISON:  None.  FINDINGS: Empty sockets in the left mandibular body, the first molar and second premolar, with outer alveolar ridge irregularity/fracture. There is gas within the empty socket, but not extending to bone. Extensive swelling present in the left face, throughout the upper left masticator space - extending into the visible portions of the temporalis. No drainable abscess. No floor mouth swelling or venous thrombosis.  Pharynx and larynx: Patent.  Salivary glands: Unremarkable  Thyroid: Unremarkable  Lymph nodes: Mild reactive enlargement left neck.  No cavitation.  Vascular: No venous thrombosis.  Limited intracranial: Unremarkable  Visualized orbits: Unremarkable  Mastoids and visualized paranasal sinuses: Mild scattered inflammatory mucosal thickening.  Skeleton: Left mandibular findings as above.  Upper chest: No apical pneumonia.  IMPRESSION: Diffuse left masticator space myositis compatible with odontogenic infection given recent ipsilateral dental extraction. No abscess or airway effacement.   Electronically Signed   By: Marnee Spring M.D.   On: 10/21/2014 01:53     Microbiology: No results found for this or any previous visit (from the past 240 hour(s)).   Labs: Basic Metabolic Panel:  Recent Labs Lab 10/21/14 0112 10/23/14 2029 10/24/14 0606 10/25/14 0553  NA 137 138 139 140  K 3.8 4.5 4.3 4.0  CL 103 102 104 105  CO2 26 29 28 28   GLUCOSE 119* 222* 119* 70  BUN 20 17 17 11   CREATININE 0.70 0.73 0.62 0.63  CALCIUM 8.4* 9.0 8.6* 8.0*   Liver Function Tests: No results for input(s): AST, ALT, ALKPHOS, BILITOT, PROT, ALBUMIN in the last 168 hours. No results for input(s): LIPASE, AMYLASE in the last 168 hours. No results for input(s): AMMONIA in the last 168 hours. CBC:  Recent Labs Lab 10/21/14 0112 10/23/14 2029 10/24/14 0606 10/25/14 0553  WBC 11.9* 11.2* 10.3 10.8*  NEUTROABS 7.5 7.6  --   --  HGB 13.2 12.8* 12.6* 10.7*  HCT 37.8* 37.5* 37.0* 31.7*  MCV 88.7 90.8 90.0 89.5  PLT 228 309 265 252   Cardiac Enzymes: No results for input(s): CKTOTAL, CKMB, CKMBINDEX, TROPONINI in the last 168 hours. BNP: BNP (last 3 results) No results for input(s): BNP in the last 8760 hours.  ProBNP (last 3 results) No results for input(s): PROBNP in the last 8760 hours.  CBG:  Recent Labs Lab 10/24/14 1131 10/24/14 1630 10/24/14 2159 10/25/14 0747 10/25/14 0833  GLUCAP 364* 173* 256* 59* 79       Signed:  HERNANDEZ ACOSTA,Tyrrell Stephens  [facility] Pager: [phone] 10/25/2014, 9:43 AM

## 2014-10-25 NOTE — Progress Notes (Signed)
Patient d/c home. D/C instructions given, signed, and understood.  Patient left floor in wheelchair and was stable upon discharge.

## 2014-10-25 NOTE — Progress Notes (Signed)
Nutrition Brief Note  Patient identified on the Malnutrition Screening Tool (MST) Report  Pt presents with dental pain. He has experienced acute wt loss associated with same. His appetite is good and he has been able to consume his full liquids pudding, jello etc. He also has an order for Glucerna to supplement nutrition while his ability to eat as usual is diminished. Patient is consuming approximately 75% of meals at this time. Labs and medications reviewed. No physical signs of malnutrition.   Body mass index is 23.91 kg/(m^2). Patient meets criteria for normal range based on current BMI.    Royann Shivers MS,RD,CSG,LDN Office: [phone] Pager: [phone]

## 2014-10-25 NOTE — Progress Notes (Signed)
Hypoglycemic Event  CBG: 59  Treatment: 15 GM carbohydrate snack  Symptoms: None  Follow-up CBG: Time: 0833 CBG Result: 79  Possible Reasons for Event: Inadequate meal intake  Comments/MD notified: Dr. Edwin Cap, [facility]  Remember to initiate Hypoglycemia Order Set & complete

## 2014-11-02 ENCOUNTER — Other Ambulatory Visit (HOSPITAL_COMMUNITY): Payer: Self-pay | Admitting: Oral and Maxillofacial Surgery

## 2014-11-02 DIAGNOSIS — L089 Local infection of the skin and subcutaneous tissue, unspecified: Secondary | ICD-10-CM

## 2014-12-04 ENCOUNTER — Other Ambulatory Visit (HOSPITAL_COMMUNITY): Payer: Self-pay | Admitting: Oral and Maxillofacial Surgery

## 2014-12-04 DIAGNOSIS — L0201 Cutaneous abscess of face: Secondary | ICD-10-CM

## 2014-12-04 DIAGNOSIS — L089 Local infection of the skin and subcutaneous tissue, unspecified: Secondary | ICD-10-CM

## 2014-12-05 ENCOUNTER — Encounter (HOSPITAL_COMMUNITY): Payer: Self-pay

## 2014-12-05 ENCOUNTER — Ambulatory Visit (HOSPITAL_COMMUNITY)
Admission: RE | Admit: 2014-12-05 | Discharge: 2014-12-05 | Disposition: A | Discharge status: Home / Self Care | Payer: BLUE CROSS/BLUE SHIELD | Source: Ambulatory Visit | Attending: Oral and Maxillofacial Surgery | Admitting: Oral and Maxillofacial Surgery

## 2014-12-05 DIAGNOSIS — M6008 Infective myositis, other site: Secondary | ICD-10-CM | POA: Diagnosis not present

## 2014-12-05 DIAGNOSIS — L0201 Cutaneous abscess of face: Secondary | ICD-10-CM | POA: Diagnosis present

## 2014-12-05 DIAGNOSIS — B998 Other infectious disease: Secondary | ICD-10-CM | POA: Diagnosis present

## 2014-12-05 DIAGNOSIS — L089 Local infection of the skin and subcutaneous tissue, unspecified: Secondary | ICD-10-CM

## 2014-12-05 MED ORDER — IOHEXOL 300 MG/ML  SOLN
75.0000 mL | Freq: Once | INTRAMUSCULAR | Status: AC | PRN
  Administered 2014-12-05: 75 mL via INTRAVENOUS

## 2014-12-07 ENCOUNTER — Encounter (HOSPITAL_COMMUNITY): Payer: Self-pay | Admitting: *Deleted

## 2014-12-07 NOTE — H&P (Signed)
Jesse Rodgers is an 39 y.o. male.   Chief Complaint: Left facial swelling  HPI: Jesse Rodgers was originally referred to our office by his general dentist with left facial swelling.  Dr. Mylinda Latina removed tooth #19 on 10/15/2014 subsequently he went to [facility] on 10/20/2014 and was sent home with antibiotic therapy and released.  He then went to [facility] on 10/23/14 and was discharged on 10/25/2014 still with left facial swelling.  I then treated the patient on 10/30/2014 with I&D extraorally (temporal space) and intraorally.  The patient's facial swelling still did not improve.  The patient then had #17 and a second I&D procedure performed in office on 11/07/2014.  The patient improved slightly and had a normalize white count and decreased fevers; however, the swelling remained.  Over this course the patient has taken clindamycin then penicillin and flagyl.  Due to the lack of improvement, I had the patient scheduled for a CT [facility] of the [facility]illofacial region.  Which showed concern for osteomyelitis of the left ramus and temporalis space infection.  There is also apical radiolucencies associated with #14 and #15 consistent with abscess and an impacted #16.  The patient will require extractions and drainage of the space along with a bone biopsy on the left ramus all in the operating room.  PMHx:  Past Medical History  Diagnosis Date  . Hyperlipidemia     takes Simvastatin daily  . Hypertension     takes Losartan daily  . Depression     takes Zoloft daily  . Arthritis     back   . History of kidney stones   . Diabetes mellitus without complication (HCC)     takes Toujeo and Novolog nightly;average fasting blood sugar runs 150    PSx:  Past Surgical History  Procedure Laterality Date  . Back surgery    . Abdominal surgery    . Appendectomy    . Partial colectomy      Family Hx: History reviewed. No pertinent family history.  Social History:  reports that he has been smoking.  He does not  have any smokeless tobacco history on file. He reports that he drinks alcohol. He reports that he does not use illicit drugs.  Allergies: No Known Allergies  Meds:  No prescriptions prior to admission    Labs: No results found for this or any previous visit (from the past 48 hour(s)).  Radiology: No results found.  ROS: Pertinent items are noted in HPI.  Vitals: Ht  (1.905 m)  Wt 83.915 kg (185 lb)  BMI 23.12 kg/m2  Physical Exam: General appearance: alert and cooperative Head: Normocephalic, without obvious abnormality, atraumatic Eyes: conjunctivae/corneas clear. PERRL, EOM's intact. Fundi benign. Ears: normal TM's and external ear canals both ears Nose: Nares normal. Septum midline. Mucosa normal. No drainage or sinus tenderness. Throat: abnormal findings: dentition: fair Resp: clear to auscultation bilaterally Cardio: regular rate and rhythm, S1, S2 normal, no murmur, click, rub or gallop GI: soft, non-tender; bowel sounds normal; no masses,  no organomegaly Extremities: extremities normal, atraumatic, no cyanosis or edema Pulses: 2+ and symmetric Neurologic: Alert and oriented X 3, normal strength and tone. Normal symmetric reflexes. Normal coordination and gait  Assessment/Plan Jesse Rodgers has a left temporal space abscess; carious teeth #14 and 15 with periapical abscess; and a full bony impacted #16  He will require bone biopsy; incision and drainage of the left temporalis space; and extraction of #14, 15, and 16.  Jesse Rodgers,Jesse Rodgers  12/07/2014, 5:09 PM

## 2014-12-07 NOTE — Progress Notes (Signed)
Requested orders from Dr.Denton's office.

## 2014-12-07 NOTE — Progress Notes (Signed)
   12/07/14 1149  OBSTRUCTIVE SLEEP APNEA  Have you ever been diagnosed with sleep apnea through a sleep study? No  Do you snore loudly (loud enough to be heard through closed doors)?  1  Do you often feel tired, fatigued, or sleepy during the daytime (such as falling asleep during driving or talking to someone)? 0  Has anyone observed you stop breathing during your sleep? 1  Do you have, or are you being treated for high blood pressure? 1  BMI more than 35 kg/m2? 0  Age > 50 (1-yes) 0  Neck circumference greater than:Male 16 inches or larger, Male 17inches or larger? 0  Male Gender (Yes=1) 1  Obstructive Sleep Apnea Score 4  Score 5 or greater  Results sent to PCP

## 2014-12-10 ENCOUNTER — Encounter (HOSPITAL_COMMUNITY): Payer: Self-pay | Admitting: Certified Registered Nurse Anesthetist

## 2014-12-10 ENCOUNTER — Ambulatory Visit (HOSPITAL_COMMUNITY): Payer: BLUE CROSS/BLUE SHIELD | Admitting: Anesthesiology

## 2014-12-10 ENCOUNTER — Inpatient Hospital Stay (HOSPITAL_COMMUNITY)
Admission: AD | Admit: 2014-12-10 | Discharge: 2014-12-12 | DRG: 137 | Disposition: A | Discharge status: Home / Self Care | Payer: BLUE CROSS/BLUE SHIELD | Source: Ambulatory Visit | Attending: Oral and Maxillofacial Surgery | Admitting: Oral and Maxillofacial Surgery

## 2014-12-10 ENCOUNTER — Encounter (HOSPITAL_COMMUNITY)
Admission: AD | Disposition: A | Discharge status: Home / Self Care | Payer: Self-pay | Source: Ambulatory Visit | Attending: Oral and Maxillofacial Surgery

## 2014-12-10 ENCOUNTER — Observation Stay (HOSPITAL_COMMUNITY): Payer: BLUE CROSS/BLUE SHIELD

## 2014-12-10 DIAGNOSIS — E785 Hyperlipidemia, unspecified: Secondary | ICD-10-CM | POA: Diagnosis present

## 2014-12-10 DIAGNOSIS — F329 Major depressive disorder, single episode, unspecified: Secondary | ICD-10-CM | POA: Diagnosis present

## 2014-12-10 DIAGNOSIS — K122 Cellulitis and abscess of mouth: Secondary | ICD-10-CM | POA: Diagnosis present

## 2014-12-10 DIAGNOSIS — E119 Type 2 diabetes mellitus without complications: Secondary | ICD-10-CM | POA: Diagnosis present

## 2014-12-10 DIAGNOSIS — K011 Impacted teeth: Secondary | ICD-10-CM | POA: Diagnosis present

## 2014-12-10 DIAGNOSIS — K047 Periapical abscess without sinus: Principal | ICD-10-CM | POA: Diagnosis present

## 2014-12-10 DIAGNOSIS — K029 Dental caries, unspecified: Secondary | ICD-10-CM | POA: Diagnosis present

## 2014-12-10 DIAGNOSIS — I10 Essential (primary) hypertension: Secondary | ICD-10-CM | POA: Diagnosis present

## 2014-12-10 DIAGNOSIS — F17200 Nicotine dependence, unspecified, uncomplicated: Secondary | ICD-10-CM | POA: Diagnosis present

## 2014-12-10 HISTORY — PX: DENTAL SURGERY: SHX609

## 2014-12-10 HISTORY — DX: Unspecified osteoarthritis, unspecified site: M19.90

## 2014-12-10 HISTORY — DX: Major depressive disorder, single episode, unspecified: F32.9

## 2014-12-10 HISTORY — DX: Personal history of urinary calculi: Z87.442

## 2014-12-10 HISTORY — DX: Depression, unspecified: F32.A

## 2014-12-10 HISTORY — PX: TOOTH EXTRACTION: SHX859

## 2014-12-10 LAB — GLUCOSE, CAPILLARY
GLUCOSE-CAPILLARY: 246 mg/dL — AB (ref 65–99)
GLUCOSE-CAPILLARY: 218 mg/dL — AB (ref 65–99)
GLUCOSE-CAPILLARY: 218 mg/dL — AB (ref 65–99)
Glucose-Capillary: 160 mg/dL — ABNORMAL HIGH (ref 65–99)
Glucose-Capillary: 171 mg/dL — ABNORMAL HIGH (ref 65–99)
Glucose-Capillary: 246 mg/dL — ABNORMAL HIGH (ref 65–99)

## 2014-12-10 LAB — CBC
HCT: 38.6 % — ABNORMAL LOW (ref 39.0–52.0)
HEMOGLOBIN: 13 g/dL (ref 13.0–17.0)
MCH: 30.4 pg (ref 26.0–34.0)
MCHC: 33.7 g/dL (ref 30.0–36.0)
MCV: 90.2 fL (ref 78.0–100.0)
PLATELETS: 274 10*3/uL (ref 150–400)
RBC: 4.28 MIL/uL (ref 4.22–5.81)
RDW: 13.2 % (ref 11.5–15.5)
WBC: 4.9 10*3/uL (ref 4.0–10.5)

## 2014-12-10 LAB — BASIC METABOLIC PANEL
Anion gap: 9 (ref 5–15)
BUN: 12 mg/dL (ref 6–20)
CALCIUM: 8.9 mg/dL (ref 8.9–10.3)
CHLORIDE: 103 mmol/L (ref 101–111)
CO2: 26 mmol/L (ref 22–32)
Creatinine, Ser: 0.72 mg/dL (ref 0.61–1.24)
GFR calc non Af Amer: >60 mL/min (ref >60)
Glucose, Bld: 237 mg/dL — ABNORMAL HIGH (ref 65–99)
Potassium: 4.9 mmol/L (ref 3.5–5.1)
SODIUM: 138 mmol/L (ref 135–145)

## 2014-12-10 SURGERY — EXTRACTION, TOOTH, MOLAR
Anesthesia: General | Site: Mouth

## 2014-12-10 MED ORDER — PROPOFOL 10 MG/ML IV BOLUS
INTRAVENOUS | Status: AC
  Filled 2014-12-10: qty 20

## 2014-12-10 MED ORDER — CLINDAMYCIN PHOSPHATE 300 MG/50ML IV SOLN
300.0000 mg | Freq: Three times a day (TID) | INTRAVENOUS | Status: DC
Start: 2014-12-10 — End: 2014-12-12
  Administered 2014-12-10 – 2014-12-12 (×5): 300 mg via INTRAVENOUS
  Filled 2014-12-10 (×7): qty 50

## 2014-12-10 MED ORDER — INSULIN ASPART 100 UNIT/ML ~~LOC~~ SOLN
1.0000 [IU] | SUBCUTANEOUS | Status: DC
  Administered 2014-12-10: 5 [IU] via SUBCUTANEOUS

## 2014-12-10 MED ORDER — OXYCODONE-ACETAMINOPHEN 5-325 MG PO TABS
1.0000 | ORAL_TABLET | Freq: Four times a day (QID) | ORAL | Status: DC | PRN
  Administered 2014-12-11 – 2014-12-12 (×6): 1 via ORAL
  Filled 2014-12-10 (×6): qty 1

## 2014-12-10 MED ORDER — OXYMETAZOLINE HCL 0.05 % NA SOLN
NASAL | Status: AC
  Filled 2014-12-10: qty 15

## 2014-12-10 MED ORDER — HYDROMORPHONE HCL 1 MG/ML IJ SOLN
1.0000 mg | INTRAMUSCULAR | Status: DC | PRN
  Administered 2014-12-10 – 2014-12-11 (×3): 1 mg via INTRAVENOUS
  Filled 2014-12-10 (×3): qty 1

## 2014-12-10 MED ORDER — SODIUM CHLORIDE 0.9 % IR SOLN
Status: DC | PRN
  Administered 2014-12-10: 500 mL

## 2014-12-10 MED ORDER — OXYMETAZOLINE HCL 0.05 % NA SOLN
NASAL | Status: DC | PRN
  Administered 2014-12-10: 1 via TOPICAL

## 2014-12-10 MED ORDER — GLYCOPYRROLATE 0.2 MG/ML IJ SOLN
INTRAMUSCULAR | Status: DC | PRN
  Administered 2014-12-10: 0.2 mg via INTRAVENOUS
  Administered 2014-12-10: 0.6 mg via INTRAVENOUS

## 2014-12-10 MED ORDER — WHITE PETROLATUM GEL
Status: AC
Start: 2014-12-10 — End: 2014-12-10
  Administered 2014-12-10: 22:00:00
  Filled 2014-12-10: qty 1

## 2014-12-10 MED ORDER — INSULIN GLARGINE 300 UNIT/ML ~~LOC~~ SOPN: 45.0000 [IU] | PEN_INJECTOR | Freq: Every day | SUBCUTANEOUS | Status: DC

## 2014-12-10 MED ORDER — LIDOCAINE-EPINEPHRINE 2 %-1:100000 IJ SOLN
INTRAMUSCULAR | Status: AC
  Filled 2014-12-10: qty 17

## 2014-12-10 MED ORDER — 0.9 % SODIUM CHLORIDE (POUR BTL) OPTIME
TOPICAL | Status: DC | PRN
  Administered 2014-12-10: 1000 mL

## 2014-12-10 MED ORDER — HYDROMORPHONE HCL 1 MG/ML IJ SOLN
INTRAMUSCULAR | Status: AC
  Filled 2014-12-10: qty 1

## 2014-12-10 MED ORDER — OXYMETAZOLINE HCL 0.05 % NA SOLN
NASAL | Status: DC | PRN
  Administered 2014-12-10 (×3): 1 via NASAL

## 2014-12-10 MED ORDER — KETOROLAC TROMETHAMINE 30 MG/ML IJ SOLN
30.0000 mg | Freq: Three times a day (TID) | INTRAMUSCULAR | Status: DC
  Administered 2014-12-10 – 2014-12-12 (×5): 30 mg via INTRAVENOUS
  Filled 2014-12-10 (×4): qty 1

## 2014-12-10 MED ORDER — ROCURONIUM BROMIDE 100 MG/10ML IV SOLN
INTRAVENOUS | Status: DC | PRN
Start: 2014-12-10 — End: 2014-12-10
  Administered 2014-12-10: 30 mg via INTRAVENOUS
  Administered 2014-12-10 (×2): 20 mg via INTRAVENOUS
  Administered 2014-12-10: 10 mg via INTRAVENOUS

## 2014-12-10 MED ORDER — MIDAZOLAM HCL 5 MG/5ML IJ SOLN
INTRAMUSCULAR | Status: DC | PRN
  Administered 2014-12-10: 2 mg via INTRAVENOUS

## 2014-12-10 MED ORDER — LOSARTAN POTASSIUM 50 MG PO TABS
25.0000 mg | ORAL_TABLET | Freq: Every day | ORAL | Status: DC
  Administered 2014-12-11 – 2014-12-12 (×2): 25 mg via ORAL
  Filled 2014-12-10 (×2): qty 1

## 2014-12-10 MED ORDER — PROPOFOL 10 MG/ML IV BOLUS
INTRAVENOUS | Status: DC | PRN
  Administered 2014-12-10: 140 mg via INTRAVENOUS
  Administered 2014-12-10: 30 mg via INTRAVENOUS

## 2014-12-10 MED ORDER — ONDANSETRON HCL 4 MG/2ML IJ SOLN
INTRAMUSCULAR | Status: AC
  Filled 2014-12-10: qty 2

## 2014-12-10 MED ORDER — LIDOCAINE HCL (PF) 2 % IJ SOLN
INTRAMUSCULAR | Status: DC | PRN
  Administered 2014-12-10: 2 mL

## 2014-12-10 MED ORDER — FENTANYL CITRATE (PF) 100 MCG/2ML IJ SOLN
INTRAMUSCULAR | Status: DC | PRN
  Administered 2014-12-10 (×10): 50 ug via INTRAVENOUS

## 2014-12-10 MED ORDER — INSULIN GLARGINE 100 UNIT/ML ~~LOC~~ SOLN
45.0000 [IU] | Freq: Every day | SUBCUTANEOUS | Status: DC
  Administered 2014-12-10 – 2014-12-11 (×2): 45 [IU] via SUBCUTANEOUS
  Filled 2014-12-10 (×3): qty 0.45

## 2014-12-10 MED ORDER — SENNOSIDES-DOCUSATE SODIUM 8.6-50 MG PO TABS: 1.0000 | ORAL_TABLET | Freq: Every evening | ORAL | Status: DC | PRN

## 2014-12-10 MED ORDER — ONDANSETRON HCL 4 MG/2ML IJ SOLN
INTRAMUSCULAR | Status: DC | PRN
  Administered 2014-12-10: 4 mg via INTRAVENOUS

## 2014-12-10 MED ORDER — FENTANYL CITRATE (PF) 250 MCG/5ML IJ SOLN
INTRAMUSCULAR | Status: AC
  Filled 2014-12-10: qty 5

## 2014-12-10 MED ORDER — GLYCOPYRROLATE 0.2 MG/ML IJ SOLN
INTRAMUSCULAR | Status: AC
  Filled 2014-12-10: qty 1

## 2014-12-10 MED ORDER — OXYCODONE-ACETAMINOPHEN 10-325 MG PO TABS: 1.0000 | ORAL_TABLET | Freq: Four times a day (QID) | ORAL | Status: DC | PRN

## 2014-12-10 MED ORDER — IOHEXOL 300 MG/ML  SOLN
75.0000 mL | Freq: Once | INTRAMUSCULAR | Status: AC | PRN
  Administered 2014-12-10: 75 mL via INTRAVENOUS

## 2014-12-10 MED ORDER — ROCURONIUM BROMIDE 50 MG/5ML IV SOLN
INTRAVENOUS | Status: AC
  Filled 2014-12-10: qty 3

## 2014-12-10 MED ORDER — LIDOCAINE HCL (CARDIAC) 20 MG/ML IV SOLN
INTRAVENOUS | Status: DC | PRN
  Administered 2014-12-10: 100 mg via INTRAVENOUS

## 2014-12-10 MED ORDER — ONDANSETRON HCL 4 MG/2ML IJ SOLN: 4.0000 mg | Freq: Four times a day (QID) | INTRAMUSCULAR | Status: DC | PRN

## 2014-12-10 MED ORDER — BUPIVACAINE-EPINEPHRINE (PF) 0.5% -1:200000 IJ SOLN
INTRAMUSCULAR | Status: AC
  Filled 2014-12-10: qty 10.8

## 2014-12-10 MED ORDER — SERTRALINE HCL 100 MG PO TABS
100.0000 mg | ORAL_TABLET | Freq: Every day | ORAL | Status: DC
  Administered 2014-12-11 – 2014-12-12 (×2): 100 mg via ORAL
  Filled 2014-12-10: qty 2
  Filled 2014-12-10: qty 1

## 2014-12-10 MED ORDER — DEXAMETHASONE SODIUM PHOSPHATE 4 MG/ML IJ SOLN
INTRAMUSCULAR | Status: AC
  Filled 2014-12-10: qty 2

## 2014-12-10 MED ORDER — NEOSTIGMINE METHYLSULFATE 10 MG/10ML IV SOLN
INTRAVENOUS | Status: DC | PRN
  Administered 2014-12-10: 4 mg via INTRAVENOUS

## 2014-12-10 MED ORDER — CEFAZOLIN SODIUM-DEXTROSE 2-3 GM-% IV SOLR
2.0000 g | INTRAVENOUS | Status: AC
  Administered 2014-12-10: 2 g via INTRAVENOUS

## 2014-12-10 MED ORDER — INSULIN ASPART 100 UNIT/ML ~~LOC~~ SOLN: 1.0000 [IU] | Freq: Three times a day (TID) | SUBCUTANEOUS | Status: DC

## 2014-12-10 MED ORDER — BUPIVACAINE-EPINEPHRINE 0.5% -1:200000 IJ SOLN
INTRAMUSCULAR | Status: DC | PRN
  Administered 2014-12-10: 2 mL

## 2014-12-10 MED ORDER — OXYCODONE HCL 5 MG PO TABS
5.0000 mg | ORAL_TABLET | Freq: Four times a day (QID) | ORAL | Status: DC | PRN
  Administered 2014-12-11 – 2014-12-12 (×6): 5 mg via ORAL
  Filled 2014-12-10 (×6): qty 1

## 2014-12-10 MED ORDER — ARTIFICIAL TEARS OP OINT
TOPICAL_OINTMENT | OPHTHALMIC | Status: DC | PRN
  Administered 2014-12-10: 1 via OPHTHALMIC

## 2014-12-10 MED ORDER — HYDROMORPHONE HCL 1 MG/ML IJ SOLN
0.2500 mg | INTRAMUSCULAR | Status: DC | PRN
  Administered 2014-12-10 (×4): 0.5 mg via INTRAVENOUS

## 2014-12-10 MED ORDER — MIDAZOLAM HCL 2 MG/2ML IJ SOLN
INTRAMUSCULAR | Status: AC
  Filled 2014-12-10: qty 4

## 2014-12-10 MED ORDER — DEXAMETHASONE SODIUM PHOSPHATE 4 MG/ML IJ SOLN
INTRAMUSCULAR | Status: DC | PRN
  Administered 2014-12-10: 8 mg via INTRAVENOUS

## 2014-12-10 MED ORDER — LACTATED RINGERS IV SOLN
INTRAVENOUS | Status: DC
  Administered 2014-12-10: 16:00:00 via INTRAVENOUS
  Administered 2014-12-10: 50 mL/h via INTRAVENOUS

## 2014-12-10 MED ORDER — LACTATED RINGERS IV SOLN: INTRAVENOUS | Status: DC

## 2014-12-10 MED ORDER — ROCURONIUM BROMIDE 50 MG/5ML IV SOLN
INTRAVENOUS | Status: AC
  Filled 2014-12-10: qty 1

## 2014-12-10 MED ORDER — MEPERIDINE HCL 25 MG/ML IJ SOLN: 6.2500 mg | INTRAMUSCULAR | Status: DC | PRN

## 2014-12-10 MED ORDER — PROMETHAZINE HCL 25 MG/ML IJ SOLN: 6.2500 mg | INTRAMUSCULAR | Status: DC | PRN

## 2014-12-10 MED ORDER — INSULIN ASPART 100 UNIT/ML ~~LOC~~ SOLN
0.0000 [IU] | SUBCUTANEOUS | Status: DC
  Administered 2014-12-10: 5 [IU] via SUBCUTANEOUS
  Administered 2014-12-11: 8 [IU] via SUBCUTANEOUS
  Administered 2014-12-11: 3 [IU] via SUBCUTANEOUS

## 2014-12-10 MED ORDER — KETOROLAC TROMETHAMINE 30 MG/ML IJ SOLN
INTRAMUSCULAR | Status: AC
  Filled 2014-12-10: qty 1

## 2014-12-10 MED ORDER — POTASSIUM CHLORIDE IN NACL 20-0.9 MEQ/L-% IV SOLN
INTRAVENOUS | Status: DC
  Administered 2014-12-10: 21:00:00 via INTRAVENOUS
  Administered 2014-12-11: 1000 mL via INTRAVENOUS
  Filled 2014-12-10 (×3): qty 1000

## 2014-12-10 MED ORDER — OXYCODONE-ACETAMINOPHEN 5-325 MG PO TABS
ORAL_TABLET | ORAL | Status: AC
  Filled 2014-12-10: qty 2

## 2014-12-10 MED ORDER — CEFAZOLIN SODIUM-DEXTROSE 2-3 GM-% IV SOLR
INTRAVENOUS | Status: AC
  Filled 2014-12-10: qty 50

## 2014-12-10 MED ORDER — SIMVASTATIN 40 MG PO TABS
40.0000 mg | ORAL_TABLET | Freq: Every day | ORAL | Status: DC
  Administered 2014-12-11 – 2014-12-12 (×2): 40 mg via ORAL
  Filled 2014-12-10 (×2): qty 1

## 2014-12-10 SURGICAL SUPPLY — 44 items
ALCOHOL ISOPROPYL (RUBBING) (MISCELLANEOUS) ×3 IMPLANT
ATTRACTOMAT 16X20 MAGNETIC DRP (DRAPES) ×3 IMPLANT
BLADE SURG 15 STRL LF DISP TIS (BLADE) ×2 IMPLANT
BLADE SURG 15 STRL SS (BLADE) ×6
BUR CROSS CUT FISSURE 1.6 (BURR) ×2 IMPLANT
BUR CROSS CUT FISSURE 1.6MM (BURR) ×1
BUR RND FLUTED 2.5 (BURR) ×1 IMPLANT
CANISTER SUCTION 2500CC (MISCELLANEOUS) ×3 IMPLANT
COVER SURGICAL LIGHT HANDLE (MISCELLANEOUS) ×3 IMPLANT
DRAIN PENROSE 1/4X12 LTX STRL (WOUND CARE) ×4 IMPLANT
DRSG EMULSION OIL 3X3 NADH (GAUZE/BANDAGES/DRESSINGS) ×2 IMPLANT
ELECT COATED BLADE 2.86 ST (ELECTRODE) IMPLANT
GAUZE PACKING FOLDED 2  STR (GAUZE/BANDAGES/DRESSINGS) ×2
GAUZE PACKING FOLDED 2 STR (GAUZE/BANDAGES/DRESSINGS) ×1 IMPLANT
GAUZE SPONGE 4X4 12PLY STRL (GAUZE/BANDAGES/DRESSINGS) ×2 IMPLANT
GLOVE BIOGEL PI IND STRL 6.5 (GLOVE) ×1 IMPLANT
GLOVE BIOGEL PI IND STRL 7.5 (GLOVE) ×1 IMPLANT
GLOVE BIOGEL PI INDICATOR 6.5 (GLOVE) ×2
GLOVE BIOGEL PI INDICATOR 7.5 (GLOVE) ×2
GLOVE ORTHO TXT STRL SZ7.5 (GLOVE) ×3 IMPLANT
GLOVE SURG SS PI 6.0 STRL IVOR (GLOVE) ×3 IMPLANT
GOWN STRL REUS W/ TWL LRG LVL3 (GOWN DISPOSABLE) ×2 IMPLANT
GOWN STRL REUS W/TWL LRG LVL3 (GOWN DISPOSABLE) ×6
KIT BASIN OR (CUSTOM PROCEDURE TRAY) ×3 IMPLANT
KIT ROOM TURNOVER OR (KITS) ×3 IMPLANT
NDL 18GX1X1/2 (RX/OR ONLY) (NEEDLE) IMPLANT
NDL BLUNT 16X1.5 OR ONLY (NEEDLE) ×1 IMPLANT
NDL DENTAL 27 LONG (NEEDLE) ×2 IMPLANT
NEEDLE 18GX1X1/2 (RX/OR ONLY) (NEEDLE) ×3 IMPLANT
NEEDLE BLUNT 16X1.5 OR ONLY (NEEDLE) ×3 IMPLANT
NEEDLE DENTAL 27 LONG (NEEDLE) ×6 IMPLANT
NS IRRIG 1000ML POUR BTL (IV SOLUTION) ×3 IMPLANT
PAD ARMBOARD 7.5X6 YLW CONV (MISCELLANEOUS) ×6 IMPLANT
PENCIL BUTTON HOLSTER BLD 10FT (ELECTRODE) IMPLANT
SOLUTION BETADINE 4OZ (MISCELLANEOUS) IMPLANT
SUT CHROMIC 3 0 PS 2 (SUTURE) ×3 IMPLANT
SUT SILK 2 0 FS (SUTURE) ×2 IMPLANT
SYR 50ML SLIP (SYRINGE) ×3 IMPLANT
SYR 5ML LL (SYRINGE) ×2 IMPLANT
SYRINGE 60CC LL (MISCELLANEOUS) ×4 IMPLANT
TAPE CLOTH SURG 4X10 WHT LF (GAUZE/BANDAGES/DRESSINGS) ×2 IMPLANT
TOOTHBRUSH ADULT (PERSONAL CARE ITEMS) ×3 IMPLANT
TOWEL OR 17X24 6PK STRL BLUE (TOWEL DISPOSABLE) ×3 IMPLANT
TRAY ENT MC OR (CUSTOM PROCEDURE TRAY) ×3 IMPLANT

## 2014-12-10 NOTE — Progress Notes (Signed)
Misty Stanley, pharmacist called to talk about pt's bedtime insulin.  Pt is refusing to eat, only want to take sips of liquids currently so pharmacist believes it may be best to start his bedtime insulin tomorrow night, which is scheduled on the [facility].  MD has ordered q4 CBGs and sliding scale to cover patient for now.  If pt's blood sugar bounces up, will reevaluate. Sherald Barge

## 2014-12-10 NOTE — Op Note (Signed)
12/10/2014  7:14 PM  PATIENT:  Jesse Rodgers  39 y.o. male  PRE-OPERATIVE DIAGNOSIS:  Carious #14, 15 with periapical abscess, Full bony impacted #16, Left Deep Temporal Space Infection  POST-OPERATIVE DIAGNOSIS:  Carious #14, 15 with periapical abscess, Full bony impacted #16, Left Deep Temporal Space Infection impacted teeth  PROCEDURE:  Procedure(s): EXTRACTION MOLARS #14,15,16  INTRAORAL AND EXTRAORAL INCISION AND DRAINAGE AND BONE BIOPSY OF LEFT RAMUS (N/A) AND DEEP TEMPORAL SPACE ABSCESS  INDICATIONS FOR PROCEDURE: Jesse Rodgers was originally referred to our office by his general dentist with left facial swelling. Dr. Mylinda Latina removed tooth #19 on 10/15/2014 subsequently he went to [facility] on 10/20/2014 and was sent home with antibiotic therapy and released. He then went to [facility] on 10/23/14 and was discharged on 10/25/2014 still with left facial swelling. I then treated the patient on 10/30/2014 with I&D extraorally (temporal space) and intraorally. The patient's facial swelling still did not improve. The patient then had #17 and a second I&D procedure performed in my office on 11/07/2014. The patient improved slightly and had a normalize white count and decreased fevers; however, the swelling remained. Over this course the patient has taken clindamycin then penicillin and flagyl. Due to the lack of improvement, I had the patient scheduled for a CT scan at [facility] of the maxillofacial region. Which showed concern for osteomyelitis of the left ramus and temporalis space abscess. There is also apical radiolucencies associated with #14 and #15 consistent with abscess and an impacted #16. The patient will require extractions and drainage of the space along with a bone biopsy on the left ramus all in the operating room.  SURGEON:  Surgeon(s) and Role:    * [name], DDS - Primary  PHYSICIAN ASSISTANT: None   ASSISTANTS: Everardo Pacific   PROCEDURE IN DETAIL: The  patient was seen in the preoperative area. All questions were answered the history and physical was updated and verified.  The consent was reviewed and signed .  The patient was taken to the operating room by the anesthesia service.   Patient was placed on the table in the supine position and intubated nasally. The patient was prepped and draped in the usual sterile fashion for all maxillofacial surgery procedures.  A moisten raytec was placed in the patient oropharynx.  Local anesthetic was placed in the left temporal region and in the left mandible.  The patient's opening was limited even though he was under general anesthesia.  The previous incision site in the left temporal region was re-opened using a 15 blade.  Blunt dissection was carried out down to the deep temporal layer with a hemostat.  Minimal purulence was expressed.    Then our attention was turned intra orally.  A 15 blade was used to make a full thickness incision along the buccal of #14, 15, and 16 and these teeth were then removed after removing bone with the drill and hand instruments.  Copious irrigation with normal saline was performed and then 3.0 chromic was used to close the wound.  Also a BSSO type incision was created at the left ramus and a drill was used to take a bone biopsy of the ramus.  This was sent to pathology to rule out osteomyelitis.  The dissection was carried superiorly into the infratemporal fossa and then medial to the left zygomatic arch and into the the deep temporal space.  Only minimal purulence was expressed.  Three penrose drains were then placed (two extraoral -  superficial temporal and deep temporal and one intraoral into the inferior of deep temporal space). These were secured with 2-0 silk suture.   All counts were correct times two.  Patient was extubated and taken to the PACU were she recovered well.    ANESTHESIA:   general  EBL:   minimal  BLOOD ADMINISTERED:none  DRAINS: Penrose drain in the Left  Temporalis Space x 2 extraoral , and Left Temporalis Space intraoral    LOCAL MEDICATIONS USED:  MARCAINE and LIDOCAINE   SPECIMEN:  Source of Specimen:  left mandibular ramus  DISPOSITION OF SPECIMEN:  PATHOLOGY  COUNTS:  YES  TOURNIQUET:  * No tourniquets in log *  DICTATION: .Note written in EPIC  PLAN OF CARE: Admit to inpatient   PATIENT DISPOSITION:  PACU - hemodynamically stable.   Delay start of Pharmacological VTE agent (>24hrs) due to surgical blood loss or risk of bleeding: not applicable

## 2014-12-10 NOTE — Interval H&P Note (Signed)
History and Physical Interval Note:  12/10/2014 3:47 PM  Jesse Rodgers  has presented today for surgery, with the diagnosis of cellulitis and abscess of mouth periapical abscess without sinus impacted teeth  The various methods of treatment have been discussed with the patient and family. After consideration of risks, benefits and other options for treatment, the patient has consented to  Procedure(s): EXTRACTION MOLARS #14,15,16  INTRAORAL AND EXTRAORAL INCISION AND DRAINAGE AND BONE BIOPSY OF LEFT RAMUS (N/A) as a surgical intervention .  The patient's history has been reviewed, patient examined, no change in status, stable for surgery.  I have reviewed the patient's chart and labs.  Questions were answered to the patient's satisfaction.     [place],Keyla Milone L

## 2014-12-10 NOTE — Anesthesia Preprocedure Evaluation (Addendum)
Anesthesia Evaluation  Patient identified by MRN, date of birth, ID band Patient awake    Reviewed: Allergy & Precautions, NPO status , Patient's Chart, lab work & pertinent test results  Airway Mallampati: IV     Mouth opening: Limited Mouth Opening  Dental  (+) Poor Dentition   Pulmonary Current Smoker,    breath sounds clear to auscultation       Cardiovascular hypertension, Pt. on medications  Rhythm:Regular Rate:Normal     Neuro/Psych PSYCHIATRIC DISORDERS Depression  Neuromuscular disease    GI/Hepatic negative GI ROS, Neg liver ROS,   Endo/Other  diabetes, Insulin Dependent  Renal/GU negative Renal ROS     Musculoskeletal  (+) Arthritis , Osteoarthritis,    Abdominal   Peds  Hematology negative hematology ROS (+)   Anesthesia Other Findings   Reproductive/Obstetrics negative OB ROS                          Lab Results  Component Value Date   WBC 4.9 12/10/2014   HGB 13.0 12/10/2014   HCT 38.6* 12/10/2014   MCV 90.2 12/10/2014   PLT 274 12/10/2014   Lab Results  Component Value Date   CREATININE 0.63 10/25/2014   BUN 11 10/25/2014   NA 140 10/25/2014   K 4.0 10/25/2014   CL 105 10/25/2014   CO2 28 10/25/2014   No results found for: INR, PROTIME  EKG: normal sinus rhythm.   Anesthesia Physical Anesthesia Plan  ASA: III  Anesthesia Plan: General   Post-op Pain Management:    Induction: Intravenous  Airway Management Planned: Nasal ETT and Fiberoptic Intubation Planned  Additional Equipment:   Intra-op Plan:   Post-operative Plan: Extubation in OR  Informed Consent: I have reviewed the patients History and Physical, chart, labs and discussed the procedure including the risks, benefits and alternatives for the proposed anesthesia with the patient or authorized representative who has indicated his/her understanding and acceptance.   Dental advisory  given  Plan Discussed with:   Anesthesia Plan Comments:        Anesthesia Quick Evaluation

## 2014-12-10 NOTE — Brief Op Note (Signed)
12/10/2014  7:11 PM  PATIENT:  Jesse Rodgers  39 y.o. male  PRE-OPERATIVE DIAGNOSIS:  Carious #14, 15 with periapical abscess, Full bony impacted #16, Left Deep Temporal Space Infection  POST-OPERATIVE DIAGNOSIS:  Carious #14, 15 with periapical abscess, Full bony impacted #16, Left Deep Temporal Space Infection impacted teeth  PROCEDURE:  Procedure(s): EXTRACTION MOLARS #14,15,16  INTRAORAL AND EXTRAORAL INCISION AND DRAINAGE AND BONE BIOPSY OF LEFT RAMUS (N/A) AND DEEP TEMPORAL SPACE ABSCESS  SURGEON:  Surgeon(s) and Role:    * [name], DDS - Primary  PHYSICIAN ASSISTANT: None   ASSISTANTS: Everardo Pacific     ANESTHESIA:   general  EBL:     BLOOD ADMINISTERED:none  DRAINS: Penrose drain in the Left Temporalis Space x 2 extraoral , and Left Temporalis Space intraoral    LOCAL MEDICATIONS USED:  MARCAINE and LIDOCAINE   SPECIMEN:  Source of Specimen:  left mandibular ramus  DISPOSITION OF SPECIMEN:  PATHOLOGY  COUNTS:  YES  TOURNIQUET:  * No tourniquets in log *  DICTATION: .Note written in EPIC  PLAN OF CARE: Admit to inpatient   PATIENT DISPOSITION:  PACU - hemodynamically stable.   Delay start of Pharmacological VTE agent (>24hrs) due to surgical blood loss or risk of bleeding: not applicable

## 2014-12-10 NOTE — Progress Notes (Signed)
Pt able to eat some applesauce and chicken noodle soup.  Pt stated he normally takes his bedtime insulin regardless of how much he has eaten throughout the day.  Pharmacy called and they are scheduling lantus dose for him to take tonight. Will continue to monitor. Sherald Barge

## 2014-12-10 NOTE — Transfer of Care (Signed)
Immediate Anesthesia Transfer of Care Note  Patient: Jesse Rodgers  Procedure(s) Performed: Procedure(s): EXTRACTION MOLARS #14,15,16  INTRAORAL AND EXTRAORAL INCISION AND DRAINAGE AND BONE BIOPSY OF LEFT RAMUS (N/A)  Patient Location: PACU  Anesthesia Type:General  Level of Consciousness: awake, alert , oriented and patient cooperative  Airway & Oxygen Therapy: Patient Spontanous Breathing and Patient connected to face mask oxygen  Post-op Assessment: Report given to RN, Post -op Vital signs reviewed and stable and Patient moving all extremities  Post vital signs: Reviewed and stable  Last Vitals:  BP 139/69 HR 84 SpO2 100 on 4L FM RR 11  Complications: No apparent anesthesia complications

## 2014-12-10 NOTE — Anesthesia Procedure Notes (Signed)
Procedure Name: Intubation Date/Time: 12/10/2014 4:17 PM Performed by: Roney Mans P Pre-anesthesia Checklist: Patient identified, Timeout performed, Emergency Drugs available, Suction available and Patient being monitored Patient Re-evaluated:Patient Re-evaluated prior to inductionOxygen Delivery Method: Circle system utilized Preoxygenation: Pre-oxygenation with 100% oxygen Intubation Type: IV induction Ventilation: Mask ventilation without difficulty and Nasal airway inserted- appropriate to patient size Nasal Tubes: Left and Nasal prep performed Tube size: 7.5 mm Number of attempts: 1 Airway Equipment and Method: Fiberoptic brochoscope Placement Confirmation: ETT inserted through vocal cords under direct vision,  breath sounds checked- equal and bilateral and positive ETCO2 Secured at: 28 cm Tube secured with: Tape Dental Injury: Teeth and Oropharynx as per pre-operative assessment  Difficulty Due To: Difficulty was anticipated, Difficult Airway-  due to edematous airway and Difficult Airway- due to limited oral opening Future Recommendations: Recommend- induction with short-acting agent, and alternative techniques readily available and Recommend- awake intubation Comments: Nasal prep with oxymetazoline spray and sequential lubricated NPA's 7.0, 7.5. Endotracheal tube well lubricated. Nasal intubation by Dr Hart [place] using fiberoptic scope.

## 2014-12-10 NOTE — Anesthesia Postprocedure Evaluation (Signed)
Anesthesia Post Note  Patient: Jesse Rodgers  Procedure(s) Performed: Procedure(s) (LRB): EXTRACTION MOLARS #14,15,16  INTRAORAL AND EXTRAORAL INCISION AND DRAINAGE AND BONE BIOPSY OF LEFT RAMUS (N/A)  Anesthesia type: general  Patient location: PACU  Post pain: Pain level controlled  Post assessment: Patient's Cardiovascular Status Stable  Last Vitals:  Filed Vitals:   12/10/14 1930  BP: 136/66  Pulse: 95  Temp:   Resp:     Post vital signs: Reviewed and stable  Level of consciousness: sedated  Complications: No apparent anesthesia complications

## 2014-12-11 ENCOUNTER — Encounter (HOSPITAL_COMMUNITY): Payer: Self-pay | Admitting: General Practice

## 2014-12-11 DIAGNOSIS — K011 Impacted teeth: Secondary | ICD-10-CM | POA: Diagnosis present

## 2014-12-11 DIAGNOSIS — K029 Dental caries, unspecified: Secondary | ICD-10-CM | POA: Diagnosis present

## 2014-12-11 DIAGNOSIS — I10 Essential (primary) hypertension: Secondary | ICD-10-CM | POA: Diagnosis present

## 2014-12-11 DIAGNOSIS — K122 Cellulitis and abscess of mouth: Secondary | ICD-10-CM | POA: Diagnosis present

## 2014-12-11 DIAGNOSIS — F329 Major depressive disorder, single episode, unspecified: Secondary | ICD-10-CM | POA: Diagnosis present

## 2014-12-11 DIAGNOSIS — E119 Type 2 diabetes mellitus without complications: Secondary | ICD-10-CM | POA: Diagnosis present

## 2014-12-11 DIAGNOSIS — R220 Localized swelling, mass and lump, head: Secondary | ICD-10-CM | POA: Diagnosis present

## 2014-12-11 DIAGNOSIS — F17200 Nicotine dependence, unspecified, uncomplicated: Secondary | ICD-10-CM | POA: Diagnosis present

## 2014-12-11 DIAGNOSIS — K047 Periapical abscess without sinus: Secondary | ICD-10-CM | POA: Diagnosis present

## 2014-12-11 DIAGNOSIS — E785 Hyperlipidemia, unspecified: Secondary | ICD-10-CM | POA: Diagnosis present

## 2014-12-11 LAB — GLUCOSE, CAPILLARY
GLUCOSE-CAPILLARY: 164 mg/dL — AB (ref 65–99)
GLUCOSE-CAPILLARY: 267 mg/dL — AB (ref 65–99)
GLUCOSE-CAPILLARY: 168 mg/dL — AB (ref 65–99)
Glucose-Capillary: 279 mg/dL — ABNORMAL HIGH (ref 65–99)
Glucose-Capillary: 271 mg/dL — ABNORMAL HIGH (ref 65–99)

## 2014-12-11 LAB — CBC
HCT: 33.8 % — ABNORMAL LOW (ref 39.0–52.0)
HEMOGLOBIN: 11.3 g/dL — AB (ref 13.0–17.0)
MCH: 30.4 pg (ref 26.0–34.0)
MCHC: 33.4 g/dL (ref 30.0–36.0)
MCV: 90.9 fL (ref 78.0–100.0)
PLATELETS: 280 10*3/uL (ref 150–400)
RBC: 3.72 MIL/uL — ABNORMAL LOW (ref 4.22–5.81)
RDW: 13.1 % (ref 11.5–15.5)
WBC: 7.4 10*3/uL (ref 4.0–10.5)

## 2014-12-11 LAB — BASIC METABOLIC PANEL
Anion gap: 9 (ref 5–15)
BUN: 14 mg/dL (ref 6–20)
CALCIUM: 8.7 mg/dL — AB (ref 8.9–10.3)
CHLORIDE: 102 mmol/L (ref 101–111)
CO2: 28 mmol/L (ref 22–32)
CREATININE: 0.64 mg/dL (ref 0.61–1.24)
Glucose, Bld: 148 mg/dL — ABNORMAL HIGH (ref 65–99)
Potassium: 4.3 mmol/L (ref 3.5–5.1)
SODIUM: 139 mmol/L (ref 135–145)

## 2014-12-11 MED ORDER — INSULIN ASPART 100 UNIT/ML ~~LOC~~ SOLN
0.0000 [IU] | Freq: Every day | SUBCUTANEOUS | Status: DC
  Administered 2014-12-11: 3 [IU] via SUBCUTANEOUS

## 2014-12-11 MED ORDER — INSULIN ASPART 100 UNIT/ML ~~LOC~~ SOLN
1.0000 [IU] | Freq: Three times a day (TID) | SUBCUTANEOUS | Status: DC
  Administered 2014-12-11: 5 [IU] via SUBCUTANEOUS

## 2014-12-11 MED ORDER — INSULIN ASPART 100 UNIT/ML ~~LOC~~ SOLN
0.0000 [IU] | Freq: Three times a day (TID) | SUBCUTANEOUS | Status: DC
  Administered 2014-12-11: 3 [IU] via SUBCUTANEOUS

## 2014-12-11 NOTE — Progress Notes (Signed)
Around 10AM, patient asked for CBG to be rechecked. CBG was 279 and he requested Insulin. RN paged Dr. Jeanice Lim for advice and he said is OK to give 8 units Novolog per sliding scale. When RN went to give Insulin, patient refused saying that is too late, now. Patient was monitored and there where no S/S of hyper/hypoglycemia. Will continue to monitor.

## 2014-12-11 NOTE — Care Management (Signed)
Referral for medication assistance , spoke with patient he does have prescription coverage , therefore not eligible for [facility] letter. Unable to assist with co pays .  Patient has PCP . Ronny Flurry RN BSN [id number]

## 2014-12-11 NOTE — Progress Notes (Signed)
Jesse Rodgers is a 39 y.o. male patient POD 1  S/P Extraction of #14, 15, 16; I&D of deep temporal space infection; and bone biopsy of the left mandibular ramus.  Diagnosis:  1. Dental abscess     PMHx:  Past Medical History  Diagnosis Date  . Hyperlipidemia     takes Simvastatin daily  . Hypertension     takes Losartan daily  . Depression     takes Zoloft daily  . Arthritis     back   . History of kidney stones   . Diabetes mellitus without complication (HCC)     takes Toujeo and Novolog nightly;average fasting blood sugar runs 150    Meds:  Current Facility-Administered Medications  Medication Dose Route Frequency Provider Last Rate Last Dose  . 0.9 % NaCl with KCl 20 mEq/ L  infusion   Intravenous Continuous [name], DDS 50 mL/hr at 12/10/14 2036    . clindamycin (CLEOCIN) IVPB 300 mg  300 mg Intravenous 3 times per day [facility], DDS   300 mg at 12/11/14 4098  . HYDROmorphone (DILAUDID) 1 MG/ML injection           . HYDROmorphone (DILAUDID) injection 1 mg  1 mg Intravenous Q2H PRN [name], DDS   1 mg at 12/10/14 2353  . insulin aspart (novoLOG) injection 0-15 Units  0-15 Units Subcutaneous 6 times per day [facility], DDS   3 Units at 12/11/14 0331  . insulin glargine (LANTUS) injection 45 Units  45 Units Subcutaneous QHS [facility], DDS   45 Units at 12/10/14 2352  . ketorolac (TORADOL) 30 MG/ML injection 30 mg  30 mg Intravenous 3 times per day [facility], DDS   30 mg at 12/11/14 0512  . ketorolac (TORADOL) 30 MG/ML injection           . lactated ringers infusion   Intravenous Continuous Val Eagle, MD 50 mL/hr at 12/10/14 1316    . losartan (COZAAR) tablet 25 mg  25 mg Oral Daily [place], DDS   25 mg at 12/10/14 2106  . ondansetron (ZOFRAN) injection 4 mg  4 mg Intravenous Q6H PRN [name], DDS      . oxyCODONE-acetaminophen (PERCOCET/ROXICET) 5-325 MG per tablet 1 tablet  1 tablet Oral Q6H  PRN [name], DDS   1 tablet at 12/11/14 0125   And  . oxyCODONE (Oxy IR/ROXICODONE) immediate release tablet 5 mg  5 mg Oral Q6H PRN [name], DDS   5 mg at 12/11/14 0125  . senna-docusate (Senokot-S) tablet 1 tablet  1 tablet Oral QHS PRN [name], DDS      . sertraline (ZOLOFT) tablet 100 mg  100 mg Oral Daily [name], DDS      . simvastatin (ZOCOR) tablet 40 mg  40 mg Oral Daily [facility], DDS        Allergies: No Known Allergies  Problems: Active Problems:   Dental abscess   Vitals: BP 104/57 mmHg  Pulse 70  Temp(Src) 98.2 F (36.8 C) (Oral)  Resp 20  Ht  (1.905 m)  Wt 83.915 kg (185 lb)  BMI 23.12 kg/m2  SpO2 97%  Labs:   CBC Latest Ref Rng 12/11/2014 12/10/2014 10/25/2014  WBC 4.0 - 10.5 K/uL 7.4 4.9 10.8(H)  Hemoglobin 13.0 - 17.0 g/dL 11.3(L) 13.0 10.7(L)  Hematocrit 39.0 - 52.0 % 33.8(L) 38.6(L) 31.7(L)  Platelets 150 - 400 K/uL 280 274 252   BMP Latest Ref Rng 12/11/2014 12/10/2014  10/25/2014  Glucose 65 - 99 mg/dL 161(W) 960(A) 70  BUN 6 - 20 mg/dL Creatinine 0.61 - 1.24 mg/dL 5.40 9.81 1.91  Sodium 135 - 145 mmol/L 139 138 140  Potassium 3.5 - 5.1 mmol/L 4.3 4.9 4.0  Chloride 101 - 111 mmol/L 102 103 105  CO2 22 - 32 mmol/L Calcium 8.9 - 10.3 mg/dL 4.7(W) 8.9 8.0(L)      Radiology: Ct Maxillofacial W/cm  12/10/2014   CLINICAL DATA:  Patient with history of left-sided dental infection, abscess. Status post surgical debridement with drain placement today. Verify drain placement.  EXAM: CT MAXILLOFACIAL WITH CONTRAST  TECHNIQUE: Multidetector CT imaging of the maxillofacial structures was performed with intravenous contrast. Multiplanar CT image reconstructions were also generated. A small metallic BB was placed on the right temple in order to reliably differentiate right from left.  CONTRAST:  75 cc of Omni 300.  COMPARISON:  Prior CT from 12/05/2014.  FINDINGS: Visualized portions of  the brain demonstrate no acute abnormality. Globes and orbits demonstrate no acute abnormality.  Scattered mucosal thickening within the frontoethmoidal recesses and ethmoidal sinuses as well as the maxillary sinuses. No air-fluid levels to suggest active sinus infection. Mastoid air cells are clear. Middle ear cavities are clear.  Teeth number 14, 15, and 16 have been extracted. There has been interval placement of an intraoral drain, proximal aspect which extends into the pocket of the removed right first mandibular molar. The drain extends superiorly and posteriorly adjacent to the core cord process of the mandibular ramus with tip coiled in the infratemporal fossa. A second drain extends through the skin of the left temporal region in is coiled in the left temporalis muscle. No residual rim enhancing fluid collection now identified. There is persistent soft tissue swelling with inflammatory fat stranding within the left face, consistent with cellulitis. Retro antral fat on the left remains obliterated. Vague hypodensity adjacent to the left mandibular ramus on coronal sequence (series 7, image 51) more consistent with phlegmon rather than frank abscess at this point. Scattered soft tissue emphysema likely postsurgical in nature.  Shotty subcentimeter adenopathy present within the upper neck. Mildly prominent left level 1B node measuring 9 mm likely reactive in nature.  No retropharyngeal fluid collection. Epiglottis normal. Vallecular cleared visualized topple pharynx and supraglottic larynx demonstrate no acute abnormality.  IMPRESSION: Postoperative changes from interval tooth extraction with debridement of left-sided odontogenic and deep fascial abscesses. Surgical drains in place as above. Changes of persistent cellulitis present within the left masticator space with no definable or rim enhancing abscess now identified.   Electronically Signed   By: Rise Mu M.D.   On: 12/10/2014 22:12     Exam: The patient's cranial nerves V, VII are intact, the two temporal space drains are in place extraorally; the dressing is in place with minimal output.  Intraorally there is minimal output from the drain into the bottom the temporal space.  The extraction sites are hemostatic and sutures are in place.  His opening is limited to about 15 mm.     A/P: Mr. Denomme is doing well s/p above.  He has been afebrile and his WBC level is within normal limits. 1. Continue IV antibiotics  2. The patient can have a pureed diet 3. Monitor blood glucose and adjust with SSI  4.  Will likely discharge tomorrow am.   [place],Tai Syfert L 12/11/2014, 7:17 AM

## 2014-12-11 NOTE — Progress Notes (Addendum)
Inpatient Diabetes Program Recommendations  AACE/ADA: New Consensus Statement on Inpatient Glycemic Control (2015)  Target Ranges:  Prepandial:   less than 140 mg/dL      Peak postprandial:   less than 180 mg/dL (1-2 hours)      Critically ill patients:  140 - 180 mg/dL    Results for Jesse Rodgers, Jesse Rodgers (MRN [id number]) as of 12/11/2014 14:33  Ref. Range 12/10/2014 23:47 12/11/2014 03:27 12/11/2014 09:21 12/11/2014 12:07  Glucose-Capillary Latest Ref Range: 65-99 mg/dL 409 (H) 811 (H) 914 (H) 267 (H)    Admit with: Temporal space abscess; Carious teeth #14 and 15 with periapical abscess  History: Type 1 DM  Home DM Meds: Toujeo 45 units QHS       Novolog SSI       Novolog Carbohydrate Coverage (based on carbohydrates patient consumes with each meal)  Current Insulin Orders: Lantus 45 units QHS      Novolog Moderate SSI Q4 hours    -Spoke with patient this afternoon about his home insulin regimen.  Per patient, he takes Toujeo insulin 45 units QHS and also uses Novolog to both correct his high blood sugars and to cover his carbohydrate intake with meals.  Per patient, he will usually gauge his meals based on the amount of carbohydrates.  Per patient, if it is a lower carbohydrate meal he will give himself about 6 units Novolog if his pre-meal CBG is WNL.  He will give himself about 8-10 units with a larger carbohydrate meal.  -Recommend we change Novolog Moderate SSI to TID AC + HS (currently ordered as Q4 hours and patient is now eating PO diet)  -Recommend we also start a Novolog Meal Coverage for patient.  Recommend we cover patient's carbohydrates on a 1:10 ratio (1 unit Novolog for every 10 grams of carbohydrates eaten by patient).    -Spoke with representative from Dr. Deirdre Peer office.  Per Dr. Deirdre Peer rep (after rep spoke with Dr. Jeanice Lim), OK to make the above insulin changes for patient.  Changes made and new orders reviewed with RN caring for patient.    ----Will follow  patient during hospitalization----  Ambrose Finland RN, MSN, CDE Diabetes Coordinator Inpatient Glycemic Control Team Team Pager: [phone] (8a-5p)

## 2014-12-12 LAB — GLUCOSE, CAPILLARY: Glucose-Capillary: 34 mg/dL — CL (ref 65–99)

## 2014-12-12 LAB — BASIC METABOLIC PANEL
Anion gap: 13 (ref 5–15)
BUN: 12 mg/dL (ref 6–20)
CALCIUM: 8.9 mg/dL (ref 8.9–10.3)
CHLORIDE: 101 mmol/L (ref 101–111)
CO2: 29 mmol/L (ref 22–32)
CREATININE: 0.66 mg/dL (ref 0.61–1.24)
GFR calc Af Amer: >60 mL/min (ref >60)
GFR calc non Af Amer: >60 mL/min (ref >60)
GLUCOSE: 74 mg/dL (ref 65–99)
Potassium: 4 mmol/L (ref 3.5–5.1)
Sodium: 143 mmol/L (ref 135–145)

## 2014-12-12 LAB — CBC
HEMATOCRIT: 32 % — AB (ref 39.0–52.0)
HEMOGLOBIN: 10.6 g/dL — AB (ref 13.0–17.0)
MCH: 30.3 pg (ref 26.0–34.0)
MCHC: 33.1 g/dL (ref 30.0–36.0)
MCV: 91.4 fL (ref 78.0–100.0)
Platelets: 248 10*3/uL (ref 150–400)
RBC: 3.5 MIL/uL — AB (ref 4.22–5.81)
RDW: 13.1 % (ref 11.5–15.5)
WBC: 6.3 10*3/uL (ref 4.0–10.5)

## 2014-12-12 NOTE — Discharge Instructions (Signed)
HOME CARE INSTRUCTIONS DENTAL PROCEDURES  MEDICATION: Some soreness and discomfort is normal following a dental procedure.  Use of a non-aspirin pain product, like acetaminophen, is recommended.  If pain is not relieved, please call the surgeon who performed the procedure.  ORAL HYGIENE: Brushing of the teeth should be resumed the day after surgery.  Begin slowly and softly.  In children, brushing should be done by the parent after every meal.  DIET: A balanced diet is very important during the healing process.   Liquids and soft foods are advisable.  Drink clear liquids at first, then progress to other liquids as tolerated.  If teeth were removed, do not use a straw for at least 2 days.  Try to limit between-meal snacks which are high in sugar.  ACTIVITY: Limit to quiet indoor activities for 24 hours following surgery.  RETURN TO SCHOOL OR WORK: You may return to school or work in a day or two, or as indicated by Designer, industrial/productyour surgeon.  GENERAL EXPECTATIONS:  -Bleeding is to be expected after teeth are removed.  The bleeding should slow down after several hours.  -Stitches may be in place, which will fall out by themselves.  If the child pulls them out, do not be concerned.  CALL YOUR DOCTOR IS THESE OCCUR:  -Temperature is 101 degrees or more.  -Persistent bright red bleeding.  -Severe pain.  Return to the doctor's office in two days Call to make an appointment.  Patient Signature:  ________________________________________________________  Nurse's Signature:  ________________________________________________________

## 2014-12-12 NOTE — Progress Notes (Signed)
hypoglycemic with cbg at 34, asymptomatic, snack given, doctor on floor and aware. Rechecked after snack cbg was 128. Patient is to be discharged today.

## 2014-12-12 NOTE — Progress Notes (Addendum)
Discharge instructions gone over with patient.  Home medications gone over. Patient is to make follow up visit with doctor and go by his office upon discharge.  Prescriptions will be given at the office.  Diet , activity, and reasons to call the doctor gone over. Patient verbalized understanding of instructions.

## 2014-12-12 NOTE — Progress Notes (Signed)
Jesse Rodgers is a 39 y.o. male patient POD 2 S/P Extraction of #14, 15, 16; I&D of deep temporal space infection; and bone biopsy of the left mandibular ramus.  Diagnosis:  1. Dental abscess     PMHx:  Past Medical History  Diagnosis Date  . Hyperlipidemia     takes Simvastatin daily  . Hypertension     takes Losartan daily  . Depression     takes Zoloft daily  . Arthritis     back   . History of kidney stones   . Diabetes mellitus without complication (HCC)     takes Toujeo and Novolog nightly;average fasting blood sugar runs 150    Meds:  Current Facility-Administered Medications  Medication Dose Route Frequency Provider Last Rate Last Dose  . 0.9 % NaCl with KCl 20 mEq/ L  infusion   Intravenous Continuous [facility], DDS 50 mL/hr at 12/11/14 1640 1,000 mL at 12/11/14 1640  . clindamycin (CLEOCIN) IVPB 300 mg  300 mg Intravenous 3 times per day [facility], DDS   300 mg at 12/12/14 0529  . HYDROmorphone (DILAUDID) injection 1 mg  1 mg Intravenous Q2H PRN [name], DDS   1 mg at 12/11/14 2137  . insulin aspart (novoLOG) injection 0-15 Units  0-15 Units Subcutaneous TID [facility] [name], DDS   3 Units at 12/11/14 1754  . insulin aspart (novoLOG) injection 0-5 Units  0-5 Units Subcutaneous QHS [name], DDS   3 Units at 12/11/14 2304  . insulin aspart (novoLOG) injection 1-10 Units  1-10 Units Subcutaneous TID [facility] [name], DDS   5 Units at 12/11/14 1758  . insulin glargine (LANTUS) injection 45 Units  45 Units Subcutaneous QHS [facility], DDS   45 Units at 12/11/14 2304  . ketorolac (TORADOL) 30 MG/ML injection 30 mg  30 mg Intravenous 3 times per day [facility], DDS   30 mg at 12/12/14 0530  . lactated ringers infusion   Intravenous Continuous Val Eagle, MD 50 mL/hr at 12/10/14 1316    . losartan (COZAAR) tablet 25 mg  25 mg Oral Daily [facility], DDS   25 mg at 12/11/14 1020  . ondansetron  (ZOFRAN) injection 4 mg  4 mg Intravenous Q6H PRN [name], DDS      . oxyCODONE-acetaminophen (PERCOCET/ROXICET) 5-325 MG per tablet 1 tablet  1 tablet Oral Q6H PRN [name], DDS   1 tablet at 12/12/14 0530   And  . oxyCODONE (Oxy IR/ROXICODONE) immediate release tablet 5 mg  5 mg Oral Q6H PRN [name], DDS   5 mg at 12/12/14 0530  . senna-docusate (Senokot-S) tablet 1 tablet  1 tablet Oral QHS PRN [name], DDS      . sertraline (ZOLOFT) tablet 100 mg  100 mg Oral Daily [facility], DDS   100 mg at 12/11/14 1020  . simvastatin (ZOCOR) tablet 40 mg  40 mg Oral Daily [facility], DDS   40 mg at 12/11/14 1020    Allergies: No Known Allergies  Problems: Active Problems:   Dental abscess   Vitals: BP 119/72 mmHg  Pulse 64  Temp(Src) 98.4 F (36.9 C) (Oral)  Resp 17  Ht  (1.905 m)  Wt 83.915 kg (185 lb)  BMI 23.12 kg/m2  SpO2 97%  Labs:  BMP Latest Ref Rng 12/12/2014 12/11/2014 12/10/2014  Glucose 65 - 99 mg/dL 74 161(W) 960(A)  BUN 6 - 20 mg/dL Creatinine 0.61 - 1.24 mg/dL  0.66 0.64 0.72  Sodium 135 - 145 mmol/L 143 139 138  Potassium 3.5 - 5.1 mmol/L 4.0 4.3 4.9  Chloride 101 - 111 mmol/L 101 102 103  CO2 22 - 32 mmol/L 29 28 26   Calcium 8.9 - 10.3 mg/dL 8.9 4.0(J8.7(L) 8.9   CBC Latest Ref Rng 12/12/2014 12/11/2014 12/10/2014  WBC 4.0 - 10.5 K/uL 6.3 7.4 4.9  Hemoglobin 13.0 - 17.0 g/dL 10.6(L) 11.3(L) 13.0  Hematocrit 39.0 - 52.0 % 32.0(L) 33.8(L) 38.6(L)  Platelets 150 - 400 K/uL 248 280 274      Radiology: Ct Maxillofacial W/cm  12/10/2014  CLINICAL DATA:  Patient with history of left-sided dental infection, abscess. Status post surgical debridement with drain placement today. Verify drain placement. EXAM: CT MAXILLOFACIAL WITH CONTRAST TECHNIQUE: Multidetector CT imaging of the maxillofacial structures was performed with intravenous contrast. Multiplanar CT image reconstructions were also generated. A  small metallic BB was placed on the right temple in order to reliably differentiate right from left. CONTRAST:  75 cc of Omni 300. COMPARISON:  Prior CT from 12/05/2014. FINDINGS: Visualized portions of the brain demonstrate no acute abnormality. Globes and orbits demonstrate no acute abnormality. Scattered mucosal thickening within the frontoethmoidal recesses and ethmoidal sinuses as well as the maxillary sinuses. No air-fluid levels to suggest active sinus infection. Mastoid air cells are clear. Middle ear cavities are clear. Teeth number 14, 15, and 16 have been extracted. There has been interval placement of an intraoral drain, proximal aspect which extends into the pocket of the removed right first mandibular molar. The drain extends superiorly and posteriorly adjacent to the core cord process of the mandibular ramus with tip coiled in the infratemporal fossa. A second drain extends through the skin of the left temporal region in is coiled in the left temporalis muscle. No residual rim enhancing fluid collection now identified. There is persistent soft tissue swelling with inflammatory fat stranding within the left face, consistent with cellulitis. Retro antral fat on the left remains obliterated. Vague hypodensity adjacent to the left mandibular ramus on coronal sequence (series 7, image 51) more consistent with phlegmon rather than frank abscess at this point. Scattered soft tissue emphysema likely postsurgical in nature. Shotty subcentimeter adenopathy present within the upper neck. Mildly prominent left level 1B node measuring 9 mm likely reactive in nature. No retropharyngeal fluid collection. Epiglottis normal. Vallecular cleared visualized topple pharynx and supraglottic larynx demonstrate no acute abnormality. IMPRESSION: Postoperative changes from interval tooth extraction with debridement of left-sided odontogenic and deep fascial abscesses. Surgical drains in place as above. Changes of persistent  cellulitis present within the left masticator space with no definable or rim enhancing abscess now identified. Electronically Signed   By: Rise MuBenjamin  McClintock M.D.   On: 12/10/2014 22:12   Exam: The patient's cranial nerves V, VII are intact, the two temporal space drains are in place extraorally; the dressing is in place with minimal output.  Intraorally there is minimal output from the drain into the bottom the temporal space.  The extraction sites are hemostatic and sutures are in place.  His opening is limited to about 15 mm.     A/P: Mr. Jesse Rodgers is doing well s/p above.  He has been afebrile and his WBC level is within normal limits. 1. Continue IV antibiotics  2. The patient can have a pureed diet 3. Monitor blood glucose and adjust with SSI  4. Will discharge to home.    Kemmerer,Makar Slatter L 12/12/2014, 7:34 AM

## 2014-12-14 LAB — TISSUE CULTURE
Culture: NO GROWTH
GRAM STAIN: NONE SEEN

## 2014-12-15 LAB — ANAEROBIC CULTURE: Gram Stain: NONE SEEN

## 2014-12-15 LAB — GLUCOSE, CAPILLARY
Glucose-Capillary: 118 mg/dL — ABNORMAL HIGH (ref 65–99)
Glucose-Capillary: 128 mg/dL — ABNORMAL HIGH (ref 65–99)

## 2015-01-03 NOTE — Discharge Summary (Signed)
Physician Discharge Summary  Patient ID: Jesse Rodgers MRN: [id number] DOB/AGE: [birth year] 39 y.o.  Admit date: 12/10/2014 Discharge date: 12/12/14  Admission Diagnoses: Dental Abscess  Discharge Diagnoses:  Dental Abscess, Deep temporal space infection, Carious Teeth #14 and 15, Impacted tooth #16   Active Problems:   * No active hospital problems. *   Discharged Condition: stable  Hospital Course: The patient was taken to the OR on 12/10/2014 for extraction of #14, 15, 16; I&D of deep temporal space infection; and bone biopsy of the left mandibular ramus. The patient did well s/p surgery; he was placed on a sliding scale for his insulin to control his blood glucose.  His pain was well controlled and his white blood cell count normalized.  The patient was discharged on 12/12/14 with drains in place with follow up in my office for penrose drain removal.    Consults: None  Significant Diagnostic Studies: labs: CBC  Treatments: surgery:  Extraction of #14, 15, 16; I&D of deep temporal space infection; and  Bone biopsy of the left mandibular ramus.    Discharge Exam: Blood pressure 119/72, pulse 64, temperature 98.4 F (36.9 C), temperature source Oral, resp. rate 17, height  (1.905 m), weight 83.915 kg (185 lb), SpO2 97 %. General appearance: alert and cooperative Head: Normocephalic, without obvious abnormality, atraumatic Eyes: conjunctivae/corneas clear. PERRL, EOM's intact. Fundi benign. Ears: normal TM's and external ear canals both ears Nose: Nares normal. Septum midline. Mucosa normal. No drainage or sinus tenderness. Throat: Extraction sites healing well intraoral left deep temporal drain in place.    Extraoral drain (2 x penrose) in place   Disposition: 01-Home or Self Care  Discharge Instructions    Diet - low sodium heart healthy    Complete by:  As directed      Increase activity slowly    Complete by:  As directed             Medication List     TAKE these medications        amoxicillin-clavulanate 875-125 MG tablet  Commonly known as:  AUGMENTIN  Take 1 tablet by mouth 2 (two) times daily.     Cinnamon 500 MG Tabs  Take 2,000 mg by mouth every morning.     Garlic 1000 MG Caps  Take 2,000 mg by mouth every morning.     ibuprofen 200 MG tablet  Commonly known as:  ADVIL,MOTRIN  Take 2 tablets (400 mg total) by mouth every 8 (eight) hours.     insulin aspart 100 UNIT/ML injection  Commonly known as:  novoLOG  Inject 1-15 Units into the skin 3 (three) times daily before meals. Sliding scale     losartan 25 MG tablet  Commonly known as:  COZAAR  Take 25 mg by mouth daily.     multivitamin with minerals Tabs tablet  Take 1 tablet by mouth daily.     oxyCODONE-acetaminophen 10-325 MG tablet  Commonly known as:  PERCOCET  Take 1 tablet by mouth every 6 (six) hours as needed for pain.     oxyCODONE-acetaminophen 5-325 MG tablet  Commonly known as:  PERCOCET/ROXICET  Take 1-2 tablets by mouth every 6 (six) hours as needed for moderate pain.     sertraline 100 MG tablet  Commonly known as:  ZOLOFT  Take 100 mg by mouth daily.     simvastatin 40 MG tablet  Commonly known as:  ZOCOR  Take 40 mg by mouth daily.  TOUJEO SOLOSTAR 300 UNIT/ML Sopn  Generic drug:  Insulin Glargine  Inject 45 Units into the skin daily before supper.           Follow-up Information    Follow up with [place],Kagan Mutchler L, DDS In 2 days.   Specialty:  Oral Surgery   Contact information:   [address], [address] [place] [address] [phone]       Signed: [place],Saraiyah Hemminger L 01/03/2015, 9:05 PM

## 2016-11-09 IMAGING — CT CT MAXILLOFACIAL W/ CM
3 series · 15 of 47 positions shown, 18 images · IV contrast (omnipaque)
Comparison: None.

ADDENDUM:
There is also thickening of the left masticator muscle worrisome for
myositis.
CLINICAL DATA: Left-sided facial swelling and pain for 6 weeks.

EXAM:
CT MAXILLOFACIAL WITH CONTRAST
TECHNIQUE: Multidetector CT imaging of the maxillofacial structures was
performed with intravenous contrast. Multiplanar CT image
reconstructions were also generated. A small metallic BB was placed
on the right temple in order to reliably differentiate right from
left.
CONTRAST:  75mL OMNIPAQUE IOHEXOL 300 MG/ML  SOLN

[Series 201: facial bones, idose (1) · axial · 0.37mm/px · z∈[+63,+207]mm · 9 of 84 slices shown, 12 images]
[im 6/84  brain]
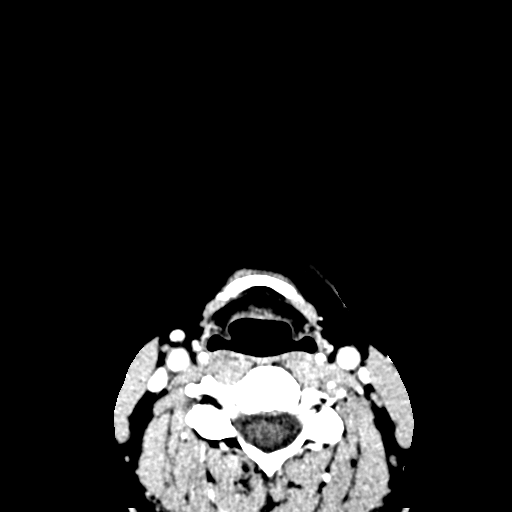
[im 6/84  bone]
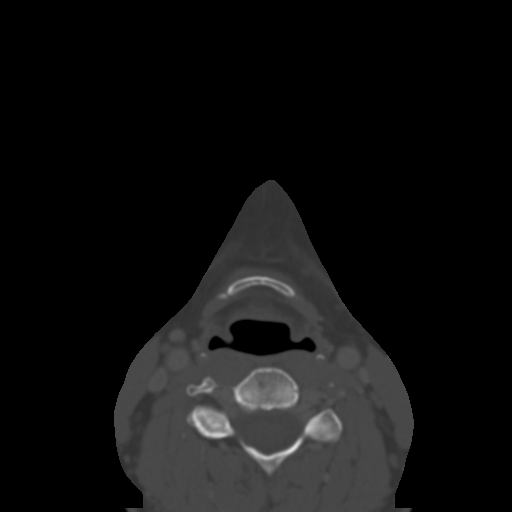
[im 15/84  bone]
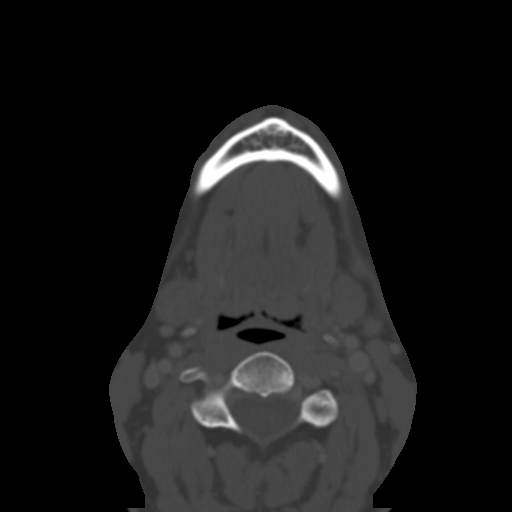
[im 23/84  bone]
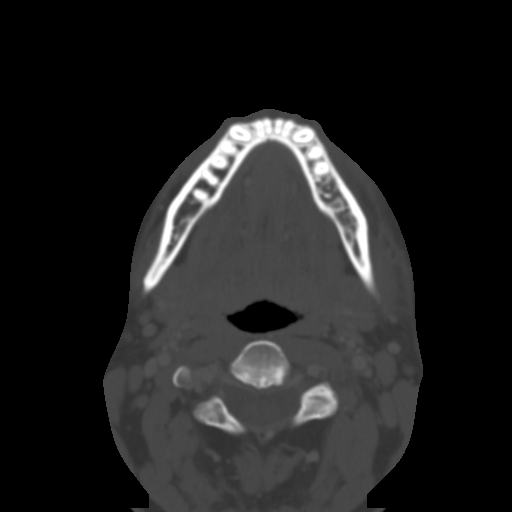
[im 32/84  bone]
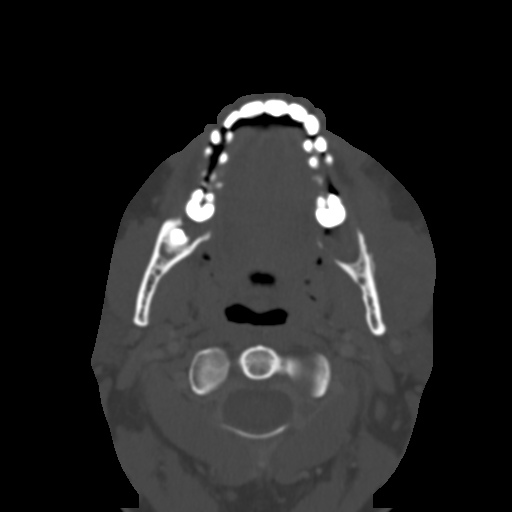
[im 43/84  brain]
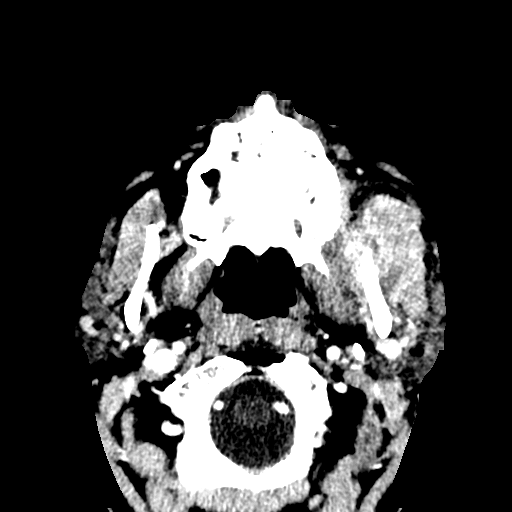
[im 43/84  bone]
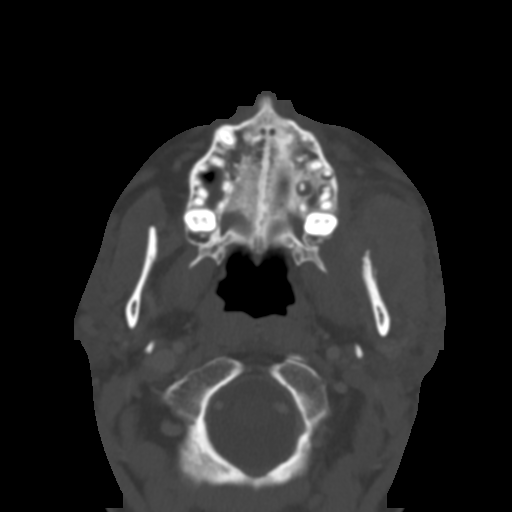
[im 52/84  bone]
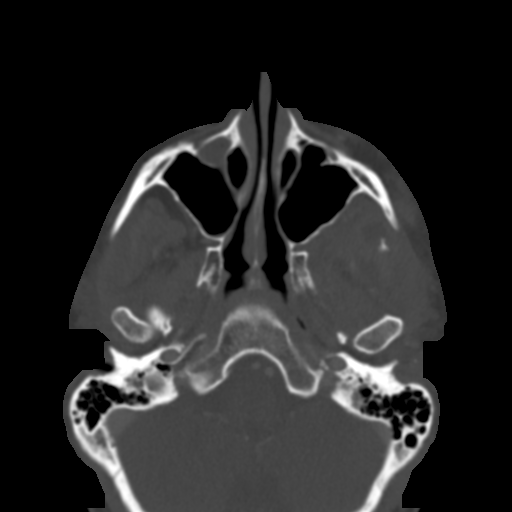
[im 61/84  bone]
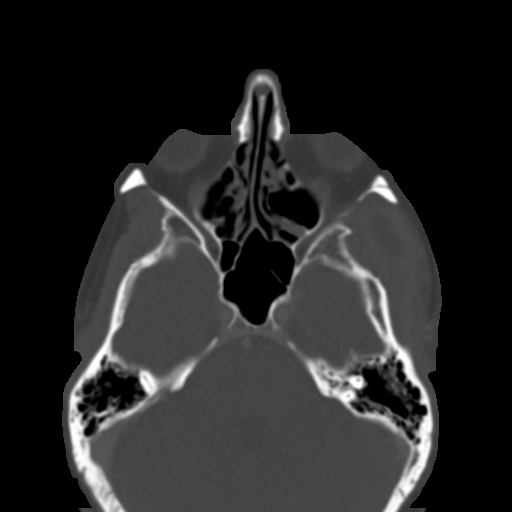
[im 69/84  bone]
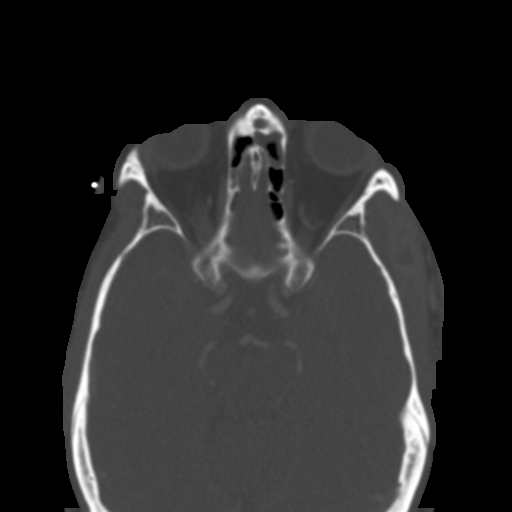
[im 78/84  brain]
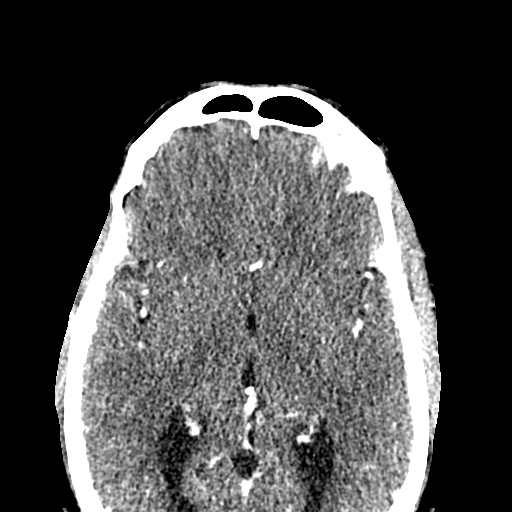
[im 78/84  bone]
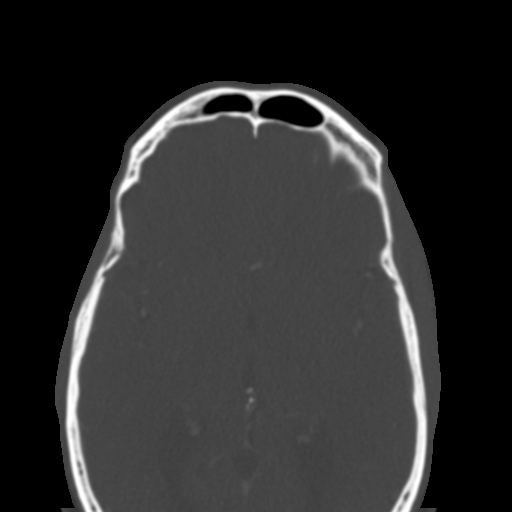

[Series 203: coronal std, idose (1) · coronal · 0.34mm/px · 3 of 90 slices shown]
[im 30/90  bone]
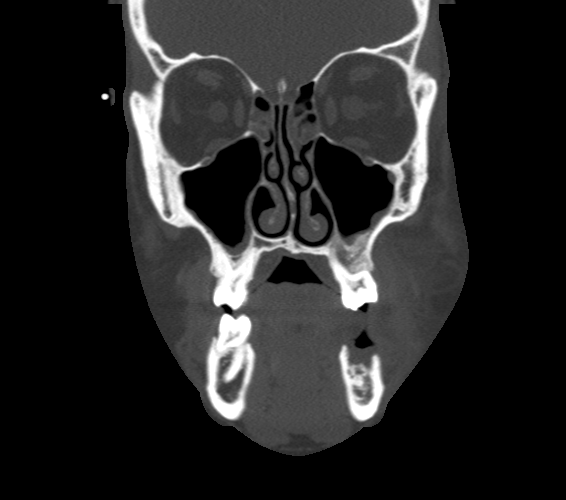
[im 40/90  bone]
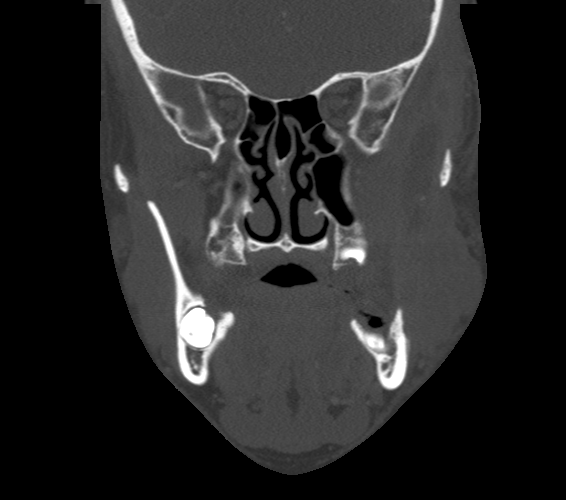
[im 50/90  bone]
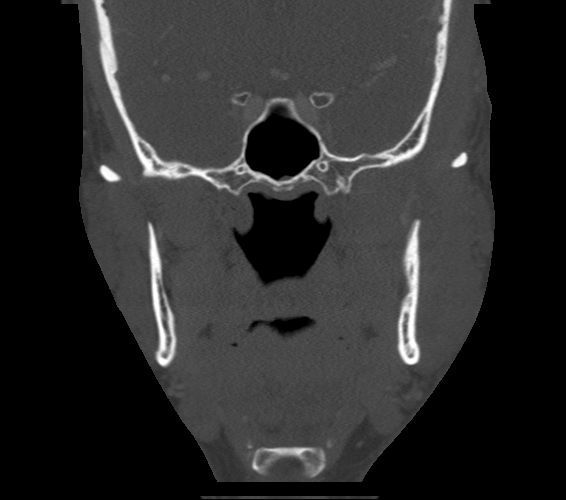

[Series 204: sagittal std, idose (1) · sagittal · 0.34mm/px · 3 of 94 slices shown]
[im 32/94  bone]
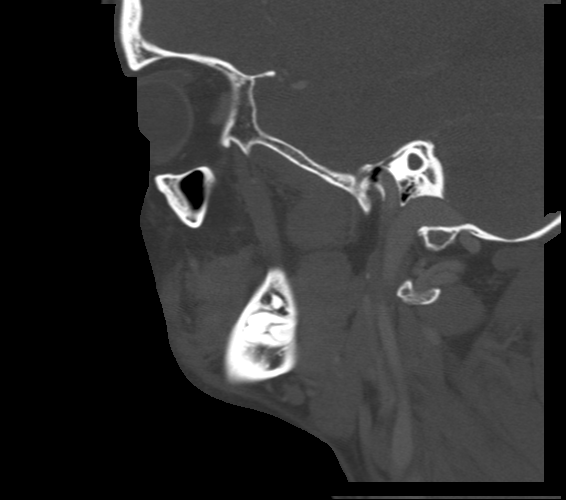
[im 47/94  bone]
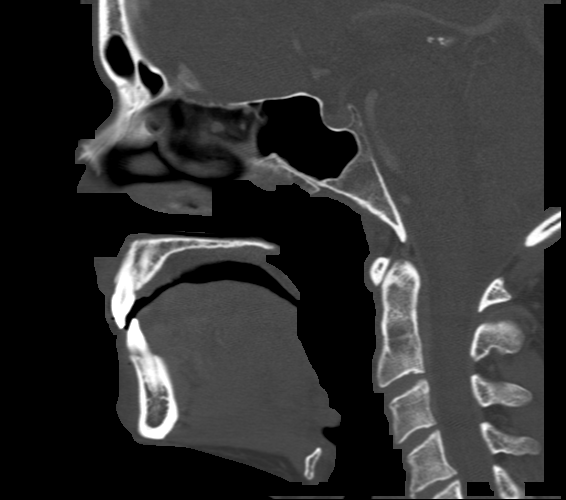
[im 63/94  bone]
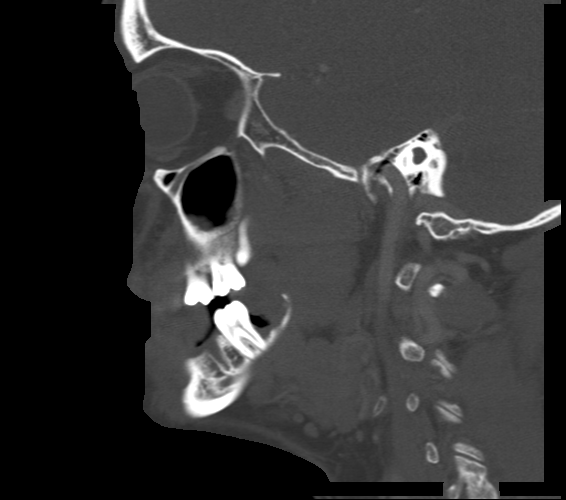

[15 of 47 positions shown; findings below may reference images not displayed]

FINDINGS: There is a 1.6 x 1.0 x 4.9 cm low-density area with peripheral rim
enhancement, within the left temporalis muscle. This is worrisome
for an abscess. It extends superiorly along the outside of the
sphenoid bone, and extends into the masticator space adjacent to the
coronoid process. There is an empty socket along the left side of
the mandible as molars have been removed. There is periapical
lucency surrounding the roots of upper left-sided bimolars.

There is aggressive periosteal reaction along the outer aspect of
the coronoid process of the mandible. There is also a moth-eaten
appearance of this portion of the mandibular ramus. These findings
are worrisome for osteomyelitis.

Nasopharynx and oropharynx are within normal limits.

Larynx is unremarkable

Salivary glands are within normal limits

Small left-sided neck lymph nodes are present supporting
inflammatory etiology.

Mastoid air cells are clear. There is mucosal thickening throughout
the ethmoid air cells, maxillary sinus, and frontal sinuses.
Sphenoid sinuses clear.

Jugular veins are grossly patent. Carotid arteries are grossly
patent. Vertebral arteries are grossly patent.
IMPRESSION: There is a 1.6 x 1.0 x 4.9 cm abscess within the left temporalis
muscle as described above. Findings are also worrisome for
osteomyelitis of the left mandibular ramus.

## 2017-01-14 DIAGNOSIS — J329 Chronic sinusitis, unspecified: Secondary | ICD-10-CM | POA: Diagnosis not present

## 2017-01-14 DIAGNOSIS — J209 Acute bronchitis, unspecified: Secondary | ICD-10-CM | POA: Diagnosis not present

## 2017-01-14 DIAGNOSIS — J069 Acute upper respiratory infection, unspecified: Secondary | ICD-10-CM | POA: Diagnosis not present

## 2017-01-26 DIAGNOSIS — M545 Low back pain: Secondary | ICD-10-CM | POA: Diagnosis not present

## 2017-01-26 DIAGNOSIS — E663 Overweight: Secondary | ICD-10-CM | POA: Diagnosis not present

## 2017-01-26 DIAGNOSIS — Z1389 Encounter for screening for other disorder: Secondary | ICD-10-CM | POA: Diagnosis not present

## 2017-01-26 DIAGNOSIS — Z6826 Body mass index (BMI) 26.0-26.9, adult: Secondary | ICD-10-CM | POA: Diagnosis not present

## 2017-02-11 DIAGNOSIS — E1165 Type 2 diabetes mellitus with hyperglycemia: Secondary | ICD-10-CM | POA: Diagnosis not present

## 2017-02-11 DIAGNOSIS — Z1389 Encounter for screening for other disorder: Secondary | ICD-10-CM | POA: Diagnosis not present

## 2017-02-11 DIAGNOSIS — M5136 Other intervertebral disc degeneration, lumbar region: Secondary | ICD-10-CM | POA: Diagnosis not present

## 2017-02-11 DIAGNOSIS — E782 Mixed hyperlipidemia: Secondary | ICD-10-CM | POA: Diagnosis not present

## 2017-02-11 DIAGNOSIS — M5410 Radiculopathy, site unspecified: Secondary | ICD-10-CM | POA: Diagnosis not present

## 2017-02-11 DIAGNOSIS — E663 Overweight: Secondary | ICD-10-CM | POA: Diagnosis not present

## 2017-02-11 DIAGNOSIS — Z23 Encounter for immunization: Secondary | ICD-10-CM | POA: Diagnosis not present

## 2017-02-11 DIAGNOSIS — I10 Essential (primary) hypertension: Secondary | ICD-10-CM | POA: Diagnosis not present

## 2017-02-11 DIAGNOSIS — Z6826 Body mass index (BMI) 26.0-26.9, adult: Secondary | ICD-10-CM | POA: Diagnosis not present

## 2017-02-12 DIAGNOSIS — J069 Acute upper respiratory infection, unspecified: Secondary | ICD-10-CM | POA: Diagnosis not present

## 2017-07-21 DIAGNOSIS — M25512 Pain in left shoulder: Secondary | ICD-10-CM | POA: Diagnosis not present

## 2017-07-21 DIAGNOSIS — E663 Overweight: Secondary | ICD-10-CM | POA: Diagnosis not present

## 2017-07-21 DIAGNOSIS — Z6826 Body mass index (BMI) 26.0-26.9, adult: Secondary | ICD-10-CM | POA: Diagnosis not present

## 2017-07-21 DIAGNOSIS — E782 Mixed hyperlipidemia: Secondary | ICD-10-CM | POA: Diagnosis not present

## 2017-07-21 DIAGNOSIS — Z1389 Encounter for screening for other disorder: Secondary | ICD-10-CM | POA: Diagnosis not present

## 2017-07-21 DIAGNOSIS — I10 Essential (primary) hypertension: Secondary | ICD-10-CM | POA: Diagnosis not present

## 2017-07-21 DIAGNOSIS — E109 Type 1 diabetes mellitus without complications: Secondary | ICD-10-CM | POA: Diagnosis not present

## 2017-08-17 ENCOUNTER — Encounter: Payer: Self-pay | Admitting: Cardiovascular Disease

## 2017-08-31 ENCOUNTER — Ambulatory Visit: Payer: Self-pay | Admitting: "Endocrinology

## 2017-09-06 ENCOUNTER — Ambulatory Visit: Payer: Self-pay | Admitting: "Endocrinology

## 2017-09-16 DIAGNOSIS — M7502 Adhesive capsulitis of left shoulder: Secondary | ICD-10-CM | POA: Diagnosis not present

## 2017-09-28 ENCOUNTER — Ambulatory Visit (INDEPENDENT_AMBULATORY_CARE_PROVIDER_SITE_OTHER): Payer: BLUE CROSS/BLUE SHIELD | Admitting: "Endocrinology

## 2017-09-28 ENCOUNTER — Encounter: Payer: Self-pay | Admitting: "Endocrinology

## 2017-09-28 ENCOUNTER — Other Ambulatory Visit: Payer: Self-pay | Admitting: "Endocrinology

## 2017-09-28 VITALS — BP 117/72 | HR 99 | Ht 73.0 in | Wt 205.4 lb

## 2017-09-28 DIAGNOSIS — F17200 Nicotine dependence, unspecified, uncomplicated: Secondary | ICD-10-CM

## 2017-09-28 DIAGNOSIS — Z720 Tobacco use: Secondary | ICD-10-CM | POA: Insufficient documentation

## 2017-09-28 DIAGNOSIS — E1065 Type 1 diabetes mellitus with hyperglycemia: Secondary | ICD-10-CM

## 2017-09-28 DIAGNOSIS — E782 Mixed hyperlipidemia: Secondary | ICD-10-CM | POA: Diagnosis not present

## 2017-09-28 DIAGNOSIS — I10 Essential (primary) hypertension: Secondary | ICD-10-CM | POA: Diagnosis not present

## 2017-09-28 MED ORDER — INSULIN DEGLUDEC 100 UNIT/ML ~~LOC~~ SOPN: 30.0000 [IU] | PEN_INJECTOR | Freq: Every day | SUBCUTANEOUS | 2 refills | Status: DC

## 2017-09-28 MED ORDER — INSULIN LISPRO 100 UNIT/ML (KWIKPEN): 5.0000 [IU] | PEN_INJECTOR | Freq: Three times a day (TID) | SUBCUTANEOUS | 2 refills | Status: DC

## 2017-09-28 NOTE — Patient Instructions (Signed)

## 2017-09-28 NOTE — Progress Notes (Signed)
Endocrinology Consult Note       09/28/2017, 5:04 PM   Subjective:    Patient ID: Jesse Rodgers, male    DOB: [birth year].  Jesse Rodgers is being seen in consultation for management of currently uncontrolled symptomatic diabetes requested by  Avis EpleyJackson, Samantha J, PA-C.   Past Medical History:  Diagnosis Date  . Arthritis    back   . Depression    takes Zoloft daily  . Diabetes mellitus without complication (HCC)    takes Toujeo and Novolog nightly;average fasting blood sugar runs 150  . History of kidney stones   . Hyperlipidemia    takes Simvastatin daily  . Hypertension    takes Losartan daily   Past Surgical History:  Procedure Laterality Date  . ABDOMINAL SURGERY    . APPENDECTOMY    . BACK SURGERY    . DENTAL SURGERY  12/10/2014   molar tooth #  14,15,16  bone biopsy  of ramus  . PARTIAL COLECTOMY    . TOOTH EXTRACTION N/A 12/10/2014   Procedure: EXTRACTION MOLARS #14,15,16  INTRAORAL AND EXTRAORAL INCISION AND DRAINAGE AND BONE BIOPSY OF LEFT RAMUS;  Surgeon: Lincoln Brighamhristopher Roscoe, DDS;  Location: [facility] OR;  Service: Oral Surgery;  Laterality: N/A;   Social History   Socioeconomic History  . Marital status: Married    Spouse name: Not on file  . Number of children: Not on file  . Years of education: Not on file  . Highest education level: Not on file  Occupational History  . Not on file  Social Needs  . Financial resource strain: Not on file  . Food insecurity:    Worry: Not on file    Inability: Not on file  . Transportation needs:    Medical: Not on file    Non-medical: Not on file  Tobacco Use  . Smoking status: Current Every Day Smoker    Packs/day: 1.00    Years: 15.00    Pack years: 15.00  . Smokeless tobacco: Never Used  Substance and Sexual Activity  . Alcohol use: Yes    Comment: occasional  . Drug use: No  . Sexual activity: Yes  Lifestyle  . Physical  activity:    Days per week: Not on file    Minutes per session: Not on file  . Stress: Not on file  Relationships  . Social connections:    Talks on phone: Not on file    Gets together: Not on file    Attends religious service: Not on file    Active member of club or organization: Not on file    Attends meetings of clubs or organizations: Not on file    Relationship status: Not on file  Other Topics Concern  . Not on file  Social History Narrative  . Not on file   Outpatient Encounter Medications as of 09/28/2017  Medication Sig  . celecoxib (CELEBREX) 200 MG capsule TK 1 C PO QD WF  . Cinnamon 500 MG TABS Take 2,000 mg by mouth every morning.  . Garlic 1000 MG CAPS Take 2,000 mg by mouth every morning.  Marland Kitchen. ibuprofen (  ADVIL,MOTRIN) 200 MG tablet Take 2 tablets (400 mg total) by mouth every 8 (eight) hours.  Marland Kitchen losartan (COZAAR) 25 MG tablet Take 25 mg by mouth daily.  . Multiple Vitamin (MULTIVITAMIN WITH MINERALS) TABS tablet Take 1 tablet by mouth daily.  . simvastatin (ZOCOR) 40 MG tablet Take 40 mg by mouth daily.   . [DISCONTINUED] Insulin Glargine (TOUJEO SOLOSTAR) 300 UNIT/ML SOPN Inject 30 Units into the skin at bedtime.  . insulin degludec (TRESIBA FLEXTOUCH) 100 UNIT/ML SOPN FlexTouch Pen Inject 0.3 mLs (30 Units total) into the skin at bedtime.  . insulin lispro (HUMALOG KWIKPEN) 100 UNIT/ML KiwkPen Inject 0.05-0.11 mLs (5-11 Units total) into the skin 3 (three) times daily before meals.  Marland Kitchen oxyCODONE-acetaminophen (PERCOCET) 10-325 MG per tablet Take 1 tablet by mouth every 6 (six) hours as needed for pain.  Marland Kitchen oxyCODONE-acetaminophen (PERCOCET/ROXICET) 5-325 MG per tablet Take 1-2 tablets by mouth every 6 (six) hours as needed for moderate pain. (Patient not taking: Reported on 09/28/2017)  . sertraline (ZOLOFT) 100 MG tablet Take 100 mg by mouth daily.  . [DISCONTINUED] amoxicillin-clavulanate (AUGMENTIN) 875-125 MG per tablet Take 1 tablet by mouth 2 (two) times daily.  (Patient not taking: Reported on 09/28/2017)  . [DISCONTINUED] insulin aspart (NOVOLOG) 100 UNIT/ML injection Inject 5-11 Units into the skin 3 (three) times daily before meals. Sliding scale   No facility-administered encounter medications on file as of 09/28/2017.     ALLERGIES: No Known Allergies  VACCINATION STATUS: Immunization History  Administered Date(s) Administered  . Influenza-Unspecified 11/23/2014    Diabetes  He presents for his initial diabetic visit. He has type 1 diabetes mellitus. Onset time: He was diagnosed at approximate age of 8 years. His disease course has been worsening. There are no hypoglycemic associated symptoms. Pertinent negatives for hypoglycemia include no confusion, headaches, pallor or seizures. Associated symptoms include polydipsia and polyuria. Pertinent negatives for diabetes include no chest pain, no fatigue, no polyphagia and no weakness. There are no hypoglycemic complications. Symptoms are worsening. There are no diabetic complications. Risk factors for coronary artery disease include dyslipidemia, diabetes mellitus, hypertension, family history, male sex, tobacco exposure and sedentary lifestyle. Current diabetic treatment includes insulin injections (He is currently on Lantus 50 units nightly, Humalog sliding scale 3 times a day.). His weight is increasing steadily. He is following a generally unhealthy diet. When asked about meal planning, he reported none. He has not had a previous visit with a dietitian. He never participates in exercise. (He did not bring any meter nor logs to review.  He recalls his last A1c of 15-month ago, does not know his numbers.) An ACE inhibitor/angiotensin II receptor blocker is being taken. Eye exam is current.  Hyperlipidemia  This is a chronic problem. The current episode started more than 1 year ago. Exacerbating diseases include diabetes. Pertinent negatives include no chest pain, myalgias or shortness of breath. Risk  factors for coronary artery disease include diabetes mellitus, dyslipidemia, hypertension, male sex and a sedentary lifestyle.  Hypertension  This is a chronic problem. The problem is controlled. Pertinent negatives include no chest pain, headaches, neck pain, palpitations or shortness of breath. Risk factors for coronary artery disease include diabetes mellitus, dyslipidemia, male gender, smoking/tobacco exposure and sedentary lifestyle. Past treatments include ACE inhibitors.      Review of Systems  Constitutional: Negative for chills, fatigue, fever and unexpected weight change.  HENT: Negative for dental problem, mouth sores and trouble swallowing.   Eyes: Negative for visual disturbance.  Respiratory:  Negative for cough, choking, chest tightness, shortness of breath and wheezing.   Cardiovascular: Negative for chest pain, palpitations and leg swelling.  Gastrointestinal: Negative for abdominal distention, abdominal pain, constipation, diarrhea, nausea and vomiting.  Endocrine: Positive for polydipsia and polyuria. Negative for polyphagia.  Genitourinary: Negative for dysuria, flank pain, hematuria and urgency.  Musculoskeletal: Negative for back pain, gait problem, myalgias and neck pain.  Skin: Negative for pallor, rash and wound.  Neurological: Negative for seizures, syncope, weakness, numbness and headaches.  Psychiatric/Behavioral: Negative.  Negative for confusion and dysphoric mood.    Objective:    BP 117/72 (BP Location: Left Arm, Patient Position: Sitting)   Pulse 99   Ht 6\' 1"  (1.854 m)   Wt 205 lb 6 oz (93.2 kg)   SpO2 98%   BMI 27.10 kg/m   Wt Readings from Last 3 Encounters:  09/28/17 205 lb 6 oz (93.2 kg)  12/10/14 185 lb (83.9 kg)  10/23/14 191 lb 4.8 oz (86.8 kg)     Physical Exam  Constitutional: He is oriented to person, place, and time. He appears well-developed and well-nourished. He is cooperative. No distress.  HENT:  Head: Normocephalic and  atraumatic.  Eyes: EOM are normal.  Neck: Normal range of motion. Neck supple. No tracheal deviation present. No thyromegaly present.  Cardiovascular: Normal rate, S1 normal, S2 normal and normal heart sounds. Exam reveals no gallop.  No murmur heard. Pulses:      Dorsalis pedis pulses are 1+ on the right side, and 1+ on the left side.       Posterior tibial pulses are 1+ on the right side, and 1+ on the left side.  Pulmonary/Chest: Breath sounds normal. No respiratory distress. He has no wheezes.  Abdominal: Soft. Bowel sounds are normal. He exhibits no distension. There is no tenderness. There is no guarding and no CVA tenderness.  Musculoskeletal: He exhibits no edema.       Right shoulder: He exhibits no swelling and no deformity.  Neurological: He is alert and oriented to person, place, and time. He has normal strength and normal reflexes. No cranial nerve deficit or sensory deficit. Gait normal.  Skin: Skin is warm and dry. No rash noted. No cyanosis. Nails show no clubbing.  Psychiatric: He has a normal mood and affect. His speech is normal. Judgment normal. Cognition and memory are normal.      CMP ( most recent) CMP     Component Value Date/Time   NA 143 12/12/2014 0530   K 4.0 12/12/2014 0530   CL 101 12/12/2014 0530   CO2 29 12/12/2014 0530   GLUCOSE 74 12/12/2014 0530   BUN 12 12/12/2014 0530   CREATININE 0.66 12/12/2014 0530   CALCIUM 8.9 12/12/2014 0530   GFRNONAA >60 12/12/2014 0530   GFRAA >60 12/12/2014 0530      Assessment & Plan:   1. Uncontrolled type 1 diabetes mellitus with hyperglycemia (HCC)  - Jesse Rodgers has currently uncontrolled symptomatic type 2 DM since 42 years of age.  He denies any history of diabetes ketoacidosis. -He does not have recent A1c.  Will be sent for new set of labs including A1c, renal function, thyroid function test.    Recent labs reviewed.  -his diabetes is complicated by chronic heavy smoking and Jesse Rodgers  remains at a high risk for more acute and chronic complications which include CAD, CVA, CKD, retinopathy, and neuropathy. These are all discussed in detail with the patient.  - I  have counseled him on diet management by adopting a carbohydrate restricted/protein rich diet.  - Suggestion is made for him to avoid simple carbohydrates  from his diet including Cakes, Sweet Desserts, Ice Cream, Soda (diet and regular), Sweet Tea, Candies, Chips, Cookies, Store Bought Juices, Alcohol in Excess of  1-2 drinks a day, Artificial Sweeteners, and "Sugar-free" Products. This will help patient to have stable blood glucose profile and potentially avoid unintended weight gain.  - I encouraged him to switch to  unprocessed or minimally processed complex starch and increased protein intake (animal or plant source), fruits, and vegetables.  - he is advised to stick to a routine mealtimes to eat 3 meals  a day and avoid unnecessary snacks ( to snack only to correct hypoglycemia).   - he will be scheduled with Norm Salt, RDN, CDE for individualized diabetes education.  - I have approached him with the following individualized plan to manage diabetes and patient agrees:   -He will continue to require intensive treatment with basal/bolus insulin in order for her to achieve control of diabetes and maintain control to target.   -He has been getting his insulin through online markets including [employer] with significant variation in efficacy and he had trouble regulating his blood glucose profile.   -I advised him to stop all of his other basal insulin products he may have at home, and start with Tresiba 30 units nightly, continue Humalog at 5 units  3 times a day with meals  for pre-meal BG readings of 90-150mg /dl, plus patient specific correction dose for unexpected hyperglycemia above 150mg /dl, associated with strict monitoring of glucose 4 times a day-before meals and at bedtime. -He is asked to return in 1 week  with his meter and logs for reevaluation. - Patient is warned not to take insulin without proper monitoring per orders. -Adjustment parameters are given for hypo and hyperglycemia in writing. -Patient is encouraged to call clinic for blood glucose levels less than 70 or above 300 mg /dl. - Patient specific target  A1c;  LDL, HDL, Triglycerides, and  Waist Circumference were discussed in detail.  2) BP/HTN: His blood pressure is controlled to target.  He is advised to continue his current medications including losartan 25 mg p.o. daily.    3) Lipids/HPL: No recent lipid panel to review.  He is advised to continue his simvastatin 40 mg p.o. Nightly.  4)  Weight/Diet: CDE Consult will be initiated , exercise, and detailed carbohydrates information provided.  5) Chronic Care/Health Maintenance:  -he  is on ACEI/ARB and Statin medications and  is encouraged to initiate and continue to follow up with Ophthalmology, Dentist,  Podiatrist at least yearly or according to recommendations, and he is extensively counseled against smoking.   have recommended yearly flu vaccine and pneumonia vaccine at least every 5 years; moderate intensity exercise for up to 150 minutes weekly; and  sleep for at least 7 hours a day.  - I advised patient to maintain close follow up with Avis Epley, PA-C for primary care needs.  - Time spent with the patient: 45 minutes, of which >50% was spent in obtaining information about his symptoms, reviewing his previous labs, evaluations, and treatments, counseling him about his currently uncontrolled type 1 diabetes, chronic heavy smoking, hypertension, hyperlipidemia, and developing developing  plans for long term treatment based on the latest recommendations.  Jesse Pai participated in the discussions, expressed understanding, and voiced agreement with the above plans.  All questions were answered  to his satisfaction. he is encouraged to contact clinic should he have  any questions or concerns prior to his return visit.  Follow up plan: - Return in about 1 week (around 10/05/2017) for meter, and logs, labs today.  Marquis Lunch, MD [facility] [address] [place], [place] [address] Phone: [phone]  Fax: [phone]    09/28/2017, 5:04 PM  This note was partially dictated with voice recognition software. Similar sounding words can be transcribed inadequately or may not  be corrected upon review.

## 2017-09-29 LAB — MICROALBUMIN / CREATININE URINE RATIO
CREATININE, URINE: 166 mg/dL (ref 20–320)
Microalb Creat Ratio: 10 mcg/mg creat (ref <30)
Microalb, Ur: 1.7 mg/dL

## 2017-09-29 LAB — T4, FREE: FREE T4: 1 ng/dL (ref 0.8–1.8)

## 2017-09-29 LAB — COMPLETE METABOLIC PANEL WITH GFR
AG Ratio: 1.8 (calc) (ref 1.0–2.5)
ALBUMIN MSPROF: 4.4 g/dL (ref 3.6–5.1)
ALT: 18 U/L (ref 9–46)
AST: 15 U/L (ref 10–40)
Alkaline phosphatase (APISO): 71 U/L (ref 40–115)
BUN/Creatinine Ratio: 27 (calc) — ABNORMAL HIGH (ref 6–22)
BUN: 26 mg/dL — AB (ref 7–25)
CO2: 25 mmol/L (ref 20–32)
CREATININE: 0.95 mg/dL (ref 0.60–1.35)
Calcium: 9.2 mg/dL (ref 8.6–10.3)
Chloride: 103 mmol/L (ref 98–110)
GFR, EST AFRICAN AMERICAN: 112 mL/min/{1.73_m2} (ref >=60)
GFR, EST NON AFRICAN AMERICAN: 97 mL/min/{1.73_m2} (ref >=60)
GLOBULIN: 2.4 g/dL (ref 1.9–3.7)
GLUCOSE: 170 mg/dL — AB (ref 65–139)
Potassium: 4.2 mmol/L (ref 3.5–5.3)
Sodium: 137 mmol/L (ref 135–146)
Total Bilirubin: 0.3 mg/dL (ref 0.2–1.2)
Total Protein: 6.8 g/dL (ref 6.1–8.1)

## 2017-09-29 LAB — TSH: TSH: 1.47 mIU/L (ref 0.40–4.50)

## 2017-09-29 LAB — HEMOGLOBIN A1C
Hgb A1c MFr Bld: 7.1 % of total Hgb — ABNORMAL HIGH (ref <5.7)
Mean Plasma Glucose: 157 (calc)
eAG (mmol/L): 8.7 (calc)

## 2017-09-29 LAB — VITAMIN B12: Vitamin B-12: 424 pg/mL (ref 200–1100)

## 2017-09-29 LAB — VITAMIN D 25 HYDROXY (VIT D DEFICIENCY, FRACTURES): Vit D, 25-Hydroxy: 24 ng/mL — ABNORMAL LOW (ref 30–100)

## 2017-10-06 ENCOUNTER — Ambulatory Visit: Payer: BLUE CROSS/BLUE SHIELD | Admitting: "Endocrinology

## 2017-10-06 ENCOUNTER — Encounter: Payer: Self-pay | Admitting: "Endocrinology

## 2017-10-06 VITALS — BP 135/80 | HR 87 | Ht 73.0 in | Wt 205.0 lb

## 2017-10-06 DIAGNOSIS — E1065 Type 1 diabetes mellitus with hyperglycemia: Secondary | ICD-10-CM | POA: Diagnosis not present

## 2017-10-06 DIAGNOSIS — I10 Essential (primary) hypertension: Secondary | ICD-10-CM

## 2017-10-06 DIAGNOSIS — E119 Type 2 diabetes mellitus without complications: Secondary | ICD-10-CM | POA: Diagnosis not present

## 2017-10-06 DIAGNOSIS — E782 Mixed hyperlipidemia: Secondary | ICD-10-CM

## 2017-10-06 DIAGNOSIS — F17200 Nicotine dependence, unspecified, uncomplicated: Secondary | ICD-10-CM | POA: Diagnosis not present

## 2017-10-06 NOTE — Progress Notes (Signed)
Endocrinology follow-up note       10/06/2017, 6:32 PM   Subjective:    Patient ID: Jesse Rodgers, male    DOB: 12-Mar-1975.  Jesse Rodgers is being seen in follow-up for management of currently uncontrolled symptomatic diabetes requested by  Avis Epley, PA-C.   Past Medical History:  Diagnosis Date  . Arthritis    back   . Depression    takes Zoloft daily  . Diabetes mellitus without complication (HCC)    takes Toujeo and Novolog nightly;average fasting blood sugar runs 150  . History of kidney stones   . Hyperlipidemia    takes Simvastatin daily  . Hypertension    takes Losartan daily   Past Surgical History:  Procedure Laterality Date  . ABDOMINAL SURGERY    . APPENDECTOMY    . BACK SURGERY    . DENTAL SURGERY  12/10/2014   molar tooth #  14,15,16  bone biopsy  of ramus  . PARTIAL COLECTOMY    . TOOTH EXTRACTION N/A 12/10/2014   Procedure: EXTRACTION MOLARS #14,15,16  INTRAORAL AND EXTRAORAL INCISION AND DRAINAGE AND BONE BIOPSY OF LEFT RAMUS;  Surgeon: [name], DDS;  Location: [facility] OR;  Service: Oral Surgery;  Laterality: N/A;   Social History   Socioeconomic History  . Marital status: Married    Spouse name: Not on file  . Number of children: Not on file  . Years of education: Not on file  . Highest education level: Not on file  Occupational History  . Not on file  Social Needs  . Financial resource strain: Not on file  . Food insecurity:    Worry: Not on file    Inability: Not on file  . Transportation needs:    Medical: Not on file    Non-medical: Not on file  Tobacco Use  . Smoking status: Current Every Day Smoker    Packs/day: 1.00    Years: 15.00    Pack years: 15.00  . Smokeless tobacco: Never Used  Substance and Sexual Activity  . Alcohol use: Yes    Comment: occasional  . Drug use: No  . Sexual activity: Yes  Lifestyle  . Physical  activity:    Days per week: Not on file    Minutes per session: Not on file  . Stress: Not on file  Relationships  . Social connections:    Talks on phone: Not on file    Gets together: Not on file    Attends religious service: Not on file    Active member of club or organization: Not on file    Attends meetings of clubs or organizations: Not on file    Relationship status: Not on file  Other Topics Concern  . Not on file  Social History Narrative  . Not on file   Outpatient Encounter Medications as of 10/06/2017  Medication Sig  . insulin lispro (HUMALOG KWIKPEN) 100 UNIT/ML KiwkPen Inject 5-11 Units into the skin 3 (three) times daily.  . celecoxib (CELEBREX) 200 MG capsule TK 1 C PO QD WF  . Cinnamon 500 MG TABS Take 2,000 mg by  mouth every morning.  . Garlic 1000 MG CAPS Take 2,000 mg by mouth every morning.  Marland Kitchen. ibuprofen (ADVIL,MOTRIN) 200 MG tablet Take 2 tablets (400 mg total) by mouth every 8 (eight) hours.  . insulin degludec (TRESIBA FLEXTOUCH) 100 UNIT/ML SOPN FlexTouch Pen Inject 0.3 mLs (30 Units total) into the skin at bedtime.  Marland Kitchen. losartan (COZAAR) 25 MG tablet Take 25 mg by mouth daily.  . Multiple Vitamin (MULTIVITAMIN WITH MINERALS) TABS tablet Take 1 tablet by mouth daily.  Marland Kitchen. oxyCODONE-acetaminophen (PERCOCET) 10-325 MG per tablet Take 1 tablet by mouth every 6 (six) hours as needed for pain.  Marland Kitchen. oxyCODONE-acetaminophen (PERCOCET/ROXICET) 5-325 MG per tablet Take 1-2 tablets by mouth every 6 (six) hours as needed for moderate pain. (Patient not taking: Reported on 09/28/2017)  . sertraline (ZOLOFT) 100 MG tablet Take 100 mg by mouth daily.  . simvastatin (ZOCOR) 40 MG tablet Take 40 mg by mouth daily.   . [DISCONTINUED] Insulin Aspart, w/Niacinamide, (FIASP) 100 UNIT/ML SOLN Inject 5-11 Units into the skin 3 (three) times daily.   No facility-administered encounter medications on file as of 10/06/2017.     ALLERGIES: No Known Allergies  VACCINATION  STATUS: Immunization History  Administered Date(s) Administered  . Influenza-Unspecified 11/23/2014    Diabetes  He presents for his follow-up diabetic visit. He has type 1 diabetes mellitus. Onset time: He was diagnosed at approximate age of 8 years. His disease course has been improving. There are no hypoglycemic associated symptoms. Pertinent negatives for hypoglycemia include no confusion, headaches, pallor or seizures. Associated symptoms include polydipsia and polyuria. Pertinent negatives for diabetes include no chest pain, no fatigue, no polyphagia and no weakness. There are no hypoglycemic complications. Symptoms are improving. There are no diabetic complications. Risk factors for coronary artery disease include dyslipidemia, diabetes mellitus, hypertension, family history, male sex, tobacco exposure and sedentary lifestyle. Current diabetic treatment includes insulin injections (He is currently on Lantus 50 units nightly, Humalog sliding scale 3 times a day.). He is compliant with treatment most of the time. His weight is increasing steadily. He is following a generally unhealthy diet. When asked about meal planning, he reported none. He has not had a previous visit with a dietitian. He never participates in exercise. His home blood glucose trend is fluctuating dramatically. His breakfast blood glucose range is generally 180-200 mg/dl. His lunch blood glucose range is generally 180-200 mg/dl. His dinner blood glucose range is generally 180-200 mg/dl. His bedtime blood glucose range is generally 180-200 mg/dl. His overall blood glucose range is 180-200 mg/dl. An ACE inhibitor/angiotensin II receptor blocker is being taken. Eye exam is current.  Hyperlipidemia  This is a chronic problem. The current episode started more than 1 year ago. Exacerbating diseases include diabetes. Pertinent negatives include no chest pain, myalgias or shortness of breath. Risk factors for coronary artery disease include  diabetes mellitus, dyslipidemia, hypertension, male sex and a sedentary lifestyle.  Hypertension  This is a chronic problem. The problem is controlled. Pertinent negatives include no chest pain, headaches, neck pain, palpitations or shortness of breath. Risk factors for coronary artery disease include diabetes mellitus, dyslipidemia, male gender, smoking/tobacco exposure and sedentary lifestyle. Past treatments include ACE inhibitors.      Review of Systems  Constitutional: Negative for chills, fatigue, fever and unexpected weight change.  HENT: Negative for dental problem, mouth sores and trouble swallowing.   Eyes: Negative for visual disturbance.  Respiratory: Negative for cough, choking, chest tightness, shortness of breath and wheezing.  Cardiovascular: Negative for chest pain, palpitations and leg swelling.  Gastrointestinal: Negative for abdominal distention, abdominal pain, constipation, diarrhea, nausea and vomiting.  Endocrine: Positive for polydipsia and polyuria. Negative for polyphagia.  Genitourinary: Negative for dysuria, flank pain, hematuria and urgency.  Musculoskeletal: Negative for back pain, gait problem, myalgias and neck pain.  Skin: Negative for pallor, rash and wound.  Neurological: Negative for seizures, syncope, weakness, numbness and headaches.  Psychiatric/Behavioral: Negative.  Negative for confusion and dysphoric mood.    Objective:    BP 135/80   Pulse 87   Ht 6\' 1"  (1.854 m)   Wt 205 lb (93 kg)   BMI 27.05 kg/m   Wt Readings from Last 3 Encounters:  10/06/17 205 lb (93 kg)  09/28/17 205 lb 6 oz (93.2 kg)  12/10/14 185 lb (83.9 kg)     Physical Exam  Constitutional: He is oriented to person, place, and time. He appears well-developed and well-nourished. He is cooperative. No distress.  HENT:  Head: Normocephalic and atraumatic.  Eyes: EOM are normal.  Neck: Normal range of motion. Neck supple. No tracheal deviation present. No thyromegaly  present.  Cardiovascular: Normal rate, S1 normal and S2 normal. Exam reveals no gallop.  No murmur heard. Pulses:      Dorsalis pedis pulses are 1+ on the right side, and 1+ on the left side.       Posterior tibial pulses are 1+ on the right side, and 1+ on the left side.  Pulmonary/Chest: Effort normal. No respiratory distress. He has no wheezes.  Abdominal: He exhibits no distension. There is no tenderness. There is no guarding and no CVA tenderness.  Musculoskeletal: He exhibits no edema.       Right shoulder: He exhibits no swelling and no deformity.  Neurological: He is alert and oriented to person, place, and time. He has normal strength and normal reflexes. No cranial nerve deficit or sensory deficit. Gait normal.  Skin: Skin is warm and dry. No rash noted. No cyanosis. Nails show no clubbing.  Psychiatric: He has a normal mood and affect. His speech is normal. Judgment normal. Cognition and memory are normal.     Recent Results (from the past 2160 hour(s))  Hemoglobin A1c     Status: Abnormal   Collection Time: 09/28/17  3:39 PM  Result Value Ref Range   Hgb A1c MFr Bld 7.1 (H) <5.7 % of total Hgb    Comment: For someone without known diabetes, a hemoglobin A1c value of 6.5% or greater indicates that they may have  diabetes and this should be confirmed with a follow-up  test. . For someone with known diabetes, a value <7% indicates  that their diabetes is well controlled and a value  greater than or equal to 7% indicates suboptimal  control. A1c targets should be individualized based on  duration of diabetes, age, comorbid conditions, and  other considerations. . Currently, no consensus exists regarding use of hemoglobin A1c for diagnosis of diabetes for children. .    Mean Plasma Glucose 157 (calc)   eAG (mmol/L) 8.7 (calc)  TSH     Status: None   Collection Time: 09/28/17  3:39 PM  Result Value Ref Range   TSH 1.47 0.40 - 4.50 mIU/L  T4, free     Status: None    Collection Time: 09/28/17  3:39 PM  Result Value Ref Range   Free T4 1.0 0.8 - 1.8 ng/dL  Vitamin Z61     Status: None  Collection Time: 09/28/17  3:39 PM  Result Value Ref Range   Vitamin B-12 424 200 - 1,100 pg/mL  VITAMIN D 25 Hydroxy (Vit-D Deficiency, Fractures)     Status: Abnormal   Collection Time: 09/28/17  3:39 PM  Result Value Ref Range   Vit D, 25-Hydroxy 24 (L) 30 - 100 ng/mL    Comment: Vitamin D Status         25-OH Vitamin D: . Deficiency:                    <20 ng/mL Insufficiency:             20 - 29 ng/mL Optimal:                 > or = 30 ng/mL . For 25-OH Vitamin D testing on patients on  D2-supplementation and patients for whom quantitation  of D2 and D3 fractions is required, the QuestAssureD(TM) 25-OH VIT D, (D2,D3), LC/MS/MS is recommended: order  code 16109 (patients >36yrs). . For more information on this test, go to: http://education.questdiagnostics.com/faq/FAQ163 (This link is being provided for  informational/educational purposes only.)   Microalbumin / creatinine urine ratio     Status: None   Collection Time: 09/28/17  3:39 PM  Result Value Ref Range   Creatinine, Urine 166 20 - 320 mg/dL   Microalb, Ur 1.7 mg/dL    Comment: Reference Range Not established    Microalb Creat Ratio 10 <30 mcg/mg creat    Comment: . The ADA defines abnormalities in albumin excretion as follows: Marland Kitchen Category         Result (mcg/mg creatinine) . Normal                    <30 Microalbuminuria         30-299  Clinical albuminuria   > OR = 300 . The ADA recommends that at least two of three specimens collected within a 3-6 month period be abnormal before considering a patient to be within a diagnostic category.   COMPLETE METABOLIC PANEL WITH GFR     Status: Abnormal   Collection Time: 09/28/17  3:39 PM  Result Value Ref Range   Glucose, Bld 170 (H) 65 - 139 mg/dL    Comment: .        Non-fasting reference interval .    BUN 26 (H) 7 - 25 mg/dL    Creat 6.04 5.40 - 9.81 mg/dL   GFR, Est Non African American 97 > OR = 60 mL/min/1.84m2   GFR, Est African American 112 > OR = 60 mL/min/1.8m2   BUN/Creatinine Ratio 27 (H) 6 - 22 (calc)   Sodium 137 135 - 146 mmol/L   Potassium 4.2 3.5 - 5.3 mmol/L   Chloride 103 98 - 110 mmol/L   CO2 25 20 - 32 mmol/L   Calcium 9.2 8.6 - 10.3 mg/dL   Total Protein 6.8 6.1 - 8.1 g/dL   Albumin 4.4 3.6 - 5.1 g/dL   Globulin 2.4 1.9 - 3.7 g/dL (calc)   AG Ratio 1.8 1.0 - 2.5 (calc)   Total Bilirubin 0.3 0.2 - 1.2 mg/dL   Alkaline phosphatase (APISO) 71 40 - 115 U/L   AST 15 10 - 40 U/L   ALT 18 9 - 46 U/L     Assessment & Plan:   1. Uncontrolled type 1 diabetes mellitus with hyperglycemia (HCC)  - Jesse Rodgers has currently uncontrolled symptomatic type 1 DM since  42 years of age.  He denies any history of diabetes ketoacidosis. - He returns with significantly fluctuating blood glucose profile, overall improving.  His recent labs show A1c of 7.1%.    Recent labs reviewed.  -his diabetes is complicated by chronic heavy smoking and Jesse Rodgers remains at a high risk for more acute and chronic complications which include CAD, CVA, CKD, retinopathy, and neuropathy. These are all discussed in detail with the patient.  - I have counseled him on diet management by adopting a carbohydrate restricted/protein rich diet.  -  Suggestion is made for him to avoid simple carbohydrates  from his diet including Cakes, Sweet Desserts / Pastries, Ice Cream, Soda (diet and regular), Sweet Tea, Candies, Chips, Cookies, Store Bought Juices, Alcohol in Excess of  1-2 drinks a day, Artificial Sweeteners, and "Sugar-free" Products. This will help patient to have stable blood glucose profile and potentially avoid unintended weight gain.   - I encouraged him to switch to  unprocessed or minimally processed complex starch and increased protein intake (animal or plant source), fruits, and vegetables.  - he is  advised to stick to a routine mealtimes to eat 3 meals  a day and avoid unnecessary snacks ( to snack only to correct hypoglycemia).   - he will be scheduled with Norm Salt, RDN, CDE for individualized diabetes education.  - I have approached him with the following individualized plan to manage diabetes and patient agrees:   -He is comfortable using his basal/bolus insulin program.   -He will continue to require intensive treatment with insulin.    -I advised him to go to the pharmacy to get his new or insulin prescriptions, still using the samples given to him from the clinic.   -I advised him to continue with Tresiba 30 units nightly, continue Humalog at 5 units  3 times a day with meals  for pre-meal BG readings of 70-150mg /dl, plus patient specific correction dose for unexpected hyperglycemia above 150mg /dl, associated with strict monitoring of glucose 4 times a day-before meals and at bedtime.  - Patient is warned not to take insulin without proper monitoring per orders. -Adjustment parameters are given for hypo and hyperglycemia in writing. -Patient is encouraged to call clinic for blood glucose levels less than 70 or above 300 mg /dl. - Patient specific target  A1c;  LDL, HDL, Triglycerides, and  Waist Circumference were discussed in detail.  2) BP/HTN: His blood pressure is controlled to target.  He is advised to continue his current blood pressure medications including losartan 25 mg p.o. daily.   3) Lipids/HPL: No recent lipid panel to review.  He is advised to continue his simvastatin 40 mg p.o. Nightly.  He will be considered for fasting lipid panel on subsequent visits.  4)  Weight/Diet: CDE Consult will be initiated , exercise, and detailed carbohydrates information provided.  5) Chronic Care/Health Maintenance:  -he  is on ARB and Statin medications and  is encouraged to initiate and continue to follow up with Ophthalmology, Dentist,  Podiatrist at least yearly or  according to recommendations, and he is extensively counseled against smoking.   have recommended yearly flu vaccine and pneumonia vaccine at least every 5 years; moderate intensity exercise for up to 150 minutes weekly; and  sleep for at least 7 hours a day.  - I advised patient to maintain close follow up with Avis Epley, PA-C for primary care needs.  - Time spent with the patient: 25 min, of which >  50% was spent in reviewing his blood glucose logs , discussing his hypo- and hyper-glycemic episodes, reviewing his current and  previous labs and insulin doses and developing a plan to avoid hypo- and hyper-glycemia. Please refer to Patient Instructions for Blood Glucose Monitoring and Insulin/Medications Dosing Guide"  in media tab for additional information. Lawana Pai participated in the discussions, expressed understanding, and voiced agreement with the above plans.  All questions were answered to his satisfaction. he is encouraged to contact clinic should he have any questions or concerns prior to his return visit.   Follow up plan: - Return in about 3 months (around 01/06/2018) for Meter, and Logs, Follow up with Pre-visit Labs, Meter, and Logs.  Marquis Lunch, MD [facility] [address] [place], [place] [address] Phone: [phone]  Fax: [phone]    10/06/2017, 6:32 PM  This note was partially dictated with voice recognition software. Similar sounding words can be transcribed inadequately or may not  be corrected upon review.

## 2017-10-06 NOTE — Patient Instructions (Signed)

## 2018-01-06 ENCOUNTER — Encounter: Payer: Self-pay | Admitting: "Endocrinology

## 2018-01-06 ENCOUNTER — Ambulatory Visit: Payer: BLUE CROSS/BLUE SHIELD | Admitting: "Endocrinology

## 2018-01-06 VITALS — BP 121/81 | HR 101 | Ht 73.0 in | Wt 211.0 lb

## 2018-01-06 DIAGNOSIS — I10 Essential (primary) hypertension: Secondary | ICD-10-CM

## 2018-01-06 DIAGNOSIS — E1065 Type 1 diabetes mellitus with hyperglycemia: Secondary | ICD-10-CM

## 2018-01-06 DIAGNOSIS — F17200 Nicotine dependence, unspecified, uncomplicated: Secondary | ICD-10-CM | POA: Diagnosis not present

## 2018-01-06 DIAGNOSIS — E782 Mixed hyperlipidemia: Secondary | ICD-10-CM

## 2018-01-06 MED ORDER — FREESTYLE LIBRE 14 DAY SENSOR MISC: 1.0000 | 2 refills | Status: DC

## 2018-01-06 MED ORDER — FREESTYLE LIBRE 14 DAY READER DEVI: 1.0000 | Freq: Once | 0 refills | Status: AC

## 2018-01-06 NOTE — Progress Notes (Signed)
Endocrinology follow-up note       01/06/2018, 3:55 PM   Subjective:    Patient ID: Jesse Rodgers, male    DOB: 12-Apr-1975.  Jesse Rodgers is being seen in follow-up for management of currently uncontrolled symptomatic diabetes requested by  Avis Epley, PA-C.   Past Medical History:  Diagnosis Date  . Arthritis    back   . Depression    takes Zoloft daily  . Diabetes mellitus without complication (HCC)    takes Toujeo and Novolog nightly;average fasting blood sugar runs 150  . History of kidney stones   . Hyperlipidemia    takes Simvastatin daily  . Hypertension    takes Losartan daily   Past Surgical History:  Procedure Laterality Date  . ABDOMINAL SURGERY    . APPENDECTOMY    . BACK SURGERY    . DENTAL SURGERY  12/10/2014   molar tooth #  14,15,16  bone biopsy  of ramus  . PARTIAL COLECTOMY    . TOOTH EXTRACTION N/A 12/10/2014   Procedure: EXTRACTION MOLARS #14,15,16  INTRAORAL AND EXTRAORAL INCISION AND DRAINAGE AND BONE BIOPSY OF LEFT RAMUS;  Surgeon: [name], DDS;  Location: [facility] OR;  Service: Oral Surgery;  Laterality: N/A;   Social History   Socioeconomic History  . Marital status: Married    Spouse name: Not on file  . Number of children: Not on file  . Years of education: Not on file  . Highest education level: Not on file  Occupational History  . Not on file  Social Needs  . Financial resource strain: Not on file  . Food insecurity:    Worry: Not on file    Inability: Not on file  . Transportation needs:    Medical: Not on file    Non-medical: Not on file  Tobacco Use  . Smoking status: Current Every Day Smoker    Packs/day: 1.00    Years: 15.00    Pack years: 15.00  . Smokeless tobacco: Never Used  Substance and Sexual Activity  . Alcohol use: Yes    Comment: occasional  . Drug use: No  . Sexual activity: Yes  Lifestyle  . Physical  activity:    Days per week: Not on file    Minutes per session: Not on file  . Stress: Not on file  Relationships  . Social connections:    Talks on phone: Not on file    Gets together: Not on file    Attends religious service: Not on file    Active member of club or organization: Not on file    Attends meetings of clubs or organizations: Not on file    Relationship status: Not on file  Other Topics Concern  . Not on file  Social History Narrative  . Not on file   Outpatient Encounter Medications as of 01/06/2018  Medication Sig  . GLUCOSAMINE HCL PO Take by mouth daily.  . Omega-3 Fatty Acids (OMEGA 3 PO) Take by mouth daily.  . Continuous Blood Gluc Receiver (FREESTYLE LIBRE 14 DAY READER) DEVI 1 each by Does not apply route once for  1 dose.  . Continuous Blood Gluc Sensor (FREESTYLE LIBRE 14 DAY SENSOR) MISC Inject 1 each into the skin every 14 (fourteen) days. Use as directed.  . insulin degludec (TRESIBA FLEXTOUCH) 100 UNIT/ML SOPN FlexTouch Pen Inject 0.3 mLs (30 Units total) into the skin at bedtime.  . insulin lispro (HUMALOG KWIKPEN) 100 UNIT/ML KiwkPen Inject 5-11 Units into the skin 3 (three) times daily.  Marland Kitchen losartan (COZAAR) 25 MG tablet Take 25 mg by mouth daily.  . Multiple Vitamin (MULTIVITAMIN WITH MINERALS) TABS tablet Take 1 tablet by mouth daily.  . simvastatin (ZOCOR) 40 MG tablet Take 40 mg by mouth daily.   . [DISCONTINUED] celecoxib (CELEBREX) 200 MG capsule TK 1 C PO QD WF  . [DISCONTINUED] Cinnamon 500 MG TABS Take 2,000 mg by mouth every morning.  . [DISCONTINUED] Garlic 1000 MG CAPS Take 2,000 mg by mouth every morning.  . [DISCONTINUED] ibuprofen (ADVIL,MOTRIN) 200 MG tablet Take 2 tablets (400 mg total) by mouth every 8 (eight) hours.  . [DISCONTINUED] oxyCODONE-acetaminophen (PERCOCET) 10-325 MG per tablet Take 1 tablet by mouth every 6 (six) hours as needed for pain.  . [DISCONTINUED] oxyCODONE-acetaminophen (PERCOCET/ROXICET) 5-325 MG per tablet Take  1-2 tablets by mouth every 6 (six) hours as needed for moderate pain. (Patient not taking: Reported on 09/28/2017)  . [DISCONTINUED] sertraline (ZOLOFT) 100 MG tablet Take 100 mg by mouth daily.   No facility-administered encounter medications on file as of 01/06/2018.     ALLERGIES: No Known Allergies  VACCINATION STATUS: Immunization History  Administered Date(s) Administered  . Influenza-Unspecified 11/23/2014    Diabetes  He presents for his follow-up diabetic visit. He has type 1 diabetes mellitus. Onset time: He was diagnosed at approximate age of 8 years. His disease course has been worsening. There are no hypoglycemic associated symptoms. Pertinent negatives for hypoglycemia include no confusion, headaches, pallor or seizures. Associated symptoms include polydipsia and polyuria. Pertinent negatives for diabetes include no chest pain, no fatigue, no polyphagia and no weakness. There are no hypoglycemic complications. Symptoms are worsening. There are no diabetic complications. Risk factors for coronary artery disease include dyslipidemia, diabetes mellitus, hypertension, family history, male sex, tobacco exposure and sedentary lifestyle. Current diabetic treatment includes insulin injections (He is currently on Lantus 50 units nightly, Humalog sliding scale 3 times a day.). He is compliant with treatment most of the time. His weight is increasing steadily. He is following a generally unhealthy diet. When asked about meal planning, he reported none. He has not had a previous visit with a dietitian. He never participates in exercise. His home blood glucose trend is increasing rapidly. His overall blood glucose range is 180-200 mg/dl. (He monitored inadequately, 1-2 times on average daily.  His recent A1c was 8% increasing from 7.1%. He did not bring his insulin administration records to review.   ) An ACE inhibitor/angiotensin II receptor blocker is being taken. Eye exam is current.   Hyperlipidemia  This is a chronic problem. The current episode started more than 1 year ago. Exacerbating diseases include diabetes. Pertinent negatives include no chest pain, myalgias or shortness of breath. Risk factors for coronary artery disease include diabetes mellitus, dyslipidemia, hypertension, male sex and a sedentary lifestyle.  Hypertension  This is a chronic problem. The problem is controlled. Pertinent negatives include no chest pain, headaches, neck pain, palpitations or shortness of breath. Risk factors for coronary artery disease include diabetes mellitus, dyslipidemia, male gender, smoking/tobacco exposure and sedentary lifestyle. Past treatments include ACE inhibitors.  Review of Systems  Constitutional: Negative for chills, fatigue, fever and unexpected weight change.  HENT: Negative for dental problem, mouth sores and trouble swallowing.   Eyes: Negative for visual disturbance.  Respiratory: Negative for cough, choking, chest tightness, shortness of breath and wheezing.   Cardiovascular: Negative for chest pain, palpitations and leg swelling.  Gastrointestinal: Negative for abdominal distention, abdominal pain, constipation, diarrhea, nausea and vomiting.  Endocrine: Positive for polydipsia and polyuria. Negative for polyphagia.  Genitourinary: Negative for dysuria, flank pain, hematuria and urgency.  Musculoskeletal: Negative for back pain, gait problem, myalgias and neck pain.  Skin: Negative for pallor, rash and wound.  Neurological: Negative for seizures, syncope, weakness, numbness and headaches.  Psychiatric/Behavioral: Negative.  Negative for confusion and dysphoric mood.    Objective:    BP 121/81   Pulse (!) 101   Ht 6\' 1"  (1.854 m)   Wt 211 lb (95.7 kg)   BMI 27.84 kg/m   Wt Readings from Last 3 Encounters:  01/06/18 211 lb (95.7 kg)  10/06/17 205 lb (93 kg)  09/28/17 205 lb 6 oz (93.2 kg)     Physical Exam  Constitutional: He is oriented to  person, place, and time. He appears well-developed and well-nourished. He is cooperative. No distress.  HENT:  Head: Normocephalic and atraumatic.  Eyes: EOM are normal.  Neck: Normal range of motion. Neck supple. No tracheal deviation present. No thyromegaly present.  Cardiovascular: Normal rate, S1 normal and S2 normal. Exam reveals no gallop.  No murmur heard. Pulses:      Dorsalis pedis pulses are 1+ on the right side, and 1+ on the left side.       Posterior tibial pulses are 1+ on the right side, and 1+ on the left side.  Pulmonary/Chest: Effort normal. No respiratory distress. He has no wheezes.  Abdominal: He exhibits no distension. There is no tenderness. There is no guarding and no CVA tenderness.  Musculoskeletal: He exhibits no edema.       Right shoulder: He exhibits no swelling and no deformity.  Neurological: He is alert and oriented to person, place, and time. He has normal strength and normal reflexes. No cranial nerve deficit or sensory deficit. Gait normal.  Skin: Skin is warm and dry. No rash noted. No cyanosis. Nails show no clubbing.  Psychiatric: He has a normal mood and affect. His speech is normal. Judgment normal. Cognition and memory are normal.   Reported December 08, 2017 A1c was 8%.  Assessment & Plan:   1. Uncontrolled type 1 diabetes mellitus with hyperglycemia (HCC)  - Jesse Rodgers has currently uncontrolled symptomatic type 1 DM since 42 years of age.  He denies any history of diabetes ketoacidosis. - He returns with significantly fluctuating blood glucose profile, overall improving.  He reports that his December 08, 2017 A1c was 8% increasing from  7.1%.   His recent labs are not available to review, states that he will drop them up tomorrow.  -his diabetes is complicated by chronic heavy smoking and VOLNEY REIERSON remains at a high risk for more acute and chronic complications which include CAD, CVA, CKD, retinopathy, and neuropathy. These are all  discussed in detail with the patient.  - I have counseled him on diet management by adopting a carbohydrate restricted/protein rich diet.  -  Suggestion is made for him to avoid simple carbohydrates  from his diet including Cakes, Sweet Desserts / Pastries, Ice Cream, Soda (diet and regular), Sweet Tea, Candies, Chips,  Cookies, Store Bought Juices, Alcohol in Excess of  1-2 drinks a day, Artificial Sweeteners, and "Sugar-free" Products. This will help patient to have stable blood glucose profile and potentially avoid unintended weight gain.  - I encouraged him to switch to  unprocessed or minimally processed complex starch and increased protein intake (animal or plant source), fruits, and vegetables.  - he is advised to stick to a routine mealtimes to eat 3 meals  a day and avoid unnecessary snacks ( to snack only to correct hypoglycemia).   - he will be scheduled with Norm Salt, RDN, CDE for individualized diabetes education.  - I have approached him with the following individualized plan to manage diabetes and patient agrees:   -He has disengaged from self care, did not document his insulin administration records, did not monitor blood glucose adequately.  -This patient will continue to require intensive meant with basal/bolus insulin in order for him to achieve and maintain control of diabetes to target.   -He is advised to resume [place] 30 units nightly, continue Humalog at 5 units  3 times a day with meals  for pre-meal BG readings of 70-150mg /dl, plus patient specific correction dose for unexpected hyperglycemia above 150mg /dl, associated with strict monitoring of glucose 4 times a day-before meals and at bedtime. -He will benefit from CGM .I discussed and prescribed the freestyle libre device for him.  - Patient is warned not to take insulin without proper monitoring per orders. -Adjustment parameters are given for hypo and hyperglycemia in writing. -Patient is encouraged to call  clinic for blood glucose levels less than 70 or above 300 mg /dl. - Patient specific target  A1c;  LDL, HDL, Triglycerides, and  Waist Circumference were discussed in detail.  2) BP/HTN: His blood pressure is controlled to target.  He is advised to continue his current blood pressure medications including losartan 25 mg p.o. daily.   3) Lipids/HPL: No recent lipid panel to review.  He is advised to continue his simvastatin 40 mg p.o. Nightly.  He will be considered for fasting lipid panel on subsequent visits.  4)  Weight/Diet: CDE Consult will be initiated , exercise, and detailed carbohydrates information provided.  5) Chronic Care/Health Maintenance:  -he  is on ARB and Statin medications and  is encouraged to initiate and continue to follow up with Ophthalmology, Dentist,  Podiatrist at least yearly or according to recommendations, and he is extensively counseled against smoking.   have recommended yearly flu vaccine and pneumonia vaccine at least every 5 years; moderate intensity exercise for up to 150 minutes weekly; and  sleep for at least 7 hours a day.  - I advised patient to maintain close follow up with Avis Epley, PA-C for primary care needs.  - Time spent with the patient: 25 min, of which >50% was spent  In discussing his hypo- and hyper-glycemic episodes, reviewing his current and  previous labs and insulin doses and developing a plan to avoid hypo- and hyper-glycemia. Please refer to Patient Instructions for Blood Glucose Monitoring and Insulin/Medications Dosing Guide"  in media tab for additional information. Lawana Pai participated in the discussions, expressed understanding, and voiced agreement with the above plans.  All questions were answered to his satisfaction. he is encouraged to contact clinic should he have any questions or concerns prior to his return visit.   Follow up plan: - Return in about 3 months (around 04/08/2018) for Meter, and Logs, Labs Today-  Non-Fasting Ok.  Marquis Lunch,  MD [facility] [address] [place], [place] [address] Phone: [phone]  Fax: [phone]    01/06/2018, 3:55 PM  This note was partially dictated with voice recognition software. Similar sounding words can be transcribed inadequately or may not  be corrected upon review.

## 2018-01-12 ENCOUNTER — Other Ambulatory Visit: Payer: Self-pay

## 2018-01-12 MED ORDER — FREESTYLE LIBRE 14 DAY SENSOR MISC: 1.0000 | 2 refills | Status: DC

## 2018-01-17 ENCOUNTER — Other Ambulatory Visit: Payer: Self-pay

## 2018-01-17 MED ORDER — INSULIN ASPART 100 UNIT/ML FLEXPEN: 5.0000 [IU] | PEN_INJECTOR | Freq: Three times a day (TID) | SUBCUTANEOUS | 2 refills | Status: DC

## 2018-02-05 ENCOUNTER — Other Ambulatory Visit: Payer: Self-pay | Admitting: "Endocrinology

## 2018-02-10 DIAGNOSIS — E103293 Type 1 diabetes mellitus with mild nonproliferative diabetic retinopathy without macular edema, bilateral: Secondary | ICD-10-CM | POA: Diagnosis not present

## 2018-02-10 DIAGNOSIS — E10319 Type 1 diabetes mellitus with unspecified diabetic retinopathy without macular edema: Secondary | ICD-10-CM | POA: Diagnosis not present

## 2018-02-10 DIAGNOSIS — H47093 Other disorders of optic nerve, not elsewhere classified, bilateral: Secondary | ICD-10-CM | POA: Diagnosis not present

## 2018-02-10 DIAGNOSIS — E109 Type 1 diabetes mellitus without complications: Secondary | ICD-10-CM | POA: Diagnosis not present

## 2018-04-10 ENCOUNTER — Other Ambulatory Visit: Payer: Self-pay | Admitting: "Endocrinology

## 2018-04-11 DIAGNOSIS — E559 Vitamin D deficiency, unspecified: Secondary | ICD-10-CM | POA: Diagnosis not present

## 2018-04-11 DIAGNOSIS — E1065 Type 1 diabetes mellitus with hyperglycemia: Secondary | ICD-10-CM | POA: Diagnosis not present

## 2018-04-12 ENCOUNTER — Ambulatory Visit: Payer: BLUE CROSS/BLUE SHIELD | Admitting: "Endocrinology

## 2018-04-12 LAB — COMPLETE METABOLIC PANEL WITH GFR
AG RATIO: 1.8 (calc) (ref 1.0–2.5)
ALBUMIN MSPROF: 4.6 g/dL (ref 3.6–5.1)
ALKALINE PHOSPHATASE (APISO): 83 U/L (ref 36–130)
ALT: 30 U/L (ref 9–46)
AST: 25 U/L (ref 10–40)
BILIRUBIN TOTAL: 0.4 mg/dL (ref 0.2–1.2)
BUN: 22 mg/dL (ref 7–25)
CHLORIDE: 101 mmol/L (ref 98–110)
CO2: 27 mmol/L (ref 20–32)
Calcium: 9.7 mg/dL (ref 8.6–10.3)
Creat: 0.93 mg/dL (ref 0.60–1.35)
GFR, Est African American: 115 mL/min/{1.73_m2} (ref >=60)
GFR, Est Non African American: 99 mL/min/{1.73_m2} (ref >=60)
GLOBULIN: 2.5 g/dL (ref 1.9–3.7)
Glucose, Bld: 201 mg/dL — ABNORMAL HIGH (ref 65–139)
Potassium: 4.9 mmol/L (ref 3.5–5.3)
Sodium: 138 mmol/L (ref 135–146)
Total Protein: 7.1 g/dL (ref 6.1–8.1)

## 2018-04-12 LAB — HEMOGLOBIN A1C
HEMOGLOBIN A1C: 8.6 %{Hb} — AB (ref <5.7)
MEAN PLASMA GLUCOSE: 200 (calc)
eAG (mmol/L): 11.1 (calc)

## 2018-04-12 LAB — VITAMIN D 25 HYDROXY (VIT D DEFICIENCY, FRACTURES): VIT D 25 HYDROXY: 30 ng/mL (ref 30–100)

## 2018-05-05 ENCOUNTER — Ambulatory Visit (INDEPENDENT_AMBULATORY_CARE_PROVIDER_SITE_OTHER): Payer: BLUE CROSS/BLUE SHIELD | Admitting: "Endocrinology

## 2018-05-05 ENCOUNTER — Encounter: Payer: Self-pay | Admitting: "Endocrinology

## 2018-05-05 VITALS — BP 126/77 | HR 101 | Ht 73.0 in | Wt 214.2 lb

## 2018-05-05 DIAGNOSIS — E109 Type 1 diabetes mellitus without complications: Secondary | ICD-10-CM | POA: Diagnosis not present

## 2018-05-05 DIAGNOSIS — E1065 Type 1 diabetes mellitus with hyperglycemia: Secondary | ICD-10-CM | POA: Diagnosis not present

## 2018-05-05 DIAGNOSIS — E782 Mixed hyperlipidemia: Secondary | ICD-10-CM

## 2018-05-05 DIAGNOSIS — I10 Essential (primary) hypertension: Secondary | ICD-10-CM | POA: Diagnosis not present

## 2018-05-05 DIAGNOSIS — F17200 Nicotine dependence, unspecified, uncomplicated: Secondary | ICD-10-CM | POA: Diagnosis not present

## 2018-05-05 MED ORDER — INSULIN ASPART 100 UNIT/ML FLEXPEN: 6.0000 [IU] | PEN_INJECTOR | Freq: Three times a day (TID) | SUBCUTANEOUS | 2 refills | Status: DC

## 2018-05-05 NOTE — Patient Instructions (Signed)

## 2018-05-05 NOTE — Progress Notes (Signed)
Jesse Rodgers, CMA  

## 2018-05-05 NOTE — Progress Notes (Signed)
Endocrinology follow-up note       05/05/2018, 5:12 PM   Subjective:    Patient ID: Jesse Rodgers, male    DOB: [birth year].  [name] is being seen in follow-up for management of currently uncontrolled symptomatic diabetes requested by  Avis Epley, PA-C.   Past Medical History:  Diagnosis Date  . Arthritis    back   . Depression    takes Zoloft daily  . Diabetes mellitus without complication (HCC)    takes Toujeo and Novolog nightly;average fasting blood sugar runs 150  . History of kidney stones   . Hyperlipidemia    takes Simvastatin daily  . Hypertension    takes Losartan daily   Past Surgical History:  Procedure Laterality Date  . ABDOMINAL SURGERY    . APPENDECTOMY    . BACK SURGERY    . DENTAL SURGERY  12/10/2014   molar tooth #  14,15,16  bone biopsy  of ramus  . PARTIAL COLECTOMY    . TOOTH EXTRACTION N/A 12/10/2014   Procedure: EXTRACTION MOLARS #14,15,16  INTRAORAL AND EXTRAORAL INCISION AND DRAINAGE AND BONE BIOPSY OF LEFT RAMUS;  Surgeon: [name], DDS;  Location: [facility] OR;  Service: Oral Surgery;  Laterality: N/A;   Social History   Socioeconomic History  . Marital status: Married    Spouse name: Not on file  . Number of children: Not on file  . Years of education: Not on file  . Highest education level: Not on file  Occupational History  . Not on file  Social Needs  . Financial resource strain: Not on file  . Food insecurity:    Worry: Not on file    Inability: Not on file  . Transportation needs:    Medical: Not on file    Non-medical: Not on file  Tobacco Use  . Smoking status: Current Every Day Smoker    Packs/day: 1.00    Years: 15.00    Pack years: 15.00  . Smokeless tobacco: Never Used  Substance and Sexual Activity  . Alcohol use: Yes    Comment: occasional  . Drug use: No  . Sexual activity: Yes  Lifestyle  . Physical  activity:    Days per week: Not on file    Minutes per session: Not on file  . Stress: Not on file  Relationships  . Social connections:    Talks on phone: Not on file    Gets together: Not on file    Attends religious service: Not on file    Active member of club or organization: Not on file    Attends meetings of clubs or organizations: Not on file    Relationship status: Not on file  Other Topics Concern  . Not on file  Social History Narrative  . Not on file   Outpatient Encounter Medications as of 05/05/2018  Medication Sig  . Continuous Blood Gluc Sensor (FREESTYLE LIBRE 14 DAY SENSOR) MISC REPLACE SENSOR ON THE SKIN EVERY 14 DAYS  . GLUCOSAMINE HCL PO Take by mouth daily.  . insulin aspart (NOVOLOG) 100 UNIT/ML FlexPen Inject 6-12 Units into the  skin 3 (three) times daily with meals.  Marland Kitchen losartan (COZAAR) 25 MG tablet Take 25 mg by mouth daily.  . Multiple Vitamin (MULTIVITAMIN WITH MINERALS) TABS tablet Take 1 tablet by mouth daily.  . Omega-3 Fatty Acids (OMEGA 3 PO) Take by mouth daily.  . simvastatin (ZOCOR) 40 MG tablet Take 40 mg by mouth daily.   . TRESIBA FLEXTOUCH 100 UNIT/ML SOPN FlexTouch Pen INJECT 30 UNITS TOTAL INTO THE SKIN AT BEDTIME.  . [DISCONTINUED] insulin aspart (NOVOLOG) 100 UNIT/ML FlexPen Inject 5-11 Units into the skin 3 (three) times daily with meals.  . [DISCONTINUED] insulin lispro (HUMALOG KWIKPEN) 100 UNIT/ML KiwkPen Inject 5-11 Units into the skin 3 (three) times daily.   No facility-administered encounter medications on file as of 05/05/2018.     ALLERGIES: No Known Allergies  VACCINATION STATUS: Immunization History  Administered Date(s) Administered  . Influenza-Unspecified 11/23/2014    Diabetes  He presents for his follow-up diabetic visit. He has type 1 diabetes mellitus. Onset time: He was diagnosed at approximate age of 43 years. His disease course has been worsening. There are no hypoglycemic associated symptoms. Pertinent negatives  for hypoglycemia include no confusion, headaches, pallor or seizures. Associated symptoms include polydipsia and polyuria. Pertinent negatives for diabetes include no chest pain, no fatigue, no polyphagia and no weakness. There are no hypoglycemic complications. Symptoms are worsening. There are no diabetic complications. Risk factors for coronary artery disease include dyslipidemia, diabetes mellitus, hypertension, family history, male sex, tobacco exposure and sedentary lifestyle. Current diabetic treatment includes insulin injections (He is currently on Lantus 50 units nightly, Humalog sliding scale 3 times a day.). He is compliant with treatment most of the time. His weight is increasing steadily. He is following a generally unhealthy diet. When asked about meal planning, he reported none. He has not had a previous visit with a dietitian. He never participates in exercise. His home blood glucose trend is fluctuating dramatically. His breakfast blood glucose range is generally 140-180 mg/dl. His lunch blood glucose range is generally 180-200 mg/dl. His dinner blood glucose range is generally 180-200 mg/dl. His overall blood glucose range is 180-200 mg/dl. ( ) An ACE inhibitor/angiotensin II receptor blocker is being taken. Eye exam is current.  Hyperlipidemia  This is a chronic problem. The current episode started more than 1 year ago. Exacerbating diseases include diabetes. Pertinent negatives include no chest pain, myalgias or shortness of breath. Risk factors for coronary artery disease include diabetes mellitus, dyslipidemia, hypertension, male sex and a sedentary lifestyle.  Hypertension  This is a chronic problem. The problem is controlled. Pertinent negatives include no chest pain, headaches, neck pain, palpitations or shortness of breath. Risk factors for coronary artery disease include diabetes mellitus, dyslipidemia, male gender, smoking/tobacco exposure and sedentary lifestyle. Past treatments  include ACE inhibitors.     Review of Systems  Constitutional: Negative for chills, fatigue, fever and unexpected weight change.  HENT: Negative for dental problem, mouth sores and trouble swallowing.   Eyes: Negative for visual disturbance.  Respiratory: Negative for cough, choking, chest tightness, shortness of breath and wheezing.   Cardiovascular: Negative for chest pain, palpitations and leg swelling.  Gastrointestinal: Negative for abdominal distention, abdominal pain, constipation, diarrhea, nausea and vomiting.  Endocrine: Positive for polydipsia and polyuria. Negative for polyphagia.  Genitourinary: Negative for dysuria, flank pain, hematuria and urgency.  Musculoskeletal: Negative for back pain, gait problem, myalgias and neck pain.  Skin: Negative for pallor, rash and wound.  Neurological: Negative for seizures, syncope, weakness,  numbness and headaches.  Psychiatric/Behavioral: Negative.  Negative for confusion and dysphoric mood.    Objective:    BP 126/77   Pulse (!) 101   Ht  (1.854 m)   Wt 214 lb 3.2 oz (97.2 kg)   BMI 28.26 kg/m   Wt Readings from Last 3 Encounters:  05/05/18 214 lb 3.2 oz (97.2 kg)  01/06/18 211 lb (95.7 kg)  10/06/17 205 lb (93 kg)     Physical Exam Constitutional:      General: He is not in acute distress.    Appearance: He is well-developed.  HENT:     Head: Normocephalic and atraumatic.  Neck:     Musculoskeletal: Normal range of motion and neck supple.     Thyroid: No thyromegaly.     Trachea: No tracheal deviation.  Cardiovascular:     Rate and Rhythm: Normal rate.     Pulses:          Dorsalis pedis pulses are 1+ on the right side and 1+ on the left side.       Posterior tibial pulses are 1+ on the right side and 1+ on the left side.     Heart sounds: S1 normal and S2 normal. No murmur. No gallop.   Pulmonary:     Effort: Pulmonary effort is normal. No respiratory distress.     Breath sounds: No wheezing.  Abdominal:      General: There is no distension.     Tenderness: There is no abdominal tenderness. There is no guarding.  Musculoskeletal:     Right shoulder: He exhibits no swelling and no deformity.  Skin:    General: Skin is warm and dry.     Findings: No rash.     Nails: There is no clubbing.   Neurological:     Mental Status: He is alert and oriented to person, place, and time.     Cranial Nerves: No cranial nerve deficit.     Sensory: No sensory deficit.     Gait: Gait normal.     Deep Tendon Reflexes: Reflexes are normal and symmetric.  Psychiatric:        Speech: Speech normal.        Behavior: Behavior is cooperative.        Judgment: Judgment normal.    Recent Results (from the past 2160 hour(s))  Hemoglobin A1c     Status: Abnormal   Collection Time: 04/11/18  2:59 PM  Result Value Ref Range   Hgb A1c MFr Bld 8.6 (H) <5.7 % of total Hgb    Comment: For someone without known diabetes, a hemoglobin A1c value of 6.5% or greater indicates that they may have  diabetes and this should be confirmed with a follow-up  test. . For someone with known diabetes, a value <7% indicates  that their diabetes is well controlled and a value  greater than or equal to 7% indicates suboptimal  control. A1c targets should be individualized based on  duration of diabetes, age, comorbid conditions, and  other considerations. . Currently, no consensus exists regarding use of hemoglobin A1c for diagnosis of diabetes for children. .    Mean Plasma Glucose 200 (calc)   eAG (mmol/L) 11.1 (calc)  COMPLETE METABOLIC PANEL WITH GFR     Status: Abnormal   Collection Time: 04/11/18  2:59 PM  Result Value Ref Range   Glucose, Bld 201 (H) 65 - 139 mg/dL    Comment: .  Non-fasting reference interval .    BUN 22 7 - 25 mg/dL   Creat 1.61 0.96 - 0.45 mg/dL   GFR, Est Non African American 99 > OR = 60 mL/min/1.8m2   GFR, Est African American 115 > OR = 60 mL/min/1.45m2   BUN/Creatinine Ratio  NOT APPLICABLE 6 - 22 (calc)   Sodium 138 135 - 146 mmol/L   Potassium 4.9 3.5 - 5.3 mmol/L   Chloride 101 98 - 110 mmol/L   CO2 27 20 - 32 mmol/L   Calcium 9.7 8.6 - 10.3 mg/dL   Total Protein 7.1 6.1 - 8.1 g/dL   Albumin 4.6 3.6 - 5.1 g/dL   Globulin 2.5 1.9 - 3.7 g/dL (calc)   AG Ratio 1.8 1.0 - 2.5 (calc)   Total Bilirubin 0.4 0.2 - 1.2 mg/dL   Alkaline phosphatase (APISO) 83 36 - 130 U/L   AST 25 10 - 40 U/L   ALT 30 9 - 46 U/L  VITAMIN D 25 Hydroxy (Vit-D Deficiency, Fractures)     Status: None   Collection Time: 04/11/18  2:59 PM  Result Value Ref Range   Vit D, 25-Hydroxy 30 30 - 100 ng/mL    Comment: Vitamin D Status         25-OH Vitamin D: . Deficiency:                    <20 ng/mL Insufficiency:             20 - 29 ng/mL Optimal:                 > or = 30 ng/mL . For 25-OH Vitamin D testing on patients on  D2-supplementation and patients for whom quantitation  of D2 and D3 fractions is required, the QuestAssureD(TM) 25-OH VIT D, (D2,D3), LC/MS/MS is recommended: order  code 40981 (patients >37yrs). . For more information on this test, go to: http://education.questdiagnostics.com/faq/FAQ163 (This link is being provided for  informational/educational purposes only.)      Reported December 08, 2017 A1c was 8%.  Assessment & Plan:   1. Uncontrolled type 1 diabetes mellitus with hyperglycemia (HCC)  - BYRL LATIN has currently uncontrolled symptomatic type 1 DM since 43 years of age.  He denies any history of diabetes ketoacidosis. - He returns with still significantly fluctuating, however improving glucose profile.  His previsit labs are consistent with A1c of 8.6% increasing from 8% during his last visit.    -his diabetes is complicated by chronic heavy smoking and ROBT OKUDA remains at a high risk for more acute and chronic complications which include CAD, CVA, CKD, retinopathy, and neuropathy. These are all discussed in detail with the patient.  - I  have counseled him on diet management by adopting a carbohydrate restricted/protein rich diet.  - Patient admits there is a room for improvement in his diet and drink choices. -  Suggestion is made for him to avoid simple carbohydrates  from his diet including Cakes, Sweet Desserts / Pastries, Ice Cream, Soda (diet and regular), Sweet Tea, Candies, Chips, Cookies, Store Bought Juices, Alcohol in Excess of  1-2 drinks a day, Artificial Sweeteners, and "Sugar-free" Products. This will help patient to have stable blood glucose profile and potentially avoid unintended weight gain.  - I encouraged him to switch to  unprocessed or minimally processed complex starch and increased protein intake (animal or plant source), fruits, and vegetables.  - he is advised to stick to a routine  mealtimes to eat 3 meals  a day and avoid unnecessary snacks ( to snack only to correct hypoglycemia).   - he will be scheduled with Norm Salt, RDN, CDE for individualized diabetes education.  - I have approached him with the following individualized plan to manage diabetes and patient agrees:   -He has clearly benefited from his CGM device, reengaged better.  -He will continue to require intensive treatment with basal/bolus insulin in order for him to achieve and maintain control of diabetes to target.   -He is advised to continue Tresiba 30 units nightly, increase NovoLog to 6  units  3 times a day with meals  for pre-meal BG readings of 70-150mg /dl, plus patient specific correction dose for unexpected hyperglycemia above 150mg /dl, associated with strict monitoring of glucose 4 times a day-before meals and at bedtime.  - Patient is warned not to take insulin without proper monitoring per orders. -Adjustment parameters are given for hypo and hyperglycemia in writing. -Patient is encouraged to call clinic for blood glucose levels less than 70 or above 300 mg /dl. - Patient specific target  A1c;  LDL, HDL, Triglycerides,  and  Waist Circumference were discussed in detail.  2) BP/HTN: His blood pressure is controlled to target.  He is advised to continue his current blood pressure medications including losartan 25 mg p.o. daily.     3) Lipids/HPL:no recent lipid panel to review.  He is advised to continue simvastatin 40 mg p.o. nightly.    He will be considered for fasting lipid panel on subsequent visits.  4)  Weight/Diet: CDE Consult will be initiated , exercise, and detailed carbohydrates information provided.  5) Chronic Care/Health Maintenance:  -he  is on ARB and Statin medications and  is encouraged to initiate and continue to follow up with Ophthalmology, Dentist,  Podiatrist at least yearly or according to recommendations, and he is extensively counseled for smoking cessation.  I   have recommended yearly flu vaccine and pneumonia vaccine at least every 5 years; moderate intensity exercise for up to 150 minutes weekly; and  sleep for at least 7 hours a day.  - I advised patient to maintain close follow up with Avis Epley, PA-C for primary care needs.  - Time spent with the patient: 25 min, of which >50% was spent in reviewing his blood glucose logs , discussing his hypoglycemia and hyperglycemia episodes, reviewing his current and  previous labs / studies and medications  doses and developing a plan to avoid hypoglycemia and hyperglycemia. Please refer to Patient Instructions for Blood Glucose Monitoring and Insulin/Medications Dosing Guide"  in media tab for additional information. Lawana Pai participated in the discussions, expressed understanding, and voiced agreement with the above plans.  All questions were answered to his satisfaction. he is encouraged to contact clinic should he have any questions or concerns prior to his return visit.  Follow up plan: - Return in about 4 months (around 09/04/2018) for Meter, and Logs.  Marquis Lunch, MD [facility] [address] [place], [place] [address] Phone: [phone]  Fax: [phone]    05/05/2018, 5:12 PM  This note was partially dictated with voice recognition software. Similar sounding words can be transcribed inadequately or may not  be corrected upon review.

## 2018-05-12 DIAGNOSIS — Z6826 Body mass index (BMI) 26.0-26.9, adult: Secondary | ICD-10-CM | POA: Diagnosis not present

## 2018-05-12 DIAGNOSIS — R454 Irritability and anger: Secondary | ICD-10-CM | POA: Diagnosis not present

## 2018-05-12 DIAGNOSIS — I10 Essential (primary) hypertension: Secondary | ICD-10-CM | POA: Diagnosis not present

## 2018-05-12 DIAGNOSIS — Z1389 Encounter for screening for other disorder: Secondary | ICD-10-CM | POA: Diagnosis not present

## 2018-05-12 DIAGNOSIS — E663 Overweight: Secondary | ICD-10-CM | POA: Diagnosis not present

## 2018-06-06 ENCOUNTER — Other Ambulatory Visit: Payer: Self-pay

## 2018-06-06 MED ORDER — FREESTYLE LIBRE 14 DAY SENSOR MISC: 1.0000 | 2 refills | Status: DC

## 2018-06-06 MED ORDER — INSULIN ASPART 100 UNIT/ML FLEXPEN: 6.0000 [IU] | PEN_INJECTOR | Freq: Three times a day (TID) | SUBCUTANEOUS | 0 refills | Status: DC

## 2018-06-06 MED ORDER — INSULIN DEGLUDEC 100 UNIT/ML ~~LOC~~ SOPN: PEN_INJECTOR | SUBCUTANEOUS | 0 refills | Status: DC

## 2018-06-10 ENCOUNTER — Other Ambulatory Visit: Payer: Self-pay | Admitting: "Endocrinology

## 2018-08-22 ENCOUNTER — Ambulatory Visit: Payer: Self-pay

## 2018-08-22 NOTE — Telephone Encounter (Signed)
   Reason for Disposition . 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 CLOSE CONTACT: "Who is the person with the confirmed or suspected COVID-19 infection that you were exposed to?"    yes 2. PLACE of CONTACT: "Where were you when you were exposed to COVID-19?" (e.g., home, school, medical waiting room; which city?)     work 3. TYPE of CONTACT: "How much contact was there?" (e.g., sitting next to, live in same house, work in same office, same building)     8hours 4. DURATION of CONTACT: "How long were you in contact with the COVID-19 patient?" (e.g., a few seconds, passed by person, a few minutes, live with the patient)     Passed  5. DATE of CONTACT: "When did you have contact with a COVID-19 patient?" (e.g., how many days ago)     yesterday 6. TRAVEL: "Have you traveled out of the country recently?" If so, "When and where?"     * Also ask about out-of-state travel, since the CDC has identified some high-risk cities for community spread in the [place].     * Note: Travel becomes less relevant if there is widespread community transmission where the patient lives.     denies 7. COMMUNITY SPREAD: "Are there lots of cases of COVID-19 (community spread) where you live?" (See public health department website, if unsure)        8. SYMPTOMS: "Do you have any symptoms?" (e.g., fever, cough, breathing difficulty)     denies 9. PREGNANCY OR POSTPARTUM: "Is there any chance you are pregnant?" "When was your last menstrual period?" "Did you deliver in the last 2 weeks?"     na 10. HIGH RISK: "Do you have any heart or lung problems? Do you have a weak immune system?" (e.g., CHF, COPD, asthma, HIV positive, chemotherapy, renal failure, diabetes  mellitus, sickle cell anemia)  Protocols used: CORONAVIRUS (COVID-19) EXPOSURE-A-AH

## 2018-08-23 ENCOUNTER — Other Ambulatory Visit: Payer: Self-pay

## 2018-08-23 ENCOUNTER — Other Ambulatory Visit: Payer: Self-pay | Admitting: Internal Medicine

## 2018-08-23 DIAGNOSIS — Z20822 Contact with and (suspected) exposure to covid-19: Secondary | ICD-10-CM

## 2018-08-25 LAB — NOVEL CORONAVIRUS, NAA: SARS-CoV-2, NAA: NOT DETECTED

## 2018-08-30 DIAGNOSIS — E1065 Type 1 diabetes mellitus with hyperglycemia: Secondary | ICD-10-CM | POA: Diagnosis not present

## 2018-08-31 LAB — COMPLETE METABOLIC PANEL WITH GFR
AG RATIO: 1.8 (calc) (ref 1.0–2.5)
ALT: 23 U/L (ref 9–46)
AST: 18 U/L (ref 10–40)
Albumin: 4.4 g/dL (ref 3.6–5.1)
Alkaline phosphatase (APISO): 89 U/L (ref 36–130)
BILIRUBIN TOTAL: 0.6 mg/dL (ref 0.2–1.2)
BUN: 19 mg/dL (ref 7–25)
CALCIUM: 9.4 mg/dL (ref 8.6–10.3)
CO2: 27 mmol/L (ref 20–32)
CREATININE: 0.89 mg/dL (ref 0.60–1.35)
Chloride: 102 mmol/L (ref 98–110)
GFR, EST AFRICAN AMERICAN: 120 mL/min/{1.73_m2} (ref >=60)
GFR, EST NON AFRICAN AMERICAN: 103 mL/min/{1.73_m2} (ref >=60)
Globulin: 2.4 g/dL (calc) (ref 1.9–3.7)
Glucose, Bld: 224 mg/dL — ABNORMAL HIGH (ref 65–139)
POTASSIUM: 4.2 mmol/L (ref 3.5–5.3)
SODIUM: 137 mmol/L (ref 135–146)
Total Protein: 6.8 g/dL (ref 6.1–8.1)

## 2018-08-31 LAB — TSH: TSH: 2.47 mIU/L (ref 0.40–4.50)

## 2018-08-31 LAB — HEMOGLOBIN A1C
Hgb A1c MFr Bld: 8.6 % of total Hgb — ABNORMAL HIGH (ref <5.7)
MEAN PLASMA GLUCOSE: 200 (calc)
eAG (mmol/L): 11.1 (calc)

## 2018-08-31 LAB — T4, FREE: FREE T4: 1.4 ng/dL (ref 0.8–1.8)

## 2018-09-05 DIAGNOSIS — Z1389 Encounter for screening for other disorder: Secondary | ICD-10-CM | POA: Diagnosis not present

## 2018-09-05 DIAGNOSIS — E109 Type 1 diabetes mellitus without complications: Secondary | ICD-10-CM | POA: Diagnosis not present

## 2018-09-05 DIAGNOSIS — E663 Overweight: Secondary | ICD-10-CM | POA: Diagnosis not present

## 2018-09-05 DIAGNOSIS — Z6825 Body mass index (BMI) 25.0-25.9, adult: Secondary | ICD-10-CM | POA: Diagnosis not present

## 2018-09-29 ENCOUNTER — Ambulatory Visit (INDEPENDENT_AMBULATORY_CARE_PROVIDER_SITE_OTHER): Payer: BC Managed Care – PPO | Admitting: "Endocrinology

## 2018-09-29 ENCOUNTER — Encounter: Payer: Self-pay | Admitting: "Endocrinology

## 2018-09-29 DIAGNOSIS — I10 Essential (primary) hypertension: Secondary | ICD-10-CM | POA: Diagnosis not present

## 2018-09-29 DIAGNOSIS — E1065 Type 1 diabetes mellitus with hyperglycemia: Secondary | ICD-10-CM

## 2018-09-29 DIAGNOSIS — E782 Mixed hyperlipidemia: Secondary | ICD-10-CM

## 2018-09-29 MED ORDER — NOVOLOG FLEXPEN 100 UNIT/ML ~~LOC~~ SOPN: PEN_INJECTOR | SUBCUTANEOUS | 4 refills | Status: DC

## 2018-09-29 MED ORDER — GLUCAGON EMERGENCY 1 MG IJ KIT: 1.0000 mg | PACK | Freq: Once | INTRAMUSCULAR | 12 refills | Status: DC | PRN

## 2018-09-29 NOTE — Progress Notes (Signed)
09/29/2018, 5:30 PM                                Endocrinology Telehealth Visit Follow up Note -During COVID -19 Pandemic  I connected with Jesse PaiBradley E Ohlin on 09/29/2018   by telephone and verified that I am speaking with the correct person using two identifiers. Jesse Rodgers, 1975-08-19. he has verbally consented to this visit. All issues noted in this document were discussed and addressed. The format was not optimal for physical exam.   Subjective:    Patient ID: Jesse PaiBradley E Gubser, male    DOB: 1975-08-19.  Jesse Rodgers is being engaged in telehealth via telephone in follow-up for management of currently uncontrolled symptomatic diabetes requested by  Avis EpleyJackson, Samantha J, PA-C.   Past Medical History:  Diagnosis Date  . Arthritis    back   . Depression    takes Zoloft daily  . Diabetes mellitus without complication (HCC)    takes Toujeo and Novolog nightly;average fasting blood sugar runs 150  . History of kidney stones   . Hyperlipidemia    takes Simvastatin daily  . Hypertension    takes Losartan daily   Past Surgical History:  Procedure Laterality Date  . ABDOMINAL SURGERY    . APPENDECTOMY    . BACK SURGERY    . DENTAL SURGERY  12/10/2014   molar tooth #  14,15,16  bone biopsy  of ramus  . PARTIAL COLECTOMY    . TOOTH EXTRACTION N/A 12/10/2014   Procedure: EXTRACTION MOLARS #14,15,16  INTRAORAL AND EXTRAORAL INCISION AND DRAINAGE AND BONE BIOPSY OF LEFT RAMUS;  Surgeon: Lincoln Brighamhristopher El Brazil, DDS;  Location: [facility] OR;  Service: Oral Surgery;  Laterality: N/A;   Social History   Socioeconomic History  . Marital status: Married    Spouse name: Not on file  . Number of children: Not on file  . Years of education: Not on file  . Highest education level: Not on file  Occupational History  . Not on file  Social Needs  . Financial resource strain: Not on file  . Food insecurity     Worry: Not on file    Inability: Not on file  . Transportation needs    Medical: Not on file    Non-medical: Not on file  Tobacco Use  . Smoking status: Current Every Day Smoker    Packs/day: 1.00    Years: 15.00    Pack years: 15.00  . Smokeless tobacco: Never Used  Substance and Sexual Activity  . Alcohol use: Yes    Comment: occasional  . Drug use: No  . Sexual activity: Yes  Lifestyle  . Physical activity    Days per week: Not on file    Minutes per session: Not on file  . Stress: Not on file  Relationships  . Social Musicianconnections    Talks on phone: Not on file    Gets together: Not on file    Attends religious service: Not on file    Active member of club  or organization: Not on file    Attends meetings of clubs or organizations: Not on file    Relationship status: Not on file  Other Topics Concern  . Not on file  Social History Narrative  . Not on file   Outpatient Encounter Medications as of 09/29/2018  Medication Sig  . Continuous Blood Gluc Sensor (FREESTYLE LIBRE 14 DAY SENSOR) MISC 1 each by Other route every 14 (fourteen) days.  Marland Kitchen glucagon (GLUCAGON EMERGENCY) 1 MG injection Inject 1 mg into the vein once as needed.  Marland Kitchen GLUCOSAMINE HCL PO Take by mouth daily.  . insulin aspart (NOVOLOG FLEXPEN) 100 UNIT/ML FlexPen INJECT 5-11 UNITS INTO THE SKIN 3 (THREE) TIMES DAILY WITH MEALS.  Marland Kitchen insulin degludec (TRESIBA FLEXTOUCH) 100 UNIT/ML SOPN FlexTouch Pen INJECT 30 UNITS TOTAL INTO THE SKIN AT BEDTIME.  Marland Kitchen losartan (COZAAR) 25 MG tablet Take 25 mg by mouth daily.  . Multiple Vitamin (MULTIVITAMIN WITH MINERALS) TABS tablet Take 1 tablet by mouth daily.  . Omega-3 Fatty Acids (OMEGA 3 PO) Take by mouth daily.  . simvastatin (ZOCOR) 40 MG tablet Take 40 mg by mouth daily.   . [DISCONTINUED] NOVOLOG FLEXPEN 100 UNIT/ML FlexPen INJECT 5-11 UNITS INTO THE SKIN 3 (THREE) TIMES DAILY WITH MEALS.   No facility-administered encounter medications on file as of 09/29/2018.      ALLERGIES: No Known Allergies  VACCINATION STATUS: Immunization History  Administered Date(s) Administered  . Influenza-Unspecified 11/23/2014    Diabetes He presents for his follow-up diabetic visit. He has type 1 diabetes mellitus. Onset time: He was diagnosed at approximate age of 21 years. His disease course has been worsening. There are no hypoglycemic associated symptoms. Pertinent negatives for hypoglycemia include no confusion, headaches, pallor or seizures. Associated symptoms include polydipsia and polyuria. Pertinent negatives for diabetes include no chest pain, no fatigue, no polyphagia and no weakness. There are no hypoglycemic complications. Symptoms are stable. There are no diabetic complications. Risk factors for coronary artery disease include dyslipidemia, diabetes mellitus, hypertension, family history, male sex, tobacco exposure and sedentary lifestyle. Current diabetic treatment includes insulin injections (He is currently on Lantus 50 units nightly, Humalog sliding scale 3 times a day.). He is compliant with treatment most of the time. His weight is increasing steadily. He is following a generally unhealthy diet. When asked about meal planning, he reported none. He has not had a previous visit with a dietitian. He never participates in exercise. His home blood glucose trend is fluctuating dramatically. His breakfast blood glucose range is generally 140-180 mg/dl. His lunch blood glucose range is generally 180-200 mg/dl. His dinner blood glucose range is generally 180-200 mg/dl. His overall blood glucose range is 180-200 mg/dl. (He still reports significantly fluctuating blood glucose profile ranging from 61-267 at fasting, 77-195 at lunch, 97-392 supper.  His previsit labs show A1c of 8.6% unchanged from his last visit.) An ACE inhibitor/angiotensin II receptor blocker is being taken. Eye exam is current.  Hyperlipidemia This is a chronic problem. The current episode started  more than 1 year ago. Exacerbating diseases include diabetes. Pertinent negatives include no chest pain, myalgias or shortness of breath. Risk factors for coronary artery disease include diabetes mellitus, dyslipidemia, hypertension, male sex and a sedentary lifestyle.  Hypertension This is a chronic problem. The problem is controlled. Pertinent negatives include no chest pain, headaches, neck pain, palpitations or shortness of breath. Risk factors for coronary artery disease include diabetes mellitus, dyslipidemia, male gender, smoking/tobacco exposure and sedentary lifestyle. Past treatments include  ACE inhibitors.     Review of Systems  Constitutional: Negative for chills, fatigue, fever and unexpected weight change.  HENT: Negative for dental problem, mouth sores and trouble swallowing.   Eyes: Negative for visual disturbance.  Respiratory: Negative for cough, choking, chest tightness, shortness of breath and wheezing.   Cardiovascular: Negative for chest pain, palpitations and leg swelling.  Gastrointestinal: Negative for abdominal distention, abdominal pain, constipation, diarrhea, nausea and vomiting.  Endocrine: Positive for polydipsia and polyuria. Negative for polyphagia.  Genitourinary: Negative for dysuria, flank pain, hematuria and urgency.  Musculoskeletal: Negative for back pain, gait problem, myalgias and neck pain.  Skin: Negative for pallor, rash and wound.  Neurological: Negative for seizures, syncope, weakness, numbness and headaches.  Psychiatric/Behavioral: Negative.  Negative for confusion and dysphoric mood.    Objective:    There were no vitals taken for this visit.  Wt Readings from Last 3 Encounters:  05/05/18 214 lb 3.2 oz (97.2 kg)  01/06/18 211 lb (95.7 kg)  10/06/17 205 lb (93 kg)     Recent Results (from the past 2160 hour(s))  Novel Coronavirus, NAA ([facility])     Status: None   Collection Time: 08/23/18  3:40 PM   Specimen: Nasal Swab  Result Value  Ref Range   SARS-CoV-2, NAA Not Detected Not Detected    Comment: Testing was performed using the cobas(R) SARS-CoV-2 test. This test was developed and its performance characteristics determined by [facility]. This test has not been FDA cleared or approved. This test has been authorized by FDA under an Emergency Use Authorization (EUA). This test is only authorized for the duration of time the declaration that circumstances exist justifying the authorization of the emergency use of in vitro diagnostic tests for detection of SARS-CoV-2 virus and/or diagnosis of COVID-19 infection under section 564(b)(1) of the Act, 21 U.S.C. 161WRU-0(A)(5360bbb-3(b)(1), unless the authorization is terminated or revoked sooner. When diagnostic testing is negative, the possibility of a false negative result should be considered in the context of a patient's recent exposures and the presence of clinical signs and symptoms consistent with COVID-19. An individual without symptoms of COVID-19 and who is not shedding SARS-CoV-2 virus would expect to have a negati ve (not detected) result in this assay.   Hemoglobin A1c     Status: Abnormal   Collection Time: 08/30/18 10:55 AM  Result Value Ref Range   Hgb A1c MFr Bld 8.6 (H) <5.7 % of total Hgb    Comment: For someone without known diabetes, a hemoglobin A1c value of 6.5% or greater indicates that they may have  diabetes and this should be confirmed with a follow-up  test. . For someone with known diabetes, a value <7% indicates  that their diabetes is well controlled and a value  greater than or equal to 7% indicates suboptimal  control. A1c targets should be individualized based on  duration of diabetes, age, comorbid conditions, and  other considerations. . Currently, no consensus exists regarding use of hemoglobin A1c for diagnosis of diabetes for children. .    Mean Plasma Glucose 200 (calc)   eAG (mmol/L) 11.1 (calc)  COMPLETE METABOLIC PANEL  WITH GFR     Status: Abnormal   Collection Time: 08/30/18 10:55 AM  Result Value Ref Range   Glucose, Bld 224 (H) 65 - 139 mg/dL    Comment: .        Non-fasting reference interval .    BUN 19 7 - 25 mg/dL   Creat 4.090.89 8.110.60 -  1.35 mg/dL   GFR, Est Non African American 103 > OR = 60 mL/min/1.25m2   GFR, Est African American 120 > OR = 60 mL/min/1.26m2   BUN/Creatinine Ratio NOT APPLICABLE 6 - 22 (calc)   Sodium 137 135 - 146 mmol/L   Potassium 4.2 3.5 - 5.3 mmol/L   Chloride 102 98 - 110 mmol/L   CO2 27 20 - 32 mmol/L   Calcium 9.4 8.6 - 10.3 mg/dL   Total Protein 6.8 6.1 - 8.1 g/dL   Albumin 4.4 3.6 - 5.1 g/dL   Globulin 2.4 1.9 - 3.7 g/dL (calc)   AG Ratio 1.8 1.0 - 2.5 (calc)   Total Bilirubin 0.6 0.2 - 1.2 mg/dL   Alkaline phosphatase (APISO) 89 36 - 130 U/L   AST 18 10 - 40 U/L   ALT 23 9 - 46 U/L  TSH     Status: None   Collection Time: 08/30/18 10:55 AM  Result Value Ref Range   TSH 2.47 0.40 - 4.50 mIU/L  T4, free     Status: None   Collection Time: 08/30/18 10:55 AM  Result Value Ref Range   Free T4 1.4 0.8 - 1.8 ng/dL    Assessment & Plan:   1. Uncontrolled type 1 diabetes mellitus with hyperglycemia (HCC)  - MESHULEM ONORATO has currently uncontrolled symptomatic type 1 DM since 43 years of age.  He denies any history of diabetes ketoacidosis. - He reports fluctuating glycemic profile, see above.    His previsit labs are consistent with A1c of 8.6% increasing from 8% during his last visit.    -his diabetes is complicated by chronic heavy smoking and JOURDYN FERRIN remains at a high risk for more acute and chronic complications which include CAD, CVA, CKD, retinopathy, and neuropathy. These are all discussed in detail with the patient.  - I have counseled him on diet management by adopting a carbohydrate restricted/protein rich diet.  - he  admits there is a room for improvement in his diet and drink choices. -  Suggestion is made for him to avoid simple  carbohydrates  from his diet including Cakes, Sweet Desserts / Pastries, Ice Cream, Soda (diet and regular), Sweet Tea, Candies, Chips, Cookies, Sweet Pastries,  Store Bought Juices, Alcohol in Excess of  1-2 drinks a day, Artificial Sweeteners, Coffee Creamer, and "Sugar-free" Products. This will help patient to have stable blood glucose profile and potentially avoid unintended weight gain.  - I encouraged him to switch to  unprocessed or minimally processed complex starch and increased protein intake (animal or plant source), fruits, and vegetables.  - he is advised to stick to a routine mealtimes to eat 3 meals  a day and avoid unnecessary snacks ( to snack only to correct hypoglycemia).   - he will be scheduled with Norm Salt, RDN, CDE for individualized diabetes education.  - I have approached him with the following individualized plan to manage diabetes and patient agrees:   -He has clearly benefited from his CGM device, reengaged better.  -He will continue to require intensive treatment with basal/bolus insulin in order for him to achieve and maintain control of diabetes to target.   -He is advised to increase Tresiba to 34 units nightly, increase his NovoLog to 6 units with breakfast, 8 units with lunch, and 6 units with supper   for pre-meal BG readings of 70-150mg /dl, plus patient specific correction dose for unexpected hyperglycemia above 150mg /dl, associated with strict monitoring of glucose 4 times  a day-before meals and at bedtime.  - Patient is warned not to take insulin without proper monitoring per orders. -Adjustment parameters are given for hypo and hyperglycemia in writing. -Patient is encouraged to call clinic for blood glucose levels less than 70 or above 300 mg /dl. - Patient specific target  A1c;  LDL, HDL, Triglycerides, and  Waist Circumference were discussed in detail.  2) BP/HTN: he is advised to home monitor blood pressure and report if > 140/90 on 2 separate  readings.  He is advised to continue his current blood pressure medications including losartan 25 mg p.o. daily.   3) Lipids/HPL: no recent lipid panel to review.  He is advised to continue simvastatin 40 mg p.o. nightly.  He will be considered for fasting lipid panel on subsequent visits.  4)  Weight/Diet: CDE Consult will be initiated , exercise, and detailed carbohydrates information provided.  5) Chronic Care/Health Maintenance:  -he  is on ARB and Statin medications and  is encouraged to initiate and continue to follow up with Ophthalmology, Dentist,  Podiatrist at least yearly or according to recommendations, and he is extensively counseled for smoking cessation.  I   have recommended yearly flu vaccine and pneumonia vaccine at least every 5 years; moderate intensity exercise for up to 150 minutes weekly; and  sleep for at least 7 hours a day.  - I advised patient to maintain close follow up with Avis EpleyJackson, Samantha J, PA-C for primary care needs.  - Patient Care Time Today:  25 min, of which >50% was spent in  counseling and the rest reviewing his  current and  previous labs/studies, previous treatments, his blood glucose readings, and medications' doses and developing a plan for long-term care based on the latest recommendations for standards of care.   Jesse PaiBradley E Dibello participated in the discussions, expressed understanding, and voiced agreement with the above plans.  All questions were answered to his satisfaction. he is encouraged to contact clinic should he have any questions or concerns prior to his return visit.   Follow up plan: - Return in about 4 months (around 01/30/2019) for Follow up with Pre-visit Labs, Meter, and Logs.  Marquis LunchGebre Nida, MD [facility] [address] [place], [place] [address] Phone: [phone]  Fax: [phone]    09/29/2018, 5:30 PM  This note was partially dictated with voice recognition software.  Similar sounding words can be transcribed inadequately or may not  be corrected upon review.

## 2018-10-22 ENCOUNTER — Other Ambulatory Visit: Payer: Self-pay | Admitting: "Endocrinology

## 2018-12-08 ENCOUNTER — Ambulatory Visit: Payer: Self-pay | Admitting: *Deleted

## 2018-12-08 ENCOUNTER — Other Ambulatory Visit: Payer: Self-pay

## 2018-12-08 DIAGNOSIS — Z20822 Contact with and (suspected) exposure to covid-19: Secondary | ICD-10-CM

## 2018-12-08 NOTE — Telephone Encounter (Signed)
Pt called stating that he has a bad cough, and head/chest congestion; there have been numerous cases at his job, and his last day there was 10/07/2018; he also has vomited x 2 this am; he would like to get a rapid COVID test; recommendations made per nurse triage protocol; pt also informed that community testing is available without MD's order; he was also advised to contact his PCP for further recommendations; he verbalized understanding.  Reason for Disposition . HIGH RISK patient (e.g., age > 45 years, diabetes, heart or lung disease, weak immune system) (Exception: Has already been evaluated by healthcare provider and has no new or worsening symptoms)  Answer Assessment - Initial Assessment Questions 1. COVID-19 DIAGNOSIS: "Who made your Coronavirus (COVID-19) diagnosis?" "Was it confirmed by a positive lab test?" If not diagnosed by a HCP, ask "Are there lots of cases (community spread) where you live?" (See public health department website, if unsure)     Major community spread 2. ONSET: "When did the COVID-19 symptoms start?"      12/07/2018 3. WORST SYMPTOM: "What is your worst symptom?" (e.g., cough, fever, shortness of breath, muscle aches)     Head congestion 4. COUGH: "Do you have a cough?" If so, ask: "How bad is the cough?"      Yes, moderate 5. FEVER: "Do you have a fever?" If so, ask: "What is your temperature, how was it measured, and when did it start?"   Not sure 6. RESPIRATORY STATUS: "Describe your breathing?" (e.g., shortness of breath, wheezing, unable to speak)     Very little shortness of breath 7. BETTER-SAME-WORSE: "Are you getting better, staying the same or getting worse compared to yesterday?"  If getting worse, ask, "In what way?"     worse 8. HIGH RISK DISEASE: "Do you have any chronic medical problems?" (e.g., asthma, heart or lung disease, weak immune system, etc.)    Diabetic, htn 9. PREGNANCY: "Is there any chance you are pregnant?" "When was your last menstrual  period?"     n/a 10. OTHER SYMPTOMS: "Do you have any other symptoms?"  (e.g., chills, fatigue, headache, loss of smell or taste, muscle pain, sore throat)      Head and nasal congestion, vomiting  Protocols used: CORONAVIRUS (COVID-19) DIAGNOSED OR SUSPECTED-A-AH

## 2018-12-10 LAB — NOVEL CORONAVIRUS, NAA: SARS-CoV-2, NAA: NOT DETECTED

## 2019-01-30 ENCOUNTER — Ambulatory Visit: Payer: BC Managed Care – PPO | Admitting: "Endocrinology

## 2019-03-11 ENCOUNTER — Other Ambulatory Visit: Payer: Self-pay | Admitting: "Endocrinology

## 2019-04-11 DIAGNOSIS — E1065 Type 1 diabetes mellitus with hyperglycemia: Secondary | ICD-10-CM | POA: Diagnosis not present

## 2019-04-12 LAB — COMPLETE METABOLIC PANEL WITH GFR
AG Ratio: 1.8 (calc) (ref 1.0–2.5)
ALT: 37 U/L (ref 9–46)
AST: 24 U/L (ref 10–40)
Albumin: 4.2 g/dL (ref 3.6–5.1)
Alkaline phosphatase (APISO): 102 U/L (ref 36–130)
BUN: 22 mg/dL (ref 7–25)
CO2: 29 mmol/L (ref 20–32)
Calcium: 9.7 mg/dL (ref 8.6–10.3)
Chloride: 105 mmol/L (ref 98–110)
Creat: 1.02 mg/dL (ref 0.60–1.35)
GFR, Est African American: 102 mL/min/{1.73_m2} (ref >=60)
GFR, Est Non African American: 88 mL/min/{1.73_m2} (ref >=60)
Globulin: 2.4 g/dL (calc) (ref 1.9–3.7)
Glucose, Bld: 59 mg/dL — ABNORMAL LOW (ref 65–99)
Potassium: 4.6 mmol/L (ref 3.5–5.3)
Sodium: 143 mmol/L (ref 135–146)
Total Bilirubin: 0.3 mg/dL (ref 0.2–1.2)
Total Protein: 6.6 g/dL (ref 6.1–8.1)

## 2019-04-12 LAB — HEMOGLOBIN A1C
Hgb A1c MFr Bld: 8.8 % of total Hgb — ABNORMAL HIGH (ref <5.7)
Mean Plasma Glucose: 206 (calc)
eAG (mmol/L): 11.4 (calc)

## 2019-05-01 ENCOUNTER — Other Ambulatory Visit: Payer: Self-pay | Admitting: "Endocrinology

## 2019-05-02 ENCOUNTER — Other Ambulatory Visit: Payer: Self-pay

## 2019-05-02 ENCOUNTER — Ambulatory Visit: Payer: Self-pay | Attending: Internal Medicine

## 2019-05-02 DIAGNOSIS — U071 COVID-19: Secondary | ICD-10-CM | POA: Insufficient documentation

## 2019-05-02 DIAGNOSIS — Z20822 Contact with and (suspected) exposure to covid-19: Secondary | ICD-10-CM

## 2019-05-03 LAB — NOVEL CORONAVIRUS, NAA: SARS-CoV-2, NAA: DETECTED — AB

## 2019-05-04 ENCOUNTER — Encounter: Payer: Self-pay | Admitting: Nurse Practitioner

## 2019-05-04 ENCOUNTER — Telehealth (HOSPITAL_COMMUNITY): Payer: Self-pay | Admitting: Nurse Practitioner

## 2019-05-04 ENCOUNTER — Other Ambulatory Visit (HOSPITAL_COMMUNITY): Payer: Self-pay | Admitting: Nurse Practitioner

## 2019-05-04 DIAGNOSIS — U071 COVID-19: Secondary | ICD-10-CM

## 2019-05-04 MED ORDER — SODIUM CHLORIDE 0.9 % IV SOLN
700.0000 mg | Freq: Once | INTRAVENOUS | Status: AC
  Administered 2019-05-05: 700 mg via INTRAVENOUS
  Filled 2019-05-04: qty 20

## 2019-05-04 NOTE — Telephone Encounter (Signed)
Called to Discuss with patient about Covid symptoms and the use of bamlanivimab, a monoclonal antibody infusion for those with mild to moderate Covid symptoms and at a high risk of hospitalization.     Pt is qualified for this infusion at the [facility] infusion center due to co-morbid conditions and/or a member of an at-risk group.     Symptoms started 05/01/19. Suffering myalgias, congestion.   Consuello Masse, DNP, AGNP-C [phone] (Infusion Center Hotline)

## 2019-05-04 NOTE — Progress Notes (Signed)
I connected by phone with Jesse Rodgers on 05/04/2019 at 10:43 AM to discuss the potential use of an new treatment for mild to moderate COVID-19 viral infection in non-hospitalized patients.  This patient is a 44 y.o. male that meets the FDA criteria for Emergency Use Authorization of bamlanivimab or casirivimab\imdevimab.  Has a (+) direct SARS-CoV-2 viral test result  Has mild or moderate COVID-19   Is ? 46 years of age and weighs ? 40 kg  Is NOT hospitalized due to COVID-19  Is NOT requiring oxygen therapy or requiring an increase in baseline oxygen flow rate due to COVID-19  Is within 10 days of symptom onset  Has at least one of the high risk factor(s) for progression to severe COVID-19 and/or hospitalization as defined in EUA.  Specific high risk criteria : Diabetes   I have spoken and communicated the following to the patient or parent/caregiver:  1. FDA has authorized the emergency use of bamlanivimab and casirivimab\imdevimab for the treatment of mild to moderate COVID-19 in adults and pediatric patients with positive results of direct SARS-CoV-2 viral testing who are 44 years of age and older weighing at least 40 kg, and who are at high risk for progressing to severe COVID-19 and/or hospitalization.  2. The significant known and potential risks and benefits of bamlanivimab and casirivimab\imdevimab, and the extent to which such potential risks and benefits are unknown.  3. Information on available alternative treatments and the risks and benefits of those alternatives, including clinical trials.  4. Patients treated with bamlanivimab and casirivimab\imdevimab should continue to self-isolate and use infection control measures (e.g., wear mask, isolate, social distance, avoid sharing personal items, clean and disinfect "high touch" surfaces, and frequent handwashing) according to CDC guidelines.   5. The patient or parent/caregiver has the option to accept or refuse  bamlanivimab or casirivimab\imdevimab .  After reviewing this information with the patient, The patient agreed to proceed with receiving the bamlanimivab infusion and will be provided a copy of the Fact sheet prior to receiving the infusion.Consuello Masse, DNP, AGNP-C [phone] (Infusion Center Hotline)

## 2019-05-05 ENCOUNTER — Ambulatory Visit (HOSPITAL_COMMUNITY)
Admission: RE | Admit: 2019-05-05 | Discharge: 2019-05-05 | Disposition: A | Discharge status: Home / Self Care | Payer: HRSA Program | Source: Ambulatory Visit | Attending: Pulmonary Disease | Admitting: Pulmonary Disease

## 2019-05-05 ENCOUNTER — Encounter (HOSPITAL_COMMUNITY): Payer: Self-pay

## 2019-05-05 DIAGNOSIS — U071 COVID-19: Secondary | ICD-10-CM | POA: Diagnosis not present

## 2019-05-05 MED ORDER — FAMOTIDINE IN NACL 20-0.9 MG/50ML-% IV SOLN: 20.0000 mg | Freq: Once | INTRAVENOUS | Status: DC | PRN

## 2019-05-05 MED ORDER — ALBUTEROL SULFATE HFA 108 (90 BASE) MCG/ACT IN AERS: 2.0000 | INHALATION_SPRAY | Freq: Once | RESPIRATORY_TRACT | Status: DC | PRN

## 2019-05-05 MED ORDER — SODIUM CHLORIDE 0.9 % IV SOLN: INTRAVENOUS | Status: DC | PRN

## 2019-05-05 MED ORDER — EPINEPHRINE 0.3 MG/0.3ML IJ SOAJ: 0.3000 mg | Freq: Once | INTRAMUSCULAR | Status: DC | PRN

## 2019-05-05 MED ORDER — DIPHENHYDRAMINE HCL 50 MG/ML IJ SOLN: 50.0000 mg | Freq: Once | INTRAMUSCULAR | Status: DC | PRN

## 2019-05-05 MED ORDER — METHYLPREDNISOLONE SODIUM SUCC 125 MG IJ SOLR: 125.0000 mg | Freq: Once | INTRAMUSCULAR | Status: DC | PRN

## 2019-05-05 NOTE — Progress Notes (Signed)
  Diagnosis: COVID-19  Physician: Dr. P. Wright  Procedure: Covid Infusion Clinic Med: bamlanivimab infusion - Provided patient with bamlanimivab fact sheet for patients, parents and caregivers prior to infusion.  Complications: No immediate complications noted.  Discharge: Discharged home   Jesse Rodgers 05/05/2019  

## 2019-05-05 NOTE — Discharge Instructions (Signed)

## 2019-05-15 DIAGNOSIS — E782 Mixed hyperlipidemia: Secondary | ICD-10-CM | POA: Diagnosis not present

## 2019-05-15 DIAGNOSIS — F329 Major depressive disorder, single episode, unspecified: Secondary | ICD-10-CM | POA: Diagnosis not present

## 2019-05-15 DIAGNOSIS — I10 Essential (primary) hypertension: Secondary | ICD-10-CM | POA: Diagnosis not present

## 2019-05-15 DIAGNOSIS — U071 COVID-19: Secondary | ICD-10-CM | POA: Diagnosis not present

## 2019-06-09 DIAGNOSIS — L600 Ingrowing nail: Secondary | ICD-10-CM | POA: Diagnosis not present

## 2019-06-09 DIAGNOSIS — M79675 Pain in left toe(s): Secondary | ICD-10-CM | POA: Diagnosis not present

## 2019-06-15 ENCOUNTER — Ambulatory Visit: Payer: BC Managed Care – PPO | Admitting: "Endocrinology

## 2019-06-23 DIAGNOSIS — I872 Venous insufficiency (chronic) (peripheral): Secondary | ICD-10-CM | POA: Diagnosis not present

## 2019-06-23 DIAGNOSIS — L600 Ingrowing nail: Secondary | ICD-10-CM | POA: Diagnosis not present

## 2019-07-10 ENCOUNTER — Ambulatory Visit: Payer: BC Managed Care – PPO | Admitting: "Endocrinology

## 2019-07-27 ENCOUNTER — Telehealth: Payer: Self-pay | Admitting: "Endocrinology

## 2019-07-27 NOTE — Telephone Encounter (Signed)
Pt is asking for a refill on [address], [address] and [address]. Patient no showed last 2 appts. He did reschedule for June 21st, next available. He was made aware of no show policy today. He uses [facility] on [address].

## 2019-07-28 ENCOUNTER — Other Ambulatory Visit: Payer: Self-pay | Admitting: "Endocrinology

## 2019-07-28 MED ORDER — NOVOLOG FLEXPEN 100 UNIT/ML ~~LOC~~ SOPN: PEN_INJECTOR | SUBCUTANEOUS | 0 refills | Status: DC

## 2019-07-28 MED ORDER — TRESIBA FLEXTOUCH 100 UNIT/ML ~~LOC~~ SOPN: 34.0000 [IU] | PEN_INJECTOR | Freq: Every day | SUBCUTANEOUS | 0 refills | Status: DC

## 2019-07-28 MED ORDER — FREESTYLE LIBRE 14 DAY SENSOR MISC: 1 refills | Status: DC

## 2019-07-28 NOTE — Telephone Encounter (Signed)
I refilled those for him. Must keep appt.

## 2019-08-21 ENCOUNTER — Ambulatory Visit (INDEPENDENT_AMBULATORY_CARE_PROVIDER_SITE_OTHER): Payer: BC Managed Care – PPO | Admitting: "Endocrinology

## 2019-08-21 ENCOUNTER — Other Ambulatory Visit: Payer: Self-pay

## 2019-08-21 ENCOUNTER — Encounter: Payer: Self-pay | Admitting: "Endocrinology

## 2019-08-21 VITALS — BP 117/76 | HR 98 | Ht 75.0 in | Wt 214.4 lb

## 2019-08-21 DIAGNOSIS — I10 Essential (primary) hypertension: Secondary | ICD-10-CM

## 2019-08-21 DIAGNOSIS — E1165 Type 2 diabetes mellitus with hyperglycemia: Secondary | ICD-10-CM | POA: Diagnosis not present

## 2019-08-21 DIAGNOSIS — E1065 Type 1 diabetes mellitus with hyperglycemia: Secondary | ICD-10-CM

## 2019-08-21 DIAGNOSIS — E782 Mixed hyperlipidemia: Secondary | ICD-10-CM

## 2019-08-21 LAB — POCT GLYCOSYLATED HEMOGLOBIN (HGB A1C): Hemoglobin A1C: 8 % — AB (ref 4.0–5.6)

## 2019-08-21 MED ORDER — NOVOLOG FLEXPEN 100 UNIT/ML ~~LOC~~ SOPN: 8.0000 [IU] | PEN_INJECTOR | Freq: Three times a day (TID) | SUBCUTANEOUS | 2 refills | Status: DC

## 2019-08-21 MED ORDER — TRESIBA FLEXTOUCH 100 UNIT/ML ~~LOC~~ SOPN: 30.0000 [IU] | PEN_INJECTOR | Freq: Every day | SUBCUTANEOUS | 2 refills | Status: DC

## 2019-08-21 NOTE — Progress Notes (Signed)
08/21/2019, 6:36 PM             Endocrinology follow-up note   Subjective:    Patient ID: Jesse Rodgers, male    DOB: 10/04/1975.  Jesse Rodgers is being seen in follow-up for management of currently uncontrolled symptomatic diabetes requested by  Avis Epley, PA-C.   Past Medical History:  Diagnosis Date  . Arthritis    back   . Depression    takes Zoloft daily  . Diabetes mellitus without complication (HCC)    takes Toujeo and Novolog nightly;average fasting blood sugar runs 150  . History of kidney stones   . Hyperlipidemia    takes Simvastatin daily  . Hypertension    takes Losartan daily   Past Surgical History:  Procedure Laterality Date  . ABDOMINAL SURGERY    . APPENDECTOMY    . BACK SURGERY    . DENTAL SURGERY  12/10/2014   molar tooth #  14,15,16  bone biopsy  of ramus  . PARTIAL COLECTOMY    . TOOTH EXTRACTION N/A 12/10/2014   Procedure: EXTRACTION MOLARS #14,15,16  INTRAORAL AND EXTRAORAL INCISION AND DRAINAGE AND BONE BIOPSY OF LEFT RAMUS;  Surgeon: [name], DDS;  Location: [facility] OR;  Service: Oral Surgery;  Laterality: N/A;   Social History   Socioeconomic History  . Marital status: Married    Spouse name: Not on file  . Number of children: Not on file  . Years of education: Not on file  . Highest education level: Not on file  Occupational History  . Not on file  Tobacco Use  . Smoking status: Former Smoker    Packs/day: 1.00    Years: 15.00    Pack years: 15.00    Quit date: 02/08/2019    Years since quitting: 0.5  . Smokeless tobacco: Never Used  Vaping Use  . Vaping Use: Never used  Substance and Sexual Activity  . Alcohol use: Yes    Comment: occasional  . Drug use: No  . Sexual activity: Yes  Other Topics Concern  . Not on file  Social History Narrative  . Not on file   Social Determinants of Health   Financial Resource  Strain:   . Difficulty of Paying Living Expenses:   Food Insecurity:   . Worried About Programme researcher, broadcasting/film/video in the Last Year:   . Barista in the Last Year:   Transportation Needs:   . Freight forwarder (Medical):   Marland Kitchen Lack of Transportation (Non-Medical):   Physical Activity:   . Days of Exercise per Week:   . Minutes of Exercise per Session:   Stress:   . Feeling of Stress :   Social Connections:   . Frequency of Communication with Friends and Family:   . Frequency of Social Gatherings with Friends and Family:   . Attends Religious Services:   . Active Member of Clubs or Organizations:   . Attends Banker Meetings:   Marland Kitchen Marital Status:    Outpatient Encounter Medications as of 08/21/2019  Medication Sig  . Continuous  Blood Gluc Sensor (FREESTYLE LIBRE 14 DAY SENSOR) MISC REPLACE SENSOR ON THE SKIN EVERY 14 DAYS  . glucagon (GLUCAGON EMERGENCY) 1 MG injection Inject 1 mg into the vein once as needed.  Marland Kitchen GLUCOSAMINE HCL PO Take by mouth daily.  . insulin aspart (NOVOLOG FLEXPEN) 100 UNIT/ML FlexPen Inject 8-11 Units into the skin 3 (three) times daily before meals.  . insulin degludec (TRESIBA FLEXTOUCH) 100 UNIT/ML FlexTouch Pen Inject 0.3 mLs (30 Units total) into the skin at bedtime.  Marland Kitchen losartan (COZAAR) 25 MG tablet Take 25 mg by mouth daily.  . Multiple Vitamin (MULTIVITAMIN WITH MINERALS) TABS tablet Take 1 tablet by mouth daily.  . Omega-3 Fatty Acids (OMEGA 3 PO) Take by mouth daily.  . simvastatin (ZOCOR) 40 MG tablet Take 40 mg by mouth daily.   . [DISCONTINUED] insulin aspart (NOVOLOG FLEXPEN) 100 UNIT/ML FlexPen INJECT 5-11 UNITS INTO THE SKIN 3 (THREE) TIMES DAILY WITH MEALS.  . [DISCONTINUED] insulin degludec (TRESIBA FLEXTOUCH) 100 UNIT/ML FlexTouch Pen Inject 0.34 mLs (34 Units total) into the skin at bedtime.  . furosemide (LASIX) 20 MG tablet Take 20 mg by mouth daily as needed.  . sertraline (ZOLOFT) 100 MG tablet Take 100 mg by mouth daily.    No facility-administered encounter medications on file as of 08/21/2019.    ALLERGIES: No Known Allergies  VACCINATION STATUS: Immunization History  Administered Date(s) Administered  . Influenza-Unspecified 11/23/2014    Diabetes He presents for his follow-up diabetic visit. He has type 1 diabetes mellitus. Onset time: He was diagnosed at approximate age of 8 years. His disease course has been fluctuating. There are no hypoglycemic associated symptoms. Pertinent negatives for hypoglycemia include no confusion, headaches, pallor or seizures. Associated symptoms include polydipsia and polyuria. Pertinent negatives for diabetes include no chest pain, no fatigue, no polyphagia and no weakness. There are no hypoglycemic complications. Symptoms are stable. There are no diabetic complications. Risk factors for coronary artery disease include dyslipidemia, diabetes mellitus, hypertension, family history, male sex, tobacco exposure and sedentary lifestyle. Current diabetic treatment includes insulin injections (He is currently on Lantus 50 units nightly, Humalog sliding scale 3 times a day.). He is compliant with treatment most of the time. His weight is increasing steadily. He is following a generally unhealthy diet. When asked about meal planning, he reported none. He has not had a previous visit with a dietitian. He never participates in exercise. His home blood glucose trend is fluctuating minimally. His breakfast blood glucose range is generally 140-180 mg/dl. His lunch blood glucose range is generally 180-200 mg/dl. His dinner blood glucose range is generally 180-200 mg/dl. His overall blood glucose range is 180-200 mg/dl. (He did not bring his logs.  His CGM analysis shows significantly above target profile, point-of-care A1c is 8%.  He reports 1 to 2% of hypoglycemic episodes mainly in the early morning hours.  ) An ACE inhibitor/angiotensin II receptor blocker is being taken. Eye exam is current.   Hyperlipidemia This is a chronic problem. The current episode started more than 1 year ago. Exacerbating diseases include diabetes. Pertinent negatives include no chest pain, myalgias or shortness of breath. Risk factors for coronary artery disease include diabetes mellitus, dyslipidemia, hypertension, male sex and a sedentary lifestyle.  Hypertension This is a chronic problem. The problem is controlled. Pertinent negatives include no chest pain, headaches, neck pain, palpitations or shortness of breath. Risk factors for coronary artery disease include diabetes mellitus, dyslipidemia, male gender, smoking/tobacco exposure and sedentary lifestyle. Past treatments include ACE  inhibitors.     Review of Systems  Constitutional: Negative for chills, fatigue, fever and unexpected weight change.  HENT: Negative for dental problem, mouth sores and trouble swallowing.   Eyes: Negative for visual disturbance.  Respiratory: Negative for cough, choking, chest tightness, shortness of breath and wheezing.   Cardiovascular: Negative for chest pain, palpitations and leg swelling.  Gastrointestinal: Negative for abdominal distention, abdominal pain, constipation, diarrhea, nausea and vomiting.  Endocrine: Positive for polydipsia and polyuria. Negative for polyphagia.  Genitourinary: Negative for dysuria, flank pain, hematuria and urgency.  Musculoskeletal: Negative for back pain, gait problem, myalgias and neck pain.  Skin: Negative for pallor, rash and wound.  Neurological: Negative for seizures, syncope, weakness, numbness and headaches.  Psychiatric/Behavioral: Negative.  Negative for confusion and dysphoric mood.    Objective:    BP 117/76 (BP Location: Right Arm, Patient Position: Sitting)   Pulse 98   Ht 6\' 3"  (1.905 m)   Wt 214 lb 6.4 oz (97.3 kg)   BMI 26.80 kg/m   Wt Readings from Last 3 Encounters:  08/21/19 214 lb 6.4 oz (97.3 kg)  05/05/18 214 lb 3.2 oz (97.2 kg)  01/06/18 211 lb (95.7  kg)     Recent Results (from the past 2160 hour(s))  HgB A1c     Status: Abnormal   Collection Time: 08/21/19  3:59 PM  Result Value Ref Range   Hemoglobin A1C 8.0 (A) 4.0 - 5.6 %   HbA1c POC (<> result, manual entry)     HbA1c, POC (prediabetic range)     HbA1c, POC (controlled diabetic range)       Assessment & Plan:   1. Uncontrolled type 1 diabetes mellitus with hyperglycemia (HCC)  - Jesse Rodgers has currently uncontrolled symptomatic type 1 DM since 44 years of age.  He denies any history of diabetes ketoacidosis.  He did not bring his logs.  His CGM analysis shows significantly above target profile, point-of-care A1c is 8%.  He reports 1 to 2% of hypoglycemic mainly in the early morning hours.   -his diabetes is complicated by chronic heavy smoking and SHIMSHON NARULA remains at a high risk for more acute and chronic complications which include CAD, CVA, CKD, retinopathy, and neuropathy. These are all discussed in detail with the patient.  - I have counseled him on diet management by adopting a carbohydrate restricted/protein rich diet. - he  admits there is a room for improvement in his diet and drink choices. -  Suggestion is made for him to avoid simple carbohydrates  from his diet including Cakes, Sweet Desserts / Pastries, Ice Cream, Soda (diet and regular), Sweet Tea, Candies, Chips, Cookies, Sweet Pastries,  Store Bought Juices, Alcohol in Excess of  1-2 drinks a day, Artificial Sweeteners, Coffee Creamer, and "Sugar-free" Products. This will help patient to have stable blood glucose profile and potentially avoid unintended weight gain.  - I encouraged him to switch to  unprocessed or minimally processed complex starch and increased protein intake (animal or plant source), fruits, and vegetables.  - he is advised to stick to a routine mealtimes to eat 3 meals  a day and avoid unnecessary snacks ( to snack only to correct hypoglycemia).   - he will be scheduled with  Jearld Fenton, RDN, CDE for individualized diabetes education.  - I have approached him with the following individualized plan to manage diabetes and patient agrees:   -He has clearly benefited from his CGM device, reengaged better.  -He  will continue to require intensive treatment with basal/bolus insulin in order for him to achieve and maintain control of diabetes to target.   -He is advised to lower her Tresiba to 30 units nightly, increase his Humalog to 8 units 3 times a day-for premeal blood glucose readings above 90 mg per DL, plus patient specific correction dose for unexpected hyperglycemia above 150mg /dl, associated with strict monitoring of glucose 4 times a day-before meals and at bedtime.  - Patient is warned not to take insulin without proper monitoring per orders. -Adjustment parameters are given for hypo and hyperglycemia in writing. -Patient is encouraged to call clinic for blood glucose levels less than 70 or above 300 mg /dl. - Patient specific target  A1c;  LDL, HDL, Triglycerides  were discussed in detail.  2) BP/HTN:  Blood pressure is controlled to target.  He is advised to continue his current blood pressure medications including losartan 25 mg p.o. daily.   3) Lipids/HPL: no recent lipid panel to review.  He is advised to continue simvastatin 40 mg p.o. nightly. He will be considered for fasting lipid panel on subsequent visits.  4)  Weight/Diet: His BMI is 26.8-not a candidate for major weight loss.  CDE Consult has been initiated , exercise, and detailed carbohydrates information provided.  5) Chronic Care/Health Maintenance:  -he  is on ARB and Statin medications and  is encouraged to initiate and continue to follow up with Ophthalmology, Dentist,  Podiatrist at least yearly or according to recommendations, and he is advised to stay away from smoking.    I   have recommended yearly flu vaccine and pneumonia vaccine at least every 5 years; moderate intensity  exercise for up to 150 minutes weekly; and  sleep for at least 7 hours a day.  - I advised patient to maintain close follow up with , PA-C for primary care needs.  - Time spent on this patient care encounter:  35 min, of which > 50% was spent in  counseling and the rest reviewing his blood glucose logs , discussing his hypoglycemia and hyperglycemia episodes, reviewing his current and  previous labs / studies  ( including abstraction from other facilities) and medications  doses and developing a  long term treatment plan and documenting his care.   Please refer to Patient Instructions for Blood Glucose Monitoring and Insulin/Medications Dosing Guide"  in media tab for additional information. Please  also refer to " Patient Self Inventory" in the Media  tab for reviewed elements of pertinent patient history.  Avis Epley participated in the discussions, expressed understanding, and voiced agreement with the above plans.  All questions were answered to his satisfaction. he is encouraged to contact clinic should he have any questions or concerns prior to his return visit.   Follow up plan: - Return in about 4 months (around 12/21/2019) for F/U with Pre-visit Labs, Meter, Logs, A1c here.12/23/2019, MD [facility] [address] [place], [place] Phone: [phone]  Fax: [phone]    08/21/2019, 6:36 PM  This note was partially dictated with voice recognition software. Similar sounding words can be transcribed inadequately or may not  be corrected upon review.

## 2019-09-10 ENCOUNTER — Other Ambulatory Visit: Payer: Self-pay | Admitting: "Endocrinology

## 2019-10-03 ENCOUNTER — Other Ambulatory Visit: Payer: Self-pay | Admitting: "Endocrinology

## 2019-10-31 DIAGNOSIS — E663 Overweight: Secondary | ICD-10-CM | POA: Diagnosis not present

## 2019-10-31 DIAGNOSIS — K625 Hemorrhage of anus and rectum: Secondary | ICD-10-CM | POA: Diagnosis not present

## 2019-10-31 DIAGNOSIS — Z6826 Body mass index (BMI) 26.0-26.9, adult: Secondary | ICD-10-CM | POA: Diagnosis not present

## 2019-11-08 ENCOUNTER — Other Ambulatory Visit: Payer: Self-pay | Admitting: "Endocrinology

## 2019-11-21 LAB — HM DIABETES EYE EXAM

## 2019-12-10 ENCOUNTER — Other Ambulatory Visit: Payer: Self-pay | Admitting: "Endocrinology

## 2019-12-21 ENCOUNTER — Ambulatory Visit: Payer: BC Managed Care – PPO | Admitting: Nurse Practitioner

## 2019-12-22 DIAGNOSIS — I872 Venous insufficiency (chronic) (peripheral): Secondary | ICD-10-CM | POA: Diagnosis not present

## 2019-12-22 DIAGNOSIS — E1142 Type 2 diabetes mellitus with diabetic polyneuropathy: Secondary | ICD-10-CM | POA: Diagnosis not present

## 2020-01-11 ENCOUNTER — Other Ambulatory Visit: Payer: Self-pay | Admitting: "Endocrinology

## 2020-02-06 DIAGNOSIS — E1065 Type 1 diabetes mellitus with hyperglycemia: Secondary | ICD-10-CM | POA: Diagnosis not present

## 2020-02-07 LAB — COMPLETE METABOLIC PANEL WITH GFR
AG Ratio: 1.8 (calc) (ref 1.0–2.5)
ALT: 22 U/L (ref 9–46)
AST: 20 U/L (ref 10–40)
Albumin: 4.5 g/dL (ref 3.6–5.1)
Alkaline phosphatase (APISO): 95 U/L (ref 36–130)
BUN: 21 mg/dL (ref 7–25)
CO2: 25 mmol/L (ref 20–32)
Calcium: 9.3 mg/dL (ref 8.6–10.3)
Chloride: 100 mmol/L (ref 98–110)
Creat: 0.9 mg/dL (ref 0.60–1.35)
GFR, Est African American: 118 mL/min/{1.73_m2} (ref >=60)
GFR, Est Non African American: 102 mL/min/{1.73_m2} (ref >=60)
Globulin: 2.5 g/dL (calc) (ref 1.9–3.7)
Glucose, Bld: 229 mg/dL — ABNORMAL HIGH (ref 65–99)
Potassium: 4.8 mmol/L (ref 3.5–5.3)
Sodium: 139 mmol/L (ref 135–146)
Total Bilirubin: 0.4 mg/dL (ref 0.2–1.2)
Total Protein: 7 g/dL (ref 6.1–8.1)

## 2020-02-07 LAB — TSH: TSH: 2.83 mIU/L (ref 0.40–4.50)

## 2020-02-07 LAB — LIPID PANEL
Cholesterol: 238 mg/dL — ABNORMAL HIGH (ref <200)
HDL: 52 mg/dL (ref >=40)
LDL Cholesterol (Calc): 131 mg/dL (calc) — ABNORMAL HIGH
Non-HDL Cholesterol (Calc): 186 mg/dL (calc) — ABNORMAL HIGH (ref <130)
Total CHOL/HDL Ratio: 4.6 (calc) (ref <5.0)
Triglycerides: 385 mg/dL — ABNORMAL HIGH (ref <150)

## 2020-02-07 LAB — T4, FREE: Free T4: 1.1 ng/dL (ref 0.8–1.8)

## 2020-02-17 ENCOUNTER — Other Ambulatory Visit: Payer: Self-pay | Admitting: "Endocrinology

## 2020-03-06 DIAGNOSIS — B349 Viral infection, unspecified: Secondary | ICD-10-CM | POA: Diagnosis not present

## 2020-03-19 DIAGNOSIS — L600 Ingrowing nail: Secondary | ICD-10-CM | POA: Diagnosis not present

## 2020-03-19 DIAGNOSIS — L03031 Cellulitis of right toe: Secondary | ICD-10-CM | POA: Diagnosis not present

## 2020-03-26 DIAGNOSIS — Z4889 Encounter for other specified surgical aftercare: Secondary | ICD-10-CM | POA: Diagnosis not present

## 2020-04-09 ENCOUNTER — Encounter: Payer: Self-pay | Admitting: Nurse Practitioner

## 2020-04-09 ENCOUNTER — Ambulatory Visit (INDEPENDENT_AMBULATORY_CARE_PROVIDER_SITE_OTHER): Payer: BC Managed Care – PPO | Admitting: Nurse Practitioner

## 2020-04-09 ENCOUNTER — Other Ambulatory Visit: Payer: Self-pay

## 2020-04-09 VITALS — BP 147/84 | HR 91 | Ht 75.0 in | Wt 220.8 lb

## 2020-04-09 DIAGNOSIS — E1165 Type 2 diabetes mellitus with hyperglycemia: Secondary | ICD-10-CM | POA: Diagnosis not present

## 2020-04-09 DIAGNOSIS — E1065 Type 1 diabetes mellitus with hyperglycemia: Secondary | ICD-10-CM | POA: Diagnosis not present

## 2020-04-09 LAB — POCT UA - MICROALBUMIN
Albumin/Creatinine Ratio, Urine, POC: <30
Creatinine, POC: 300 mg/dL
Microalbumin Ur, POC: 30 mg/L

## 2020-04-09 LAB — POCT GLYCOSYLATED HEMOGLOBIN (HGB A1C): HbA1c, POC (controlled diabetic range): 7.9 % — AB (ref 0.0–7.0)

## 2020-04-09 MED ORDER — FREESTYLE TEST VI STRP: ORAL_STRIP | 12 refills | Status: DC

## 2020-04-09 MED ORDER — TRESIBA FLEXTOUCH 100 UNIT/ML ~~LOC~~ SOPN: 38.0000 [IU] | PEN_INJECTOR | Freq: Every day | SUBCUTANEOUS | Status: DC

## 2020-04-09 MED ORDER — FREESTYLE LIBRE 14 DAY SENSOR MISC: 6 refills | Status: DC

## 2020-04-09 NOTE — Patient Instructions (Signed)

## 2020-04-09 NOTE — Progress Notes (Signed)
04/09/2020, 4:13 PM             Endocrinology follow-up note   Subjective:    Patient ID: Jesse Rodgers, male    DOB: May 09, 1975.  Jesse Rodgers is being seen in follow-up for management of currently uncontrolled symptomatic diabetes requested by  Shawnie Dapper, PA-C.   Past Medical History:  Diagnosis Date  . Arthritis    back   . Depression    takes Zoloft daily  . Diabetes mellitus without complication (HCC)    takes Toujeo and Novolog nightly;average fasting blood sugar runs 150  . History of kidney stones   . Hyperlipidemia    takes Simvastatin daily  . Hypertension    takes Losartan daily   Past Surgical History:  Procedure Laterality Date  . ABDOMINAL SURGERY    . APPENDECTOMY    . BACK SURGERY    . DENTAL SURGERY  12/10/2014   molar tooth #  14,15,16  bone biopsy  of ramus  . PARTIAL COLECTOMY    . TOOTH EXTRACTION N/A 12/10/2014   Procedure: EXTRACTION MOLARS #14,15,16  INTRAORAL AND EXTRAORAL INCISION AND DRAINAGE AND BONE BIOPSY OF LEFT RAMUS;  Surgeon: [name], DDS;  Location: [facility] OR;  Service: Oral Surgery;  Laterality: N/A;   Social History   Socioeconomic History  . Marital status: Married    Spouse name: Not on file  . Number of children: Not on file  . Years of education: Not on file  . Highest education level: Not on file  Occupational History  . Not on file  Tobacco Use  . Smoking status: Former Smoker    Packs/day: 1.00    Years: 15.00    Pack years: 15.00    Quit date: 02/08/2019    Years since quitting: 1.1  . Smokeless tobacco: Never Used  Vaping Use  . Vaping Use: Never used  Substance and Sexual Activity  . Alcohol use: Yes    Comment: occasional  . Drug use: No  . Sexual activity: Yes  Other Topics Concern  . Not on file  Social History Narrative  . Not on file   Social Determinants of Health   Financial Resource Strain:  Not on file  Food Insecurity: Not on file  Transportation Needs: Not on file  Physical Activity: Not on file  Stress: Not on file  Social Connections: Not on file   Outpatient Encounter Medications as of 04/09/2020  Medication Sig  . Continuous Blood Gluc Sensor (FREESTYLE LIBRE 14 DAY SENSOR) MISC REPLACE SENSOR ON THE SKIN EVERY 14 DAYS  . Continuous Blood Gluc Sensor (FREESTYLE LIBRE 14 DAY SENSOR) MISC Apply new sensor every 14 days  . furosemide (LASIX) 20 MG tablet Take 20 mg by mouth daily as needed.  Marland Kitchen glucagon (GLUCAGON EMERGENCY) 1 MG injection Inject 1 mg into the vein once as needed.  Marland Kitchen GLUCOSAMINE HCL PO Take by mouth daily.  . insulin aspart (NOVOLOG FLEXPEN) 100 UNIT/ML FlexPen Inject 8-11 Units into the skin 3 (three) times daily before meals.  . insulin degludec (TRESIBA FLEXTOUCH) 100 UNIT/ML FlexTouch  Pen Inject 38 Units into the skin at bedtime.  Marland Kitchen losartan (COZAAR) 25 MG tablet Take 25 mg by mouth daily.  . Multiple Vitamin (MULTIVITAMIN WITH MINERALS) TABS tablet Take 1 tablet by mouth daily.  . Omega-3 Fatty Acids (OMEGA 3 PO) Take by mouth daily.  . sertraline (ZOLOFT) 100 MG tablet Take 100 mg by mouth daily.  . simvastatin (ZOCOR) 40 MG tablet Take 40 mg by mouth daily.   . [DISCONTINUED] Continuous Blood Gluc Sensor (FREESTYLE LIBRE 14 DAY SENSOR) MISC REPLACE SENSOR ON THE SKIN EVERY 14 DAYS  . [DISCONTINUED] TRESIBA FLEXTOUCH 100 UNIT/ML FlexTouch Pen INJECT 30 UNITS INTO THE SKIN EVERY NIGHT AT BEDTIME (Patient taking differently: Inject 38 Units into the skin at bedtime.)   No facility-administered encounter medications on file as of 04/09/2020.    ALLERGIES: No Known Allergies  VACCINATION STATUS: Immunization History  Administered Date(s) Administered  . Influenza-Unspecified 11/23/2014    Diabetes He presents for his follow-up diabetic visit. He has type 1 diabetes mellitus. Onset time: He was diagnosed at approximate age of 8 years. His disease  course has been fluctuating. There are no hypoglycemic associated symptoms. Pertinent negatives for hypoglycemia include no confusion, headaches, pallor or seizures. Pertinent negatives for diabetes include no chest pain, no fatigue, no polydipsia, no polyphagia, no polyuria and no weakness. There are no hypoglycemic complications. Symptoms are stable. There are no diabetic complications. Risk factors for coronary artery disease include dyslipidemia, diabetes mellitus, hypertension, family history, male sex, tobacco exposure and sedentary lifestyle. Current diabetic treatment includes intensive insulin program. He is compliant with treatment most of the time. His weight is increasing steadily. He is following a generally unhealthy diet. When asked about meal planning, he reported none. He has not had a previous visit with a dietitian. He never participates in exercise. His home blood glucose trend is fluctuating minimally. (He presents today with no meter or logs to review.  He has a CGM but ran out of sensors about 2 months ago.  He says he has been monitoring his glucose routinely using his back up meter but did not bring in recent readings (he brought in readings from October).  He denies any episodes of hypoglycemia.  He says he has been eating more than he should lately.  His POCT A1c today is 7.9%, essentially unchanged from previous visit of 8%. ) An ACE inhibitor/angiotensin II receptor blocker is being taken. He sees a podiatrist.Eye exam is current.  Hyperlipidemia This is a chronic problem. The current episode started more than 1 year ago. The problem is uncontrolled. Recent lipid tests were reviewed and are high. Exacerbating diseases include diabetes and obesity. Factors aggravating his hyperlipidemia include fatty foods. Pertinent negatives include no chest pain, myalgias or shortness of breath. Current antihyperlipidemic treatment includes statins. The current treatment provides mild improvement of  lipids. Compliance problems include adherence to diet and adherence to exercise.  Risk factors for coronary artery disease include diabetes mellitus, dyslipidemia, hypertension, male sex, a sedentary lifestyle and obesity.  Hypertension This is a chronic problem. The current episode started more than 1 year ago. The problem has been waxing and waning since onset. The problem is uncontrolled. Pertinent negatives include no chest pain, headaches, neck pain, palpitations or shortness of breath. There are no associated agents to hypertension. Risk factors for coronary artery disease include diabetes mellitus, dyslipidemia, male gender, smoking/tobacco exposure, sedentary lifestyle and obesity. Past treatments include ACE inhibitors and diuretics. The current treatment provides mild improvement. Compliance  problems include exercise and diet.     Review of systems  Constitutional: + steadily increasing body weight,  current Body mass index is 27.6 kg/m. , no fatigue, no subjective hyperthermia, no subjective hypothermia Eyes: no blurry vision, no xerophthalmia ENT: no sore throat, no nodules palpated in throat, no dysphagia/odynophagia, no hoarseness Cardiovascular: no chest pain, no shortness of breath, no palpitations, no leg swelling Respiratory: no cough, no shortness of breath Gastrointestinal: no nausea/vomiting/diarrhea Musculoskeletal: no muscle/joint aches Skin: no rashes, no hyperemia Neurological: no tremors, no numbness, no tingling, no dizziness Psychiatric: no depression, no anxiety   Objective:    BP (!) 147/84   Pulse 91   Ht 6\' 3"  (1.905 m)   Wt 220 lb 12.8 oz (100.2 kg)   BMI 27.60 kg/m   Wt Readings from Last 3 Encounters:  04/09/20 220 lb 12.8 oz (100.2 kg)  08/21/19 214 lb 6.4 oz (97.3 kg)  05/05/18 214 lb 3.2 oz (97.2 kg)    BP Readings from Last 3 Encounters:  04/09/20 (!) 147/84  08/21/19 117/76  05/05/19 131/75     Physical Exam- Limited  Constitutional:   Body mass index is 27.6 kg/m. , not in acute distress, normal state of mind Eyes:  EOMI, no exophthalmos Neck: Supple Cardiovascular: RRR, no murmers, rubs, or gallops, no edema Respiratory: Adequate breathing efforts, no crackles, rales, rhonchi, or wheezing Musculoskeletal: no gross deformities, strength intact in all four extremities, no gross restriction of joint movements Skin:  no rashes, no hyperemia Neurological: no tremor with outstretched hands    POCT ABI Results 04/09/20   Right ABI:  1.09      Left ABI:  1.07  Right leg systolic / diastolic: 160/90 mmHg Left leg systolic / diastolic: 158/81 mmHg  Arm systolic / diastolic: 147/84 mmHG  Detailed report will be scanned into patient chart.    Recent Results (from the past 2160 hour(s))  COMPLETE METABOLIC PANEL WITH GFR     Status: Abnormal   Collection Time: 02/06/20  9:34 AM  Result Value Ref Range   Glucose, Bld 229 (H) 65 - 99 mg/dL    Comment: .            Fasting reference interval . For someone without known diabetes, a glucose value >125 mg/dL indicates that they may have diabetes and this should be confirmed with a follow-up test. .    BUN 21 7 - 25 mg/dL   Creat 14/07/21 8.18 - 2.99 mg/dL   GFR, Est Non African American 102 > OR = 60 mL/min/1.20m2   GFR, Est African American 118 > OR = 60 mL/min/1.28m2   BUN/Creatinine Ratio NOT APPLICABLE 6 - 22 (calc)   Sodium 139 135 - 146 mmol/L   Potassium 4.8 3.5 - 5.3 mmol/L   Chloride 100 98 - 110 mmol/L   CO2 25 20 - 32 mmol/L   Calcium 9.3 8.6 - 10.3 mg/dL   Total Protein 7.0 6.1 - 8.1 g/dL   Albumin 4.5 3.6 - 5.1 g/dL   Globulin 2.5 1.9 - 3.7 g/dL (calc)   AG Ratio 1.8 1.0 - 2.5 (calc)   Total Bilirubin 0.4 0.2 - 1.2 mg/dL   Alkaline phosphatase (APISO) 95 36 - 130 U/L   AST 20 10 - 40 U/L   ALT 22 9 - 46 U/L  Lipid panel     Status: Abnormal   Collection Time: 02/06/20  9:34 AM  Result Value Ref Range   Cholesterol 238 (H) <200  mg/dL   HDL 52 >  OR = 40 mg/dL   Triglycerides 202 (H) <150 mg/dL    Comment: . If a non-fasting specimen was collected, consider repeat triglyceride testing on a fasting specimen if clinically indicated.  Perry Mount et al. J. of Clin. Lipidol. 2015;9:129-169. Marland Kitchen    LDL Cholesterol (Calc) 131 (H) mg/dL (calc)    Comment: Reference range: <100 . Desirable range <100 mg/dL for primary prevention;   <70 mg/dL for patients with CHD or diabetic patients  with > or = 2 CHD risk factors. Marland Kitchen LDL-C is now calculated using the Martin-Hopkins  calculation, which is a validated novel method providing  better accuracy than the Friedewald equation in the  estimation of LDL-C.  Horald Pollen et al. Lenox Ahr. 5427;062(37): 2061-2068  (http://education.[facility].com/faq/FAQ164)    Total CHOL/HDL Ratio 4.6 <5.0 (calc)   Non-HDL Cholesterol (Calc) 186 (H) <130 mg/dL (calc)    Comment: For patients with diabetes plus 1 major ASCVD risk  factor, treating to a non-HDL-C goal of <100 mg/dL  (LDL-C of <62 mg/dL) is considered a therapeutic  option.   TSH     Status: None   Collection Time: 02/06/20  9:34 AM  Result Value Ref Range   TSH 2.83 0.40 - 4.50 mIU/L  T4, free     Status: None   Collection Time: 02/06/20  9:34 AM  Result Value Ref Range   Free T4 1.1 0.8 - 1.8 ng/dL  HgB G3T     Status: Abnormal   Collection Time: 04/09/20  3:52 PM  Result Value Ref Range   Hemoglobin A1C     HbA1c POC (<> result, manual entry)     HbA1c, POC (prediabetic range)     HbA1c, POC (controlled diabetic range) 7.9 (A) 0.0 - 7.0 %  POCT UA - Microalbumin     Status: None   Collection Time: 04/09/20  3:53 PM  Result Value Ref Range   Microalbumin Ur, POC 30 mg/L   Creatinine, POC 300 mg/dL   Albumin/Creatinine Ratio, Urine, POC <30      Assessment & Plan:   1) Uncontrolled type 1 diabetes mellitus with hyperglycemia (HCC)  - Jesse Rodgers has currently uncontrolled symptomatic type 1 DM since 45 years of age.  He  denies any history of diabetes ketoacidosis.  He presents today with no meter or logs to review.  He has a CGM but ran out of sensors about 2 months ago.  He says he has been monitoring his glucose routinely using his back up meter but did not bring in recent readings (he brought in readings from October).  He denies any episodes of hypoglycemia.  He says he has been eating more than he should lately.  His POCT A1c today is 7.9%, essentially unchanged from previous visit of 8%.  -his diabetes is complicated by chronic heavy smoking and Jesse Rodgers remains at a high risk for more acute and chronic complications which include CAD, CVA, CKD, retinopathy, and neuropathy. These are all discussed in detail with the patient.  - Nutritional counseling repeated at each appointment due to patients tendency to fall back in to old habits.  - The patient admits there is a room for improvement in their diet and drink choices. -  Suggestion is made for the patient to avoid simple carbohydrates from their diet including Cakes, Sweet Desserts / Pastries, Ice Cream, Soda (diet and regular), Sweet Tea, Candies, Chips, Cookies, Sweet Pastries,  Store Bought Juices, Alcohol in  Excess of  1-2 drinks a day, Artificial Sweeteners, Coffee Creamer, and "Sugar-free" Products. This will help patient to have stable blood glucose profile and potentially avoid unintended weight gain.   - I encouraged the patient to switch to  unprocessed or minimally processed complex starch and increased protein intake (animal or plant source), fruits, and vegetables.   - Patient is advised to stick to a routine mealtimes to eat 3 meals  a day and avoid unnecessary snacks ( to snack only to correct hypoglycemia).  - I have approached him with the following individualized plan to manage diabetes and patient agrees:   -He has clearly benefited from his CGM device, however, since his last visit he has run out of sensors and did not get them  refilled.  He does monitor his glucose with a back up method routinely.  -He will continue to require intensive treatment with basal/bolus insulin in order for him to achieve and maintain control of diabetes to target.   -Given the lack of meter or logs to review, he is advised to continue his current dose of Tresiba at 38 units SQ nightly and continue Novolog 8-11 units TID with meals if glucose is above 90 and he is eating.  Specific instructions on how to titrate insulin dose based on glucose readings given to patient in writing.   -He is advised to restart monitoring blood glucose 4 times daily, before meals and before bedtime using his CGM (refill sent in), and to call the clinic if he has readings less than 70 or greater than 300 for 3 tests in a row.  - Patient is warned not to take insulin without proper monitoring per orders. -Adjustment parameters are given for hypo and hyperglycemia in writing.  - Patient specific target  A1c;  LDL, HDL, Triglycerides  were discussed in detail.  2) BP/HTN:  Blood pressure is not controlled to target according to today's reading.  He is advised to continue his current blood pressure medications including Losartan 25 mg p.o. daily and Lasix 20 mg po daily.  3) Lipids/HPL:  His most recent lipid panel from 02/06/20 shows uncontrolled LDL of 131 and elevated triglycerides of 385.  He is advised to continue Simvastatin 40 mg po daily at bedtime.  Side effects and precautions discussed with him.  Also advised him to avoid fried foods and butter.  4)  Weight/Diet: His Body mass index is 27.6 kg/m.-not a candidate for major weight loss.  CDE Consult has been initiated , exercise, and detailed carbohydrates information provided.  5) Chronic Care/Health Maintenance: -he is on ARB and Statin medications and is encouraged to initiate and continue to follow up with Ophthalmology, Dentist,  Podiatrist at least yearly or according to recommendations, and he is  advised to stay away from smoking.  I have recommended yearly flu vaccine and pneumonia vaccine at least every 5 years; moderate intensity exercise for up to 150 minutes weekly; and sleep for at least 7 hours a day.  - I advised patient to maintain close follow up with Shawnie DapperMann, Benjamin L, PA-C for primary care needs.   - Time spent on this patient care encounter:  35 min, of which > 50% was spent in  counseling and the rest reviewing his blood glucose logs , discussing his hypoglycemia and hyperglycemia episodes, reviewing his current and  previous labs / studies  ( including abstraction from other facilities) and medications  doses and developing a  long term treatment plan and documenting his  care.   Please refer to Patient Instructions for Blood Glucose Monitoring and Insulin/Medications Dosing Guide"  in media tab for additional information. Please  also refer to " Patient Self Inventory" in the Media  tab for reviewed elements of pertinent patient history.  Jesse Rodgers participated in the discussions, expressed understanding, and voiced agreement with the above plans.  All questions were answered to his satisfaction. he is encouraged to contact clinic should he have any questions or concerns prior to his return visit.    Follow up plan: - Return in about 3 months (around 07/07/2020) for Diabetes follow up with A1c in office, No previsit labs, Bring glucometer and logs.  Ronny Bacon, [facility] [address] [place], [place] [address] Phone: [phone] Fax: [phone]  04/09/2020, 4:13 PM

## 2020-05-01 ENCOUNTER — Other Ambulatory Visit: Payer: Self-pay

## 2020-05-01 ENCOUNTER — Other Ambulatory Visit: Payer: Self-pay | Admitting: "Endocrinology

## 2020-05-01 DIAGNOSIS — E1065 Type 1 diabetes mellitus with hyperglycemia: Secondary | ICD-10-CM

## 2020-05-01 MED ORDER — INSULIN ASPART FLEXPEN 100 UNIT/ML ~~LOC~~ SOPN: PEN_INJECTOR | SUBCUTANEOUS | 1 refills | Status: DC

## 2020-05-09 ENCOUNTER — Other Ambulatory Visit: Payer: Self-pay | Admitting: "Endocrinology

## 2020-05-09 DIAGNOSIS — E1065 Type 1 diabetes mellitus with hyperglycemia: Secondary | ICD-10-CM

## 2020-05-14 ENCOUNTER — Other Ambulatory Visit: Payer: Self-pay | Admitting: Nurse Practitioner

## 2020-06-21 ENCOUNTER — Other Ambulatory Visit: Payer: Self-pay | Admitting: Nurse Practitioner

## 2020-06-21 DIAGNOSIS — E1065 Type 1 diabetes mellitus with hyperglycemia: Secondary | ICD-10-CM

## 2020-07-11 ENCOUNTER — Encounter: Payer: Self-pay | Admitting: Nurse Practitioner

## 2020-07-11 ENCOUNTER — Other Ambulatory Visit: Payer: Self-pay

## 2020-07-11 ENCOUNTER — Ambulatory Visit (INDEPENDENT_AMBULATORY_CARE_PROVIDER_SITE_OTHER): Payer: BC Managed Care – PPO | Admitting: Nurse Practitioner

## 2020-07-11 VITALS — BP 141/82 | HR 85 | Ht 75.0 in | Wt 218.0 lb

## 2020-07-11 DIAGNOSIS — E1065 Type 1 diabetes mellitus with hyperglycemia: Secondary | ICD-10-CM | POA: Diagnosis not present

## 2020-07-11 DIAGNOSIS — I10 Essential (primary) hypertension: Secondary | ICD-10-CM | POA: Diagnosis not present

## 2020-07-11 DIAGNOSIS — E782 Mixed hyperlipidemia: Secondary | ICD-10-CM | POA: Diagnosis not present

## 2020-07-11 LAB — POCT GLYCOSYLATED HEMOGLOBIN (HGB A1C): Hemoglobin A1C: 7.5 % — AB (ref 4.0–5.6)

## 2020-07-11 MED ORDER — DEXCOM G6 SENSOR MISC: 6 refills | Status: DC

## 2020-07-11 NOTE — Progress Notes (Signed)
07/11/2020, 3:38 PM             Endocrinology follow-up note   Subjective:    Patient ID: Jesse Rodgers, male    DOB: Aug 11, 1975.  Jesse Rodgers is being seen in follow-up for management of currently uncontrolled symptomatic diabetes requested by  Shawnie Dapper, PA-C.   Past Medical History:  Diagnosis Date  . Arthritis    back   . Depression    takes Zoloft daily  . Diabetes mellitus without complication (HCC)    takes Toujeo and Novolog nightly;average fasting blood sugar runs 150  . History of kidney stones   . Hyperlipidemia    takes Simvastatin daily  . Hypertension    takes Losartan daily   Past Surgical History:  Procedure Laterality Date  . ABDOMINAL SURGERY    . APPENDECTOMY    . BACK SURGERY    . DENTAL SURGERY  12/10/2014   molar tooth #  14,15,16  bone biopsy  of ramus  . PARTIAL COLECTOMY    . TOOTH EXTRACTION N/A 12/10/2014   Procedure: EXTRACTION MOLARS #14,15,16  INTRAORAL AND EXTRAORAL INCISION AND DRAINAGE AND BONE BIOPSY OF LEFT RAMUS;  Surgeon: [name], DDS;  Location: [facility] OR;  Service: Oral Surgery;  Laterality: N/A;   Social History   Socioeconomic History  . Marital status: Married    Spouse name: Not on file  . Number of children: Not on file  . Years of education: Not on file  . Highest education level: Not on file  Occupational History  . Not on file  Tobacco Use  . Smoking status: Former Smoker    Packs/day: 1.00    Years: 15.00    Pack years: 15.00    Quit date: 02/08/2019    Years since quitting: 1.4  . Smokeless tobacco: Never Used  Vaping Use  . Vaping Use: Never used  Substance and Sexual Activity  . Alcohol use: Yes    Comment: occasional  . Drug use: No  . Sexual activity: Yes  Other Topics Concern  . Not on file  Social History Narrative  . Not on file   Social Determinants of Health   Financial Resource  Strain: Not on file  Food Insecurity: Not on file  Transportation Needs: Not on file  Physical Activity: Not on file  Stress: Not on file  Social Connections: Not on file   Outpatient Encounter Medications as of 07/11/2020  Medication Sig  . Continuous Blood Gluc Sensor (DEXCOM G6 SENSOR) MISC Apply new sensor every 10 days as directed.  . furosemide (LASIX) 20 MG tablet Take 20 mg by mouth daily as needed.  Marland Kitchen glucagon (GLUCAGON EMERGENCY) 1 MG injection Inject 1 mg into the vein once as needed.  Marland Kitchen GLUCOSAMINE HCL PO Take by mouth daily.  Marland Kitchen glucose blood (FREESTYLE TEST STRIPS) test strip Use as instructed to monitor glucose 4 times daily, before breakfast and before bed.  . Insulin Aspart FlexPen 100 UNIT/ML SOPN ADMINISTER 8 TO 11 UNITS UNDER THE SKIN THREE TIMES DAILY WITH MEALS  . losartan (COZAAR) 25 MG  tablet Take 25 mg by mouth daily.  . Multiple Vitamin (MULTIVITAMIN WITH MINERALS) TABS tablet Take 1 tablet by mouth daily.  . Omega-3 Fatty Acids (OMEGA 3 PO) Take by mouth daily.  . sertraline (ZOLOFT) 100 MG tablet Take 100 mg by mouth daily.  . simvastatin (ZOCOR) 40 MG tablet Take 40 mg by mouth daily.   . TRESIBA FLEXTOUCH 100 UNIT/ML FlexTouch Pen ADMINISTER 38 UNITS UNDER THE SKIN AT BEDTIME  . [DISCONTINUED] Continuous Blood Gluc Sensor (FREESTYLE LIBRE 14 DAY SENSOR) MISC REPLACE SENSOR ON THE SKIN EVERY 14 DAYS  . [DISCONTINUED] Continuous Blood Gluc Sensor (FREESTYLE LIBRE 14 DAY SENSOR) MISC Apply new sensor every 14 days   No facility-administered encounter medications on file as of 07/11/2020.    ALLERGIES: No Known Allergies  VACCINATION STATUS: Immunization History  Administered Date(s) Administered  . Influenza-Unspecified 11/23/2014    Diabetes He presents for his follow-up diabetic visit. He has type 1 diabetes mellitus. Onset time: He was diagnosed at approximate age of 8 years. His disease course has been improving. There are no hypoglycemic associated  symptoms. Pertinent negatives for hypoglycemia include no confusion, headaches, pallor or seizures. Pertinent negatives for diabetes include no chest pain, no fatigue, no polydipsia, no polyphagia, no polyuria and no weakness. There are no hypoglycemic complications. Symptoms are stable. There are no diabetic complications. Risk factors for coronary artery disease include dyslipidemia, diabetes mellitus, hypertension, family history, male sex, tobacco exposure and sedentary lifestyle. Current diabetic treatment includes intensive insulin program. He is compliant with treatment most of the time. His weight is fluctuating minimally. He is following a generally healthy diet. When asked about meal planning, he reported none. He has not had a previous visit with a dietitian. He never participates in exercise. His home blood glucose trend is fluctuating minimally. (He presents today with his CGM, no logs, showing improving glycemic profile with wide fluctuations depending on his intake level.  His POCT A1c today is 7.5%, improving from 7.8% last visit.  He reports he has started eating healthier and overall feels much better.  He does have nocturnal hypoglycemia, he thinks that it is related to carb consumption with his meal intake.  He did admit that he alters his Novolog dose depending on how much carbs he is eating and what his glucose readings are. ) An ACE inhibitor/angiotensin II receptor blocker is being taken. He sees a podiatrist.Eye exam is current.  Hyperlipidemia This is a chronic problem. The current episode started more than 1 year ago. The problem is uncontrolled. Recent lipid tests were reviewed and are high. Exacerbating diseases include diabetes and obesity. Factors aggravating his hyperlipidemia include fatty foods. Pertinent negatives include no chest pain, myalgias or shortness of breath. Current antihyperlipidemic treatment includes statins. The current treatment provides mild improvement of  lipids. Compliance problems include adherence to diet and adherence to exercise.  Risk factors for coronary artery disease include diabetes mellitus, dyslipidemia, hypertension, male sex, a sedentary lifestyle and obesity.  Hypertension This is a chronic problem. The current episode started more than 1 year ago. The problem has been gradually improving since onset. The problem is controlled. Pertinent negatives include no chest pain, headaches, neck pain, palpitations or shortness of breath. There are no associated agents to hypertension. Risk factors for coronary artery disease include diabetes mellitus, dyslipidemia, male gender, smoking/tobacco exposure, sedentary lifestyle and obesity. Past treatments include ACE inhibitors and diuretics. The current treatment provides mild improvement. Compliance problems include exercise and diet.  Review of systems  Constitutional: + steadily increasing body weight,  current Body mass index is 27.25 kg/m. , no fatigue, no subjective hyperthermia, no subjective hypothermia Eyes: no blurry vision, no xerophthalmia ENT: no sore throat, no nodules palpated in throat, no dysphagia/odynophagia, no hoarseness Cardiovascular: no chest pain, no shortness of breath, no palpitations, no leg swelling Respiratory: no cough, no shortness of breath Gastrointestinal: no nausea/vomiting/diarrhea Musculoskeletal: no muscle/joint aches Skin: no rashes, no hyperemia Neurological: no tremors, no numbness, no tingling, no dizziness Psychiatric: no depression, no anxiety   Objective:    BP (!) 141/82   Pulse 85   Ht  (1.905 m)   Wt 218 lb (98.9 kg)   BMI 27.25 kg/m   Wt Readings from Last 3 Encounters:  07/11/20 218 lb (98.9 kg)  04/09/20 220 lb 12.8 oz (100.2 kg)  08/21/19 214 lb 6.4 oz (97.3 kg)    BP Readings from Last 3 Encounters:  07/11/20 (!) 141/82  04/09/20 (!) 147/84  08/21/19 117/76     Physical Exam- Limited  Constitutional:  Body mass  index is 27.25 kg/m. , not in acute distress, normal state of mind Eyes:  EOMI, no exophthalmos Neck: Supple Cardiovascular: RRR, no murmers, rubs, or gallops, no edema Respiratory: Adequate breathing efforts, no crackles, rales, rhonchi, or wheezing Musculoskeletal: no gross deformities, strength intact in all four extremities, no gross restriction of joint movements Skin:  no rashes, no hyperemia Neurological: no tremor with outstretched hands     Recent Results (from the past 2160 hour(s))  HgB A1c     Status: Abnormal   Collection Time: 07/11/20  3:13 PM  Result Value Ref Range   Hemoglobin A1C 7.5 (A) 4.0 - 5.6 %   HbA1c POC (<> result, manual entry)     HbA1c, POC (prediabetic range)     HbA1c, POC (controlled diabetic range)       Assessment & Plan:   1) Uncontrolled type 1 diabetes mellitus with hyperglycemia (HCC)  - Jesse Rodgers has currently uncontrolled symptomatic type 1 DM since 45 years of age.  He denies any history of diabetes ketoacidosis.  He presents today with his CGM, no logs, showing improving glycemic profile with wide fluctuations depending on his intake level.  His POCT A1c today is 7.5%, improving from 7.8% last visit.  He reports he has started eating healthier and overall feels much better.  He does have nocturnal hypoglycemia, he thinks that it is related to carb consumption with his meal intake.  He did admit that he alters his Novolog dose depending on how much carbs he is eating and what his glucose readings are.  -his diabetes is complicated by chronic heavy smoking and Jesse Rodgers remains at a high risk for more acute and chronic complications which include CAD, CVA, CKD, retinopathy, and neuropathy. These are all discussed in detail with the patient.  - Nutritional counseling repeated at each appointment due to patients tendency to fall back in to old habits.  - The patient admits there is a room for improvement in their diet and drink  choices. -  Suggestion is made for the patient to avoid simple carbohydrates from their diet including Cakes, Sweet Desserts / Pastries, Ice Cream, Soda (diet and regular), Sweet Tea, Candies, Chips, Cookies, Sweet Pastries, Store Bought Juices, Alcohol in Excess of 1-2 drinks a day, Artificial Sweeteners, Coffee Creamer, and "Sugar-free" Products. This will help patient to have stable blood glucose profile and potentially avoid unintended  weight gain.   - I encouraged the patient to switch to unprocessed or minimally processed complex starch and increased protein intake (animal or plant source), fruits, and vegetables.   - Patient is advised to stick to a routine mealtimes to eat 3 meals a day and avoid unnecessary snacks (to snack only to correct hypoglycemia).  - I have approached him with the following individualized plan to manage diabetes and patient agrees:   -He has clearly benefited from his CGM device, however, his insurance contacted him and told him they would no longer be covering his CGM.  He does monitor his glucose with a back up method routinely.  -He will continue to require intensive treatment with basal/bolus insulin in order for him to achieve and maintain control of diabetes to target.   -Based on his improved glycemic profile and recent healthy lifestyle changes, he is advised to continue his current dose of Tresiba at 38 units SQ nightly and continue Novolog 8-11 units TID with meals if glucose is above 90 and he is eating.  Specific instructions on how to titrate insulin dose based on glucose readings given to patient in writing.   -He is advised to continue monitoring blood glucose 4 times daily, before meals and before bedtime using his CGM, and call the clinic if he has readings less than 70 or greater than 300 for 3 tests in a row. (sample dexcom provided from office today.  Sent in Rx to [facility] pharmacy to assist in finding most affordable coverage for him)  - Patient is  warned not to take insulin without proper monitoring per orders. -Adjustment parameters are given for hypo and hyperglycemia in writing.  - Patient specific target  A1c;  LDL, HDL, Triglycerides  were discussed in detail.  2) BP/HTN:  Blood pressure is slightly above target today, overall improved.  He is advised to continue his current blood pressure medications including Losartan 25 mg p.o. daily and Lasix 20 mg po daily.  3) Lipids/HPL:  His most recent lipid panel from 02/06/20 shows uncontrolled LDL of 131 and elevated triglycerides of 385.  He is advised to continue Simvastatin 40 mg po daily at bedtime.  Side effects and precautions discussed with him.  Also advised him to avoid fried foods and butter.  4)  Weight/Diet: His Body mass index is 27.25 kg/m.-not a candidate for major weight loss.  CDE Consult has been initiated , exercise, and detailed carbohydrates information provided.  5) Chronic Care/Health Maintenance: -he is on ARB and Statin medications and is encouraged to initiate and continue to follow up with Ophthalmology, Dentist,  Podiatrist at least yearly or according to recommendations, and he is advised to stay away from smoking.  I have recommended yearly flu vaccine and pneumonia vaccine at least every 5 years; moderate intensity exercise for up to 150 minutes weekly; and sleep for at least 7 hours a day.  - I advised patient to maintain close follow up with Shawnie Dapper, PA-C for primary care needs.     I spent 42 minutes in the care of the patient today including review of labs from CMP, Lipids, Thyroid Function, Hematology (current and previous including abstractions from other facilities); face-to-face time discussing  his blood glucose readings/logs, discussing hypoglycemia and hyperglycemia episodes and symptoms, medications doses, his options of short and long term treatment based on the latest standards of care / guidelines;  discussion about incorporating  lifestyle medicine;  and documenting the encounter.    Please  refer to Patient Instructions for Blood Glucose Monitoring and Insulin/Medications Dosing Guide"  in media tab for additional information. Please  also refer to " Patient Self Inventory" in the Media  tab for reviewed elements of pertinent patient history.  Jesse Rodgers participated in the discussions, expressed understanding, and voiced agreement with the above plans.  All questions were answered to his satisfaction. he is encouraged to contact clinic should he have any questions or concerns prior to his return visit.    Follow up plan: - Return in about 4 months (around 11/11/2020) for Diabetes F/U with A1c in office, Previsit labs, Bring meter and logs.  Ronny Bacon, [facility] [address] [place], [place] [address] Phone: [phone] Fax: [phone]  07/11/2020, 3:38 PM

## 2020-07-11 NOTE — Patient Instructions (Signed)

## 2020-07-12 ENCOUNTER — Telehealth: Payer: Self-pay | Admitting: Nurse Practitioner

## 2020-07-12 DIAGNOSIS — R0683 Snoring: Secondary | ICD-10-CM | POA: Diagnosis not present

## 2020-07-12 DIAGNOSIS — E7849 Other hyperlipidemia: Secondary | ICD-10-CM | POA: Diagnosis not present

## 2020-07-12 DIAGNOSIS — Z1389 Encounter for screening for other disorder: Secondary | ICD-10-CM | POA: Diagnosis not present

## 2020-07-12 DIAGNOSIS — E109 Type 1 diabetes mellitus without complications: Secondary | ICD-10-CM | POA: Diagnosis not present

## 2020-07-12 DIAGNOSIS — E663 Overweight: Secondary | ICD-10-CM | POA: Diagnosis not present

## 2020-07-12 DIAGNOSIS — Z1331 Encounter for screening for depression: Secondary | ICD-10-CM | POA: Diagnosis not present

## 2020-07-12 DIAGNOSIS — Z6827 Body mass index (BMI) 27.0-27.9, adult: Secondary | ICD-10-CM | POA: Diagnosis not present

## 2020-07-12 DIAGNOSIS — E1165 Type 2 diabetes mellitus with hyperglycemia: Secondary | ICD-10-CM | POA: Diagnosis not present

## 2020-07-12 DIAGNOSIS — I10 Essential (primary) hypertension: Secondary | ICD-10-CM | POA: Diagnosis not present

## 2020-07-12 DIAGNOSIS — E1065 Type 1 diabetes mellitus with hyperglycemia: Secondary | ICD-10-CM

## 2020-07-12 MED ORDER — DEXCOM G6 RECEIVER DEVI: 0 refills | Status: DC

## 2020-07-12 NOTE — Telephone Encounter (Signed)
Pt is calling and states that his phone is not compatible with the dexcom and is requesting the meter part be called in.   [facility] [phone] - [place], [place] - [address] AT [facility] [address] Phone:  [phone]  Fax:  [phone]

## 2020-07-12 NOTE — Telephone Encounter (Signed)
Rx sent 

## 2020-07-15 DIAGNOSIS — Z6827 Body mass index (BMI) 27.0-27.9, adult: Secondary | ICD-10-CM | POA: Diagnosis not present

## 2020-07-15 DIAGNOSIS — Z1389 Encounter for screening for other disorder: Secondary | ICD-10-CM | POA: Diagnosis not present

## 2020-07-15 DIAGNOSIS — E663 Overweight: Secondary | ICD-10-CM | POA: Diagnosis not present

## 2020-07-17 ENCOUNTER — Other Ambulatory Visit: Payer: Self-pay | Admitting: Nurse Practitioner

## 2020-07-17 DIAGNOSIS — E1065 Type 1 diabetes mellitus with hyperglycemia: Secondary | ICD-10-CM

## 2020-07-17 MED ORDER — INSULIN ASPART FLEXPEN 100 UNIT/ML ~~LOC~~ SOPN: PEN_INJECTOR | SUBCUTANEOUS | 0 refills | Status: DC

## 2020-08-02 ENCOUNTER — Encounter: Payer: Self-pay | Admitting: Nurse Practitioner

## 2020-08-05 ENCOUNTER — Other Ambulatory Visit: Payer: Self-pay

## 2020-08-05 DIAGNOSIS — E1065 Type 1 diabetes mellitus with hyperglycemia: Secondary | ICD-10-CM

## 2020-08-05 DIAGNOSIS — E119 Type 2 diabetes mellitus without complications: Secondary | ICD-10-CM | POA: Diagnosis not present

## 2020-08-05 DIAGNOSIS — E113293 Type 2 diabetes mellitus with mild nonproliferative diabetic retinopathy without macular edema, bilateral: Secondary | ICD-10-CM | POA: Diagnosis not present

## 2020-08-05 LAB — HM DIABETES EYE EXAM

## 2020-08-05 MED ORDER — DEXCOM G6 SENSOR MISC: 6 refills | Status: DC

## 2020-08-30 ENCOUNTER — Other Ambulatory Visit: Payer: Self-pay

## 2020-08-30 DIAGNOSIS — E1065 Type 1 diabetes mellitus with hyperglycemia: Secondary | ICD-10-CM

## 2020-08-30 MED ORDER — DEXCOM G6 TRANSMITTER MISC
1 refills | Status: DC
Start: 2020-08-30 — End: 2021-04-28

## 2020-08-30 MED ORDER — DEXCOM G6 SENSOR MISC: 6 refills | Status: DC

## 2020-09-23 DIAGNOSIS — G4733 Obstructive sleep apnea (adult) (pediatric): Secondary | ICD-10-CM | POA: Diagnosis not present

## 2020-10-08 DIAGNOSIS — I10 Essential (primary) hypertension: Secondary | ICD-10-CM | POA: Diagnosis not present

## 2020-10-08 DIAGNOSIS — G4733 Obstructive sleep apnea (adult) (pediatric): Secondary | ICD-10-CM | POA: Diagnosis not present

## 2020-10-29 ENCOUNTER — Other Ambulatory Visit: Payer: Self-pay | Admitting: Nurse Practitioner

## 2020-10-29 DIAGNOSIS — E1065 Type 1 diabetes mellitus with hyperglycemia: Secondary | ICD-10-CM

## 2020-11-05 DIAGNOSIS — E1065 Type 1 diabetes mellitus with hyperglycemia: Secondary | ICD-10-CM | POA: Diagnosis not present

## 2020-11-06 LAB — COMPREHENSIVE METABOLIC PANEL
ALT: 78 IU/L — ABNORMAL HIGH (ref 0–44)
AST: 58 IU/L — ABNORMAL HIGH (ref 0–40)
Albumin/Globulin Ratio: 1.8 (ref 1.2–2.2)
Albumin: 4.4 g/dL (ref 4.0–5.0)
Alkaline Phosphatase: 106 IU/L (ref 44–121)
BUN/Creatinine Ratio: 26 — ABNORMAL HIGH (ref 9–20)
BUN: 22 mg/dL (ref 6–24)
Bilirubin Total: 0.3 mg/dL (ref 0.0–1.2)
CO2: 28 mmol/L (ref 20–29)
Calcium: 9.7 mg/dL (ref 8.7–10.2)
Chloride: 99 mmol/L (ref 96–106)
Creatinine, Ser: 0.84 mg/dL (ref 0.76–1.27)
Globulin, Total: 2.5 g/dL (ref 1.5–4.5)
Glucose: 119 mg/dL — ABNORMAL HIGH (ref 65–99)
Potassium: 4.5 mmol/L (ref 3.5–5.2)
Sodium: 139 mmol/L (ref 134–144)
Total Protein: 6.9 g/dL (ref 6.0–8.5)
eGFR: 108 mL/min/{1.73_m2} (ref >59)

## 2020-11-08 DIAGNOSIS — I10 Essential (primary) hypertension: Secondary | ICD-10-CM | POA: Diagnosis not present

## 2020-11-08 DIAGNOSIS — G4733 Obstructive sleep apnea (adult) (pediatric): Secondary | ICD-10-CM | POA: Diagnosis not present

## 2020-11-11 ENCOUNTER — Other Ambulatory Visit: Payer: Self-pay

## 2020-11-11 ENCOUNTER — Ambulatory Visit (INDEPENDENT_AMBULATORY_CARE_PROVIDER_SITE_OTHER): Payer: BC Managed Care – PPO | Admitting: Nurse Practitioner

## 2020-11-11 ENCOUNTER — Encounter: Payer: Self-pay | Admitting: Nurse Practitioner

## 2020-11-11 VITALS — BP 151/97 | HR 109 | Ht 75.0 in | Wt 223.0 lb

## 2020-11-11 DIAGNOSIS — I10 Essential (primary) hypertension: Secondary | ICD-10-CM

## 2020-11-11 DIAGNOSIS — E782 Mixed hyperlipidemia: Secondary | ICD-10-CM

## 2020-11-11 DIAGNOSIS — E1065 Type 1 diabetes mellitus with hyperglycemia: Secondary | ICD-10-CM | POA: Diagnosis not present

## 2020-11-11 LAB — POCT GLYCOSYLATED HEMOGLOBIN (HGB A1C): Hemoglobin A1C: 7.1 % — AB (ref 4.0–5.6)

## 2020-11-11 MED ORDER — ROSUVASTATIN CALCIUM 20 MG PO TABS
20.0000 mg | ORAL_TABLET | Freq: Every day | ORAL | 3 refills | Status: DC
Start: 2020-11-11 — End: 2021-11-06

## 2020-11-11 MED ORDER — TRESIBA FLEXTOUCH 100 UNIT/ML ~~LOC~~ SOPN: 55.0000 [IU] | PEN_INJECTOR | Freq: Every day | SUBCUTANEOUS | 0 refills | Status: DC

## 2020-11-11 MED ORDER — INSULIN ASPART FLEXPEN 100 UNIT/ML ~~LOC~~ SOPN: 8.0000 [IU] | PEN_INJECTOR | Freq: Three times a day (TID) | SUBCUTANEOUS | 3 refills | Status: DC

## 2020-11-11 NOTE — Progress Notes (Signed)
11/11/2020, 5:17 PM             Endocrinology follow-up note   Subjective:    Patient ID: Jesse Rodgers, male    DOB: 03-07-1975.  Jesse Rodgers is being seen in follow-up for management of currently uncontrolled symptomatic diabetes requested by  Cory Munch, PA-C.   Past Medical History:  Diagnosis Date   Arthritis    back    Depression    takes Zoloft daily   Diabetes mellitus without complication ([place])    takes Toujeo and Novolog nightly;average fasting blood sugar runs 150   History of kidney stones    Hyperlipidemia    takes Simvastatin daily   Hypertension    takes Losartan daily   Past Surgical History:  Procedure Laterality Date   ABDOMINAL SURGERY     APPENDECTOMY     BACK SURGERY     DENTAL SURGERY  12/10/2014   molar tooth #  [id number]  bone biopsy  of ramus   PARTIAL COLECTOMY     TOOTH EXTRACTION N/A 12/10/2014   Procedure: EXTRACTION MOLARS #14,15,16  INTRAORAL AND EXTRAORAL INCISION AND DRAINAGE AND BONE BIOPSY OF LEFT RAMUS;  Surgeon: Tamela Oddi, DDS;  Location: [place] [facility];  Service: Oral Surgery;  Laterality: N/A;   Social History   Socioeconomic History   Marital status: Married    Spouse name: Not on file   Number of children: Not on file   Years of education: Not on file   Highest education level: Not on file  Occupational History   Not on file  Tobacco Use   Smoking status: Former    Packs/day: 1.00    Years: 15.00    Pack years: 15.00    Types: Cigarettes    Quit date: 02/08/2019    Years since quitting: 1.7   Smokeless tobacco: Never  Vaping Use   Vaping Use: Never used  Substance and Sexual Activity   Alcohol use: Yes    Comment: occasional   Drug use: No   Sexual activity: Yes  Other Topics Concern   Not on file  Social History Narrative   Not on file   Social Determinants of Health   Financial Resource Strain: Not on file   Food Insecurity: Not on file  Transportation Needs: Not on file  Physical Activity: Not on file  Stress: Not on file  Social Connections: Not on file   Outpatient Encounter Medications as of 11/11/2020  Medication Sig   Continuous Blood Gluc Receiver (DEXCOM G6 RECEIVER) DEVI Use to check glucose four times daily as directed.   Continuous Blood Gluc Sensor (DEXCOM G6 SENSOR) MISC Apply new sensor every 10 days as directed.   Continuous Blood Gluc Transmit (DEXCOM G6 TRANSMITTER) MISC Use to test BG as directed 4-5 times daily   furosemide (LASIX) 20 MG tablet Take 20 mg by mouth daily as needed.   glucagon (GLUCAGON EMERGENCY) 1 MG injection Inject 1 mg into the vein once as needed.   GLUCOSAMINE HCL PO Take by mouth daily.   glucose blood (FREESTYLE TEST STRIPS) test strip  Use as instructed to monitor glucose 4 times daily, before breakfast and before bed.   losartan (COZAAR) 25 MG tablet Take 25 mg by mouth daily.   Multiple Vitamin (MULTIVITAMIN WITH MINERALS) TABS tablet Take 1 tablet by mouth daily.   Omega-3 Fatty Acids (OMEGA 3 PO) Take by mouth daily.   rosuvastatin (CRESTOR) 20 MG tablet Take 1 tablet (20 mg total) by mouth daily.   sertraline (ZOLOFT) 100 MG tablet Take 100 mg by mouth daily.   [DISCONTINUED] Insulin Aspart FlexPen (NOVOLOG) 100 UNIT/ML ADMINISTER 8 TO 11 UNITS UNDER THE SKIN THREE TIMES DAILY BEFORE MEALS (Patient taking differently: ADMINISTER 10 TO 15 UNITS UNDER THE SKIN THREE TIMES DAILY BEFORE MEALS)   [DISCONTINUED] simvastatin (ZOCOR) 40 MG tablet Take 40 mg by mouth daily.    [DISCONTINUED] TRESIBA FLEXTOUCH 100 UNIT/ML FlexTouch Pen ADMINISTER 38 UNITS UNDER THE SKIN AT BEDTIME (Patient taking differently: Inject 44 Units into the skin daily.)   Insulin Aspart FlexPen (NOVOLOG) 100 UNIT/ML Inject 8-14 Units into the skin 3 (three) times daily with meals. ADMINISTER 8 TO 11 UNITS UNDER THE SKIN THREE TIMES DAILY BEFORE MEALS   insulin degludec (TRESIBA  FLEXTOUCH) 100 UNIT/ML FlexTouch Pen Inject 55 Units into the skin at bedtime.   No facility-administered encounter medications on file as of 11/11/2020.    ALLERGIES: No Known Allergies  VACCINATION STATUS: Immunization History  Administered Date(s) Administered   Influenza-Unspecified 11/23/2014    Diabetes He presents for his follow-up diabetic visit. He has type 1 diabetes mellitus. Onset time: He was diagnosed at approximate age of 68 years. His disease course has been improving. Hypoglycemia symptoms include sweats and tremors. Pertinent negatives for hypoglycemia include no confusion, headaches, pallor or seizures. Pertinent negatives for diabetes include no chest pain, no fatigue, no polydipsia, no polyphagia, no polyuria and no weakness. There are no hypoglycemic complications. Symptoms are stable. There are no diabetic complications. Risk factors for coronary artery disease include dyslipidemia, diabetes mellitus, hypertension, family history, male sex, tobacco exposure and sedentary lifestyle. Current diabetic treatment includes intensive insulin program. He is compliant with treatment most of the time. His weight is increasing steadily. He is following a generally unhealthy diet. When asked about meal planning, he reported none. He has not had a previous visit with a dietitian. He never participates in exercise. His home blood glucose trend is increasing steadily. (He presents today with his CGM, no logs, showing increasing glycemic profile.  His POCT A1c today is 7.1% improving from last visit of 7.5% which is shocking to him as he reports he has recently "fallen off the bandwagon" after having covid.  Analysis of his CGM shows TIR 47%, TAR 51%, TBR 2%.  He did adjust his insulin dose independently based on his hyperglycemia. ) An ACE inhibitor/angiotensin II receptor blocker is being taken. He sees a podiatrist.Eye exam is current.  Hyperlipidemia This is a chronic problem. The current  episode started more than 1 year ago. The problem is uncontrolled. Recent lipid tests were reviewed and are high. Exacerbating diseases include diabetes and obesity. Factors aggravating his hyperlipidemia include fatty foods. Pertinent negatives include no chest pain, myalgias or shortness of breath. Current antihyperlipidemic treatment includes statins. The current treatment provides mild improvement of lipids. Compliance problems include adherence to diet and adherence to exercise.  Risk factors for coronary artery disease include diabetes mellitus, dyslipidemia, hypertension, male sex, a sedentary lifestyle and obesity.  Hypertension This is a chronic problem. The current episode started more than  1 year ago. The problem has been gradually improving since onset. The problem is uncontrolled. Associated symptoms include sweats. Pertinent negatives include no chest pain, headaches, neck pain, palpitations or shortness of breath. There are no associated agents to hypertension. Risk factors for coronary artery disease include diabetes mellitus, dyslipidemia, male gender, smoking/tobacco exposure, sedentary lifestyle and obesity. Past treatments include ACE inhibitors and diuretics. The current treatment provides mild improvement. Compliance problems include exercise and diet.    Review of systems  Constitutional: + steadily increasing body weight,  current Body mass index is 27.87 kg/m. , no fatigue, no subjective hyperthermia, no subjective hypothermia Eyes: no blurry vision, no xerophthalmia ENT: no sore throat, no nodules palpated in throat, no dysphagia/odynophagia, no hoarseness Cardiovascular: no chest pain, no shortness of breath, no palpitations, no leg swelling Respiratory: no cough, no shortness of breath Gastrointestinal: no nausea/vomiting/diarrhea Musculoskeletal: no muscle/joint aches Skin: no rashes, no hyperemia Neurological: no tremors, no numbness, no tingling, no  dizziness Psychiatric: no depression, no anxiety   Objective:    BP (!) 151/97   Pulse (!) 109   Ht _0  (1.905 m)   Wt 223 lb (101.2 kg)   BMI 27.87 kg/m   Wt Readings from Last 3 Encounters:  11/11/20 223 lb (101.2 kg)  07/11/20 218 lb (98.9 kg)  04/09/20 220 lb 12.8 oz (100.2 kg)    BP Readings from Last 3 Encounters:  11/11/20 (!) 151/97  07/11/20 (!) 141/82  04/09/20 (!) 147/84    Physical Exam- Limited  Constitutional:  Body mass index is 27.87 kg/m. , not in acute distress, normal state of mind Eyes:  EOMI, no exophthalmos Neck: Supple Cardiovascular: RRR, no murmurs, rubs, or gallops, no edema Respiratory: Adequate breathing efforts, no crackles, rales, rhonchi, or wheezing Musculoskeletal: no gross deformities, strength intact in all four extremities, no gross restriction of joint movements Skin:  no rashes, no hyperemia Neurological: no tremor with outstretched hands  Diabetic Foot Exam - Simple   Simple Foot Form Diabetic Foot exam was performed with the following findings: Yes 11/11/2020  5:13 PM  Visual Inspection See comments: Yes Sensation Testing Intact to touch and monofilament testing bilaterally: Yes Pulse Check Posterior Tibialis and Dorsalis pulse intact bilaterally: Yes Comments Calluses bilaterally.  Left foot great toe- nail is just now starting to grow back after traumatic injury several months ago      Recent Results (from the past 2160 hour(s))  Comprehensive metabolic panel     Status: Abnormal   Collection Time: 11/05/20  3:50 PM  Result Value Ref Range   Glucose 119 (H) 65 - 99 mg/dL   BUN 22 6 - 24 mg/dL   Creatinine, Ser 0.84 0.76 - 1.27 mg/dL   eGFR 108 >59 mL/min/1.73   BUN/Creatinine Ratio 26 (H) 9 - 20   Sodium 139 134 - 144 mmol/L   Potassium 4.5 3.5 - 5.2 mmol/L   Chloride 99 96 - 106 mmol/L   CO2 28 20 - 29 mmol/L   Calcium 9.7 8.7 - 10.2 mg/dL   Total Protein 6.9 6.0 - 8.5 g/dL   Albumin 4.4 4.0 - 5.0 g/dL    Globulin, Total 2.5 1.5 - 4.5 g/dL   Albumin/Globulin Ratio 1.8 1.2 - 2.2   Bilirubin Total 0.3 0.0 - 1.2 mg/dL   Alkaline Phosphatase 106 44 - 121 IU/L   AST 58 (H) 0 - 40 IU/L   ALT 78 (H) 0 - 44 IU/L  POCT glycosylated hemoglobin (Hb A1C)  Status: Abnormal   Collection Time: 11/11/20  3:35 PM  Result Value Ref Range   Hemoglobin A1C 7.1 (A) 4.0 - 5.6 %   HbA1c POC (<> result, manual entry)     HbA1c, POC (prediabetic range)     HbA1c, POC (controlled diabetic range)       Assessment & Plan:   1) Uncontrolled type 1 diabetes mellitus with hyperglycemia ([facility])  - Jesse Rodgers has currently uncontrolled symptomatic type 1 DM since 45 years of age.  He denies any history of diabetes ketoacidosis.  He presents today with his CGM, no logs, showing increasing glycemic profile.  His POCT A1c today is 7.1% improving from last visit of 7.5% which is shocking to him as he reports he has recently "fallen off the bandwagon" after having covid.  Analysis of his CGM shows TIR 47%, TAR 51%, TBR 2%.  He did adjust his insulin dose independently based on his hyperglycemia.  -his diabetes is complicated by chronic heavy smoking and Jesse Rodgers remains at a high risk for more acute and chronic complications which include CAD, CVA, CKD, retinopathy, and neuropathy. These are all discussed in detail with the patient.  - Nutritional counseling repeated at each appointment due to patients tendency to fall back in to old habits.  - The patient admits there is a room for improvement in their diet and drink choices. -  Suggestion is made for the patient to avoid simple carbohydrates from their diet including Cakes, Sweet Desserts / Pastries, Ice Cream, Soda (diet and regular), Sweet Tea, Candies, Chips, Cookies, Sweet Pastries, Store Bought Juices, Alcohol in Excess of 1-2 drinks a day, Artificial Sweeteners, Coffee Creamer, and "Sugar-free" Products. This will help patient to have stable blood  glucose profile and potentially avoid unintended weight gain.   - I encouraged the patient to switch to unprocessed or minimally processed complex starch and increased protein intake (animal or plant source), fruits, and vegetables.   - Patient is advised to stick to a routine mealtimes to eat 3 meals a day and avoid unnecessary snacks (to snack only to correct hypoglycemia).  - I have approached him with the following individualized plan to manage diabetes and patient agrees:   -He has clearly benefited from his CGM device, he is advised to continue using it.  -He will continue to require intensive treatment with basal/bolus insulin in order for him to achieve and maintain control of diabetes to target.   -Based on his persistent hyperglycemia, he is advised to increase his [place] to 55 units SQ nightly and continue his Novolog 8-14 units TID with meals if glucose is above 90 and he is eating (Specific instructions on how to titrate insulin dosage based on glucose readings given to patient in writing).   -He is advised to continue monitoring blood glucose 4 times daily, before meals and before bedtime using his CGM, and call the clinic if he has readings less than 70 or greater than 300 for 3 tests in a row.   - Patient is warned not to take insulin without proper monitoring per orders. -Adjustment parameters are given for hypo and hyperglycemia in writing.  - Patient specific target  A1c;  LDL, HDL, Triglycerides  were discussed in detail.  2) BP/HTN:  Blood pressure is slightly above target today, overall improved.  He is advised to continue his current blood pressure medications including Losartan 25 mg p.o. daily and Lasix 20 mg po daily.  3) Lipids/HPL:  His  most recent lipid panel from 02/06/20 shows uncontrolled LDL of 131 and elevated triglycerides of 385.  His PCP recommended he double his Simvastatin to 80 mg po nightly.  His LFTs are moderately elevated today.  Will switch him to  Crestor 20 mg po nightly to see if he responds better.  Side effects and precautions discussed with him.  Also advised him to avoid fried foods and butter.  Will recheck lipid panel prior to next visit.    4)  Weight/Diet: His Body mass index is 27.87 kg/m.-not a candidate for major weight loss.  CDE Consult has been initiated , exercise, and detailed carbohydrates information provided.  5) Chronic Care/Health Maintenance: -he is on ARB and Statin medications and is encouraged to initiate and continue to follow up with Ophthalmology, Dentist,  Podiatrist at least yearly or according to recommendations, and he is advised to stay away from smoking.  I have recommended yearly flu vaccine and pneumonia vaccine at least every 5 years; moderate intensity exercise for up to 150 minutes weekly; and sleep for at least 7 hours a day.  - I advised patient to maintain close follow up with Cory Munch, PA-C for primary care needs.      I spent 41 minutes in the care of the patient today including review of labs from [facility], Lipids, Thyroid Function, Hematology (current and previous including abstractions from other facilities); face-to-face time discussing  his blood glucose readings/logs, discussing hypoglycemia and hyperglycemia episodes and symptoms, medications doses, his options of short and long term treatment based on the latest standards of care / guidelines;  discussion about incorporating lifestyle medicine;  and documenting the encounter.    Please refer to Patient Instructions for Blood Glucose Monitoring and Insulin/Medications Dosing Guide"  in media tab for additional information. Please  also refer to " Patient Self Inventory" in the Media  tab for reviewed elements of pertinent patient history.  Jesse Rodgers participated in the discussions, expressed understanding, and voiced agreement with the above plans.  All questions were answered to his satisfaction. he is encouraged to contact  clinic should he have any questions or concerns prior to his return visit.    Follow up plan: - Return in about 3 months (around 02/10/2021) for Diabetes F/U- A1c and UM in office, Previsit labs, Bring meter and logs.  Rayetta Pigg, [facility] [address] [place], [place] [address] Phone: [phone] Fax: [phone]  11/11/2020, 5:17 PM

## 2020-11-11 NOTE — Patient Instructions (Signed)
Diabetes Mellitus and Nutrition, Adult When you have diabetes, or diabetes mellitus, it is very important to have healthy eating habits because your blood sugar (glucose) levels are greatly affected by what you eat and drink. Eating healthy foods in the right amounts, at about the same times every day, can help you:  Control your blood glucose.  Lower your risk of heart disease.  Improve your blood pressure.  Reach or maintain a healthy weight. What can affect my meal plan? Every person with diabetes is different, and each person has different needs for a meal plan. Your health care provider may recommend that you work with a dietitian to make a meal plan that is best for you. Your meal plan may vary depending on factors such as:  The calories you need.  The medicines you take.  Your weight.  Your blood glucose, blood pressure, and cholesterol levels.  Your activity level.  Other health conditions you have, such as heart or kidney disease. How do carbohydrates affect me? Carbohydrates, also called carbs, affect your blood glucose level more than any other type of food. Eating carbs naturally raises the amount of glucose in your blood. Carb counting is a method for keeping track of how many carbs you eat. Counting carbs is important to keep your blood glucose at a healthy level, especially if you use insulin or take certain oral diabetes medicines. It is important to know how many carbs you can safely have in each meal. This is different for every person. Your dietitian can help you calculate how many carbs you should have at each meal and for each snack. How does alcohol affect me? Alcohol can cause a sudden decrease in blood glucose (hypoglycemia), especially if you use insulin or take certain oral diabetes medicines. Hypoglycemia can be a life-threatening condition. Symptoms of hypoglycemia, such as sleepiness, dizziness, and confusion, are similar to symptoms of having too much  alcohol.  Do not drink alcohol if: ? Your health care provider tells you not to drink. ? You are pregnant, may be pregnant, or are planning to become pregnant.  If you drink alcohol: ? Do not drink on an empty stomach. ? Limit how much you use to:  0-1 drink a day for women.  0-2 drinks a day for men. ? Be aware of how much alcohol is in your drink. In the [place] one drink equals one 12 oz bottle of beer (355 mL), one 5 oz glass of wine (148 mL), or one 1 oz glass of hard liquor (44 mL). ? Keep yourself hydrated with water, diet soda, or unsweetened iced tea.  Keep in mind that regular soda, juice, and other mixers may contain a lot of sugar and must be counted as carbs. What are tips for following this plan? Reading food labels  Start by checking the serving size on the "Nutrition Facts" label of packaged foods and drinks. The amount of calories, carbs, fats, and other nutrients listed on the label is based on one serving of the item. Many items contain more than one serving per package.  Check the total grams (g) of carbs in one serving. You can calculate the number of servings of carbs in one serving by dividing the total carbs by 15. For example, if a food has 30 g of total carbs per serving, it would be equal to 2 servings of carbs.  Check the number of grams (g) of saturated fats and trans fats in one serving. Choose foods that have   a low amount or none of these fats.  Check the number of milligrams (mg) of salt (sodium) in one serving. Most people should limit total sodium intake to less than 2,300 mg per day.  Always check the nutrition information of foods labeled as "low-fat" or "nonfat." These foods may be higher in added sugar or refined carbs and should be avoided.  Talk to your dietitian to identify your daily goals for nutrients listed on the label. Shopping  Avoid buying canned, pre-made, or processed foods. These foods tend to be high in fat, sodium, and added  sugar.  Shop around the outside edge of the grocery store. This is where you will most often find fresh fruits and vegetables, bulk grains, fresh meats, and fresh dairy. Cooking  Use low-heat cooking methods, such as baking, instead of high-heat cooking methods like deep frying.  Cook using healthy oils, such as olive, canola, or sunflower oil.  Avoid cooking with butter, cream, or high-fat meats. Meal planning  Eat meals and snacks regularly, preferably at the same times every day. Avoid going long periods of time without eating.  Eat foods that are high in fiber, such as fresh fruits, vegetables, beans, and whole grains. Talk with your dietitian about how many servings of carbs you can eat at each meal.  Eat 4-6 oz (112-168 g) of lean protein each day, such as lean meat, chicken, fish, eggs, or tofu. One ounce (oz) of lean protein is equal to: ? 1 oz (28 g) of meat, chicken, or fish. ? 1 egg. ?  cup (62 g) of tofu.  Eat some foods each day that contain healthy fats, such as avocado, nuts, seeds, and fish.   What foods should I eat? Fruits Berries. Apples. Oranges. Peaches. Apricots. Plums. Grapes. Mango. Papaya. Pomegranate. Kiwi. Cherries. Vegetables Lettuce. Spinach. Leafy greens, including kale, chard, collard greens, and mustard greens. Beets. Cauliflower. Cabbage. Broccoli. Carrots. Green beans. Tomatoes. Peppers. Onions. Cucumbers. Brussels sprouts. Grains Whole grains, such as whole-wheat or whole-grain bread, crackers, tortillas, cereal, and pasta. Unsweetened oatmeal. Quinoa. Brown or wild rice. Meats and other proteins Seafood. Poultry without skin. Lean cuts of poultry and beef. Tofu. Nuts. Seeds. Dairy Low-fat or fat-free dairy products such as milk, yogurt, and cheese. The items listed above may not be a complete list of foods and beverages you can eat. Contact a dietitian for more information. What foods should I avoid? Fruits Fruits canned with  syrup. Vegetables Canned vegetables. Frozen vegetables with butter or cream sauce. Grains Refined white flour and flour products such as bread, pasta, snack foods, and cereals. Avoid all processed foods. Meats and other proteins Fatty cuts of meat. Poultry with skin. Breaded or fried meats. Processed meat. Avoid saturated fats. Dairy Full-fat yogurt, cheese, or milk. Beverages Sweetened drinks, such as soda or iced tea. The items listed above may not be a complete list of foods and beverages you should avoid. Contact a dietitian for more information. Questions to ask a health care provider  Do I need to meet with a diabetes educator?  Do I need to meet with a dietitian?  What number can I call if I have questions?  When are the best times to check my blood glucose? Where to find more information:  American Diabetes Association: diabetes.org  Academy of Nutrition and Dietetics: www.eatright.org  National Institute of Diabetes and Digestive and Kidney Diseases: www.niddk.nih.gov  Association of Diabetes Care and Education Specialists: www.diabeteseducator.org Summary  It is important to have healthy eating   habits because your blood sugar (glucose) levels are greatly affected by what you eat and drink.  A healthy meal plan will help you control your blood glucose and maintain a healthy lifestyle.  Your health care provider may recommend that you work with a dietitian to make a meal plan that is best for you.  Keep in mind that carbohydrates (carbs) and alcohol have immediate effects on your blood glucose levels. It is important to count carbs and to use alcohol carefully. This information is not intended to replace advice given to you by your health care provider. Make sure you discuss any questions you have with your health care provider. Document Revised: 01/24/2019 Document Reviewed: 01/24/2019 Elsevier Patient Education  2021 Elsevier Inc.  

## 2020-11-27 ENCOUNTER — Other Ambulatory Visit: Payer: Self-pay

## 2020-11-27 ENCOUNTER — Other Ambulatory Visit: Payer: Self-pay | Admitting: Nurse Practitioner

## 2020-11-27 MED ORDER — TRESIBA FLEXTOUCH 100 UNIT/ML ~~LOC~~ SOPN
55.0000 [IU] | PEN_INJECTOR | Freq: Every day | SUBCUTANEOUS | 3 refills | Status: DC
  Filled 2020-11-27: qty 45, 81d supply, fill #0

## 2020-11-28 ENCOUNTER — Other Ambulatory Visit: Payer: Self-pay

## 2020-11-29 ENCOUNTER — Other Ambulatory Visit: Payer: Self-pay

## 2020-11-29 ENCOUNTER — Telehealth: Payer: Self-pay | Admitting: Nurse Practitioner

## 2020-12-02 ENCOUNTER — Other Ambulatory Visit: Payer: Self-pay

## 2020-12-02 MED ORDER — TRESIBA FLEXTOUCH 100 UNIT/ML ~~LOC~~ SOPN: 55.0000 [IU] | PEN_INJECTOR | Freq: Every day | SUBCUTANEOUS | 3 refills | Status: DC

## 2020-12-02 NOTE — Telephone Encounter (Signed)
Pt is calling and states [facility] not [facility] because they are saying he has already gotten it filled this month. Pt does not have any for tonight so I did give him a sample to get him through tonight

## 2020-12-02 NOTE — Telephone Encounter (Signed)
Pt states he does not use [facility] pharmacy and is not sure why his Jesse Rodgers was sent there - can you send it to [facility] on [address]

## 2020-12-02 NOTE — Telephone Encounter (Signed)
Prescription has been sent to the pharmacy requested. ?

## 2020-12-03 ENCOUNTER — Other Ambulatory Visit: Payer: Self-pay

## 2020-12-03 NOTE — Telephone Encounter (Signed)
[facility] pharmacy and spoke to the pharmacist and he is going to call the [facility] pharmacy to cancel the refill so that [facility] can fill for the patient. Pharmacist verbalized an understanding.  Called patient and let him know that the [facility] pharmacy will resolve for  him. Patient verbalized an understanding.

## 2020-12-06 ENCOUNTER — Other Ambulatory Visit: Payer: Self-pay

## 2020-12-08 DIAGNOSIS — G4733 Obstructive sleep apnea (adult) (pediatric): Secondary | ICD-10-CM | POA: Diagnosis not present

## 2020-12-08 DIAGNOSIS — I10 Essential (primary) hypertension: Secondary | ICD-10-CM | POA: Diagnosis not present

## 2020-12-26 ENCOUNTER — Other Ambulatory Visit: Payer: Self-pay | Admitting: Nurse Practitioner

## 2021-01-08 DIAGNOSIS — G4733 Obstructive sleep apnea (adult) (pediatric): Secondary | ICD-10-CM | POA: Diagnosis not present

## 2021-01-08 DIAGNOSIS — I10 Essential (primary) hypertension: Secondary | ICD-10-CM | POA: Diagnosis not present

## 2021-02-07 DIAGNOSIS — I10 Essential (primary) hypertension: Secondary | ICD-10-CM | POA: Diagnosis not present

## 2021-02-07 DIAGNOSIS — G4733 Obstructive sleep apnea (adult) (pediatric): Secondary | ICD-10-CM | POA: Diagnosis not present

## 2021-02-07 DIAGNOSIS — E1065 Type 1 diabetes mellitus with hyperglycemia: Secondary | ICD-10-CM | POA: Diagnosis not present

## 2021-02-08 LAB — COMPREHENSIVE METABOLIC PANEL
ALT: 36 IU/L (ref 0–44)
AST: 30 IU/L (ref 0–40)
Albumin/Globulin Ratio: 1.8 (ref 1.2–2.2)
Albumin: 4.6 g/dL (ref 4.0–5.0)
Alkaline Phosphatase: 98 IU/L (ref 44–121)
BUN/Creatinine Ratio: 19 (ref 9–20)
BUN: 22 mg/dL (ref 6–24)
Bilirubin Total: 0.2 mg/dL (ref 0.0–1.2)
CO2: 24 mmol/L (ref 20–29)
Calcium: 9.2 mg/dL (ref 8.7–10.2)
Chloride: 104 mmol/L (ref 96–106)
Creatinine, Ser: 1.15 mg/dL (ref 0.76–1.27)
Globulin, Total: 2.5 g/dL (ref 1.5–4.5)
Glucose: 110 mg/dL — ABNORMAL HIGH (ref 70–99)
Potassium: 4.5 mmol/L (ref 3.5–5.2)
Sodium: 142 mmol/L (ref 134–144)
Total Protein: 7.1 g/dL (ref 6.0–8.5)
eGFR: 79 mL/min/{1.73_m2} (ref >59)

## 2021-02-08 LAB — LIPID PANEL
Chol/HDL Ratio: 4.1 ratio (ref 0.0–5.0)
Cholesterol, Total: 215 mg/dL — ABNORMAL HIGH (ref 100–199)
HDL: 53 mg/dL (ref >39)
LDL Chol Calc (NIH): 97 mg/dL (ref 0–99)
Triglycerides: 393 mg/dL — ABNORMAL HIGH (ref 0–149)
VLDL Cholesterol Cal: 65 mg/dL — ABNORMAL HIGH (ref 5–40)

## 2021-02-08 LAB — VITAMIN D 25 HYDROXY (VIT D DEFICIENCY, FRACTURES): Vit D, 25-Hydroxy: 23.5 ng/mL — ABNORMAL LOW (ref 30.0–100.0)

## 2021-02-08 LAB — TSH: TSH: 2.87 u[IU]/mL (ref 0.450–4.500)

## 2021-02-08 LAB — T4, FREE: Free T4: 0.93 ng/dL (ref 0.82–1.77)

## 2021-02-11 ENCOUNTER — Encounter: Payer: Self-pay | Admitting: Nurse Practitioner

## 2021-02-11 ENCOUNTER — Other Ambulatory Visit: Payer: Self-pay

## 2021-02-11 ENCOUNTER — Ambulatory Visit (INDEPENDENT_AMBULATORY_CARE_PROVIDER_SITE_OTHER): Payer: BC Managed Care – PPO | Admitting: Nurse Practitioner

## 2021-02-11 VITALS — BP 122/75 | HR 97 | Ht 75.0 in | Wt 230.4 lb

## 2021-02-11 DIAGNOSIS — E1065 Type 1 diabetes mellitus with hyperglycemia: Secondary | ICD-10-CM

## 2021-02-11 DIAGNOSIS — E782 Mixed hyperlipidemia: Secondary | ICD-10-CM | POA: Diagnosis not present

## 2021-02-11 DIAGNOSIS — I10 Essential (primary) hypertension: Secondary | ICD-10-CM

## 2021-02-11 LAB — POCT GLYCOSYLATED HEMOGLOBIN (HGB A1C): HbA1c, POC (controlled diabetic range): 6.1 % (ref 0.0–7.0)

## 2021-02-11 NOTE — Progress Notes (Signed)
02/11/2021, 4:18 PM             Endocrinology follow-up note   Subjective:    Patient ID: Jesse Rodgers, male    DOB: 13-May-1975.  Jesse Rodgers is being seen in follow-up for management of currently uncontrolled symptomatic diabetes requested by  Cory Munch, PA-C.   Past Medical History:  Diagnosis Date   Arthritis    back    Depression    takes Zoloft daily   Diabetes mellitus without complication ([place])    takes Toujeo and Novolog nightly;average fasting blood sugar runs 150   History of kidney stones    Hyperlipidemia    takes Simvastatin daily   Hypertension    takes Losartan daily   Past Surgical History:  Procedure Laterality Date   ABDOMINAL SURGERY     APPENDECTOMY     BACK SURGERY     DENTAL SURGERY  12/10/2014   molar tooth #  14,15,16  bone biopsy  of ramus   PARTIAL COLECTOMY     TOOTH EXTRACTION N/A 12/10/2014   Procedure: EXTRACTION MOLARS #14,15,16  INTRAORAL AND EXTRAORAL INCISION AND DRAINAGE AND BONE BIOPSY OF LEFT RAMUS;  Surgeon: Tamela Oddi, DDS;  Location: [place];  Service: Oral Surgery;  Laterality: N/A;   Social History   Socioeconomic History   Marital status: Married    Spouse name: Not on file   Number of children: Not on file   Years of education: Not on file   Highest education level: Not on file  Occupational History   Not on file  Tobacco Use   Smoking status: Former    Packs/day: 1.00    Years: 15.00    Pack years: 15.00    Types: Cigarettes    Quit date: 02/08/2019    Years since quitting: 2.0   Smokeless tobacco: Never  Vaping Use   Vaping Use: Never used  Substance and Sexual Activity   Alcohol use: Yes    Comment: occasional   Drug use: No   Sexual activity: Yes  Other Topics Concern   Not on file  Social History Narrative   Not on file   Social Determinants of Health   Financial Resource Strain: Not on file   Food Insecurity: Not on file  Transportation Needs: Not on file  Physical Activity: Not on file  Stress: Not on file  Social Connections: Not on file   Outpatient Encounter Medications as of 02/11/2021  Medication Sig   Continuous Blood Gluc Receiver (DEXCOM G6 RECEIVER) DEVI Use to check glucose four times daily as directed.   Continuous Blood Gluc Sensor (DEXCOM G6 SENSOR) MISC Apply new sensor every 10 days as directed.   Continuous Blood Gluc Transmit (DEXCOM G6 TRANSMITTER) MISC Use to test BG as directed 4-5 times daily   furosemide (LASIX) 20 MG tablet Take 20 mg by mouth daily as needed.   glucagon (GLUCAGON EMERGENCY) 1 MG injection Inject 1 mg into the vein once as  Use as instructed to monitor glucose 4 times daily, before breakfast and before bed.  ° Insulin Aspart FlexPen (NOVOLOG) 100 UNIT/ML Inject 8-14 Units into the skin 3 (three) times daily with meals. ADMINISTER 8 TO 11 UNITS UNDER THE SKIN THREE TIMES DAILY BEFORE MEALS  ° insulin degludec (TRESIBA FLEXTOUCH) 100 UNIT/ML FlexTouch Pen Inject 55 Units into the skin at bedtime.  ° losartan (COZAAR) 25 MG tablet Take 25 mg by mouth daily.  ° Multiple Vitamin (MULTIVITAMIN WITH MINERALS) TABS tablet Take 1 tablet by mouth daily.  ° Omega-3 Fatty Acids (OMEGA 3 PO) Take by mouth daily.  ° rosuvastatin (CRESTOR) 20 MG tablet Take 1 tablet (20 mg total) by mouth daily.  ° sertraline (ZOLOFT) 100 MG tablet Take 100 mg by mouth daily.  ° °No facility-administered encounter medications on file as of 02/11/2021.  ° ° °ALLERGIES: °No Known Allergies ° °VACCINATION STATUS: °Immunization History  °Administered Date(s) Administered  ° Influenza-Unspecified 11/23/2014  ° ° °Diabetes °He presents for his follow-up diabetic visit. He has type 1 diabetes mellitus. Onset time: He was diagnosed at approximate age of 8 years. His disease course has  been improving. Hypoglycemia symptoms include sweats and tremors. Pertinent negatives for hypoglycemia include no confusion, headaches, pallor or seizures. Pertinent negatives for diabetes include no chest pain, no fatigue, no polydipsia, no polyphagia, no polyuria and no weakness. There are no hypoglycemic complications. Symptoms are stable. There are no diabetic complications. Risk factors for coronary artery disease include dyslipidemia, diabetes mellitus, hypertension, family history, male sex, tobacco exposure and sedentary lifestyle. Current diabetic treatment includes intensive insulin program. He is compliant with treatment most of the time. His weight is increasing steadily. He is following a generally unhealthy diet. When asked about meal planning, he reported none. He has not had a previous visit with a dietitian. He never participates in exercise. His home blood glucose trend is decreasing steadily. His overall blood glucose range is 140-180 mg/dl. (He presents today with his CGM, no logs, showing at goal fasting and postprandial glycemic profile.  His POCT A1c today is 6.1%, improving from last visit of 7.1%.  He did increase his [place] to 61 units on his own.  Analysis of his CGM shows TIR 68%, TAR 28%, TBR 4%.  He does have some fasting hypoglycemia noted.) An ACE inhibitor/angiotensin II receptor blocker is being taken. He sees a podiatrist.Eye exam is current.  °Hyperlipidemia °This is a chronic problem. The current episode started more than 1 year ago. The problem is uncontrolled. Recent lipid tests were reviewed and are variable. Exacerbating diseases include diabetes and obesity. Factors aggravating his hyperlipidemia include fatty foods. Pertinent negatives include no chest pain, myalgias or shortness of breath. Current antihyperlipidemic treatment includes statins. The current treatment provides mild improvement of lipids. Compliance problems include adherence to diet and adherence to  exercise.  Risk factors for coronary artery disease include diabetes mellitus, dyslipidemia, hypertension, male sex, a sedentary lifestyle and obesity.  °Hypertension °This is a chronic problem. The current episode started more than 1 year ago. The problem has been gradually improving since onset. The problem is controlled. Associated symptoms include sweats. Pertinent negatives include no chest pain, headaches, neck pain, palpitations or shortness of breath. There are no associated agents to hypertension. Risk factors for coronary artery disease include diabetes mellitus, dyslipidemia, male gender, smoking/tobacco exposure, sedentary lifestyle and obesity. Past treatments include ACE inhibitors and diuretics. The current treatment provides mild improvement. Compliance problems include exercise and diet.   ° °  Review of systems ° °Constitutional: + steadily increasing body weight,  current Body mass index is 28.8 kg/m². , no fatigue, no subjective hyperthermia, no subjective hypothermia °Eyes: no blurry vision, no xerophthalmia °ENT: no sore throat, no nodules palpated in throat, no dysphagia/odynophagia, no hoarseness °Cardiovascular: no chest pain, no shortness of breath, no palpitations, + edema in bilateral hands °Respiratory: no cough, no shortness of breath °Gastrointestinal: no nausea/vomiting/diarrhea °Musculoskeletal: no muscle/joint aches °Skin: no rashes, no hyperemia °Neurological: no tremors, no numbness, no tingling, no dizziness °Psychiatric: no depression, no anxiety ° ° °Objective:  °  °BP 122/75    Pulse 97    Ht 6' 3" (1.905 m)    Wt 230 lb 6.4 oz (104.5 kg)    BMI 28.80 kg/m²   °Wt Readings from Last 3 Encounters:  °02/11/21 230 lb 6.4 oz (104.5 kg)  °11/11/20 223 lb (101.2 kg)  °07/11/20 218 lb (98.9 kg)  °  °BP Readings from Last 3 Encounters:  °02/11/21 122/75  °11/11/20 (!) 151/97  °07/11/20 (!) 141/82  ° ° °Physical Exam- Limited ° °Constitutional:  Body mass index is 28.8 kg/m². , not in  acute distress, normal state of mind °Eyes:  EOMI, no exophthalmos °Neck: Supple °Cardiovascular: RRR, no murmurs, rubs, or gallops, nonpitting edema to bilateral hands (normal cap refill) °Respiratory: Adequate breathing efforts, no crackles, rales, rhonchi, or wheezing °Musculoskeletal: no gross deformities, strength intact in all four extremities, no gross restriction of joint movements °Skin:  no rashes, no hyperemia °Neurological: no tremor with outstretched hands ° ° ° °Recent Results (from the past 2160 hour(s))  °Comprehensive metabolic panel     Status: Abnormal  ° Collection Time: 02/07/21  2:57 PM  °Result Value Ref Range  ° Glucose 110 (H) 70 - 99 mg/dL  ° BUN 22 6 - 24 mg/dL  ° Creatinine, Ser 1.15 0.76 - 1.27 mg/dL  ° eGFR 79 >59 mL/min/1.73  ° BUN/Creatinine Ratio 19 9 - 20  ° Sodium 142 134 - 144 mmol/L  ° Potassium 4.5 3.5 - 5.2 mmol/L  ° Chloride 104 96 - 106 mmol/L  ° CO2 24 20 - 29 mmol/L  ° Calcium 9.2 8.7 - 10.2 mg/dL  ° Total Protein 7.1 6.0 - 8.5 g/dL  ° Albumin 4.6 4.0 - 5.0 g/dL  ° Globulin, Total 2.5 1.5 - 4.5 g/dL  ° Albumin/Globulin Ratio 1.8 1.2 - 2.2  ° Bilirubin Total 0.2 0.0 - 1.2 mg/dL  ° Alkaline Phosphatase 98 44 - 121 IU/L  ° AST 30 0 - 40 IU/L  ° ALT 36 0 - 44 IU/L  °Lipid panel     Status: Abnormal  ° Collection Time: 02/07/21  2:57 PM  °Result Value Ref Range  ° Cholesterol, Total 215 (H) 100 - 199 mg/dL  ° Triglycerides 393 (H) 0 - 149 mg/dL  ° HDL 53 >39 mg/dL  ° VLDL Cholesterol Cal 65 (H) 5 - 40 mg/dL  ° LDL Chol Calc (NIH) 97 0 - 99 mg/dL  ° Chol/HDL Ratio 4.1 0.0 - 5.0 ratio  °  Comment:                                   T. Chol/HDL Ratio °                                              Men  Women                               1/2 Avg.Risk  3.4    3.3                                   Avg.Risk  5.0    4.4                                2X Avg.Risk  9.6    7.1                                3X Avg.Risk 23.4   11.0   TSH     Status: None   Collection Time: 02/07/21   2:57 PM  Result Value Ref Range   TSH 2.870 0.450 - 4.500 uIU/mL  T4, free     Status: None   Collection Time: 02/07/21  2:57 PM  Result Value Ref Range   Free T4 0.93 0.82 - 1.77 ng/dL  VITAMIN D 25 Hydroxy (Vit-D Deficiency, Fractures)     Status: Abnormal   Collection Time: 02/07/21  2:57 PM  Result Value Ref Range   Vit D, 25-Hydroxy 23.5 (L) 30.0 - 100.0 ng/mL    Comment: Vitamin D deficiency has been defined by the [place] practice guideline as a level of serum 25-OH vitamin D less than 20 ng/mL (1,2). The Endocrine Society went on to further define vitamin D insufficiency as a level between 21 and 29 ng/mL (2). 1. IOM (Institute of Medicine). 2010. Dietary reference    intakes for calcium and D. [place]: The    [employer]. 2. Holick MF, [place], Bischoff-Ferrari HA, et al.    Evaluation, treatment, and prevention of vitamin D    deficiency: an Endocrine Society clinical practice    guideline. JCEM. 2011 Jul; 96(7):1911-30.   POCT glycosylated hemoglobin (Hb A1C)     Status: Normal   Collection Time: 02/11/21  3:49 PM  Result Value Ref Range   Hemoglobin A1C     HbA1c POC (<> result, manual entry)     HbA1c, POC (prediabetic range)     HbA1c, POC (controlled diabetic range) 6.1 0.0 - 7.0 %     Assessment & Plan:   1) Uncontrolled type 1 diabetes mellitus with hyperglycemia (HCC)  - Jesse Rodgers has currently uncontrolled symptomatic type 1 DM since 45 years of age.  He denies any history of diabetes ketoacidosis.  He presents today with his CGM, no logs, showing at goal fasting and postprandial glycemic profile.  His POCT A1c today is 6.1%, improving from last visit of 7.1%.  He did increase his [place] to 61 units on his own.  Analysis of his CGM shows TIR 68%, TAR 28%, TBR 4%.  He does have some fasting hypoglycemia noted.  -his diabetes is complicated by chronic heavy smoking and Jesse Rodgers  remains at a high risk for more acute and chronic complications which include CAD, CVA, CKD, retinopathy, and neuropathy. These are all discussed in detail with the patient.  - Nutritional counseling repeated at each appointment due to patients tendency to fall back in to old  habits.  - The patient admits there is a room for improvement in their diet and drink choices. -  Suggestion is made for the patient to avoid simple carbohydrates from their diet including Cakes, Sweet Desserts / Pastries, Ice Cream, Soda (diet and regular), Sweet Tea, Candies, Chips, Cookies, Sweet Pastries, Store Bought Juices, Alcohol in Excess of 1-2 drinks a day, Artificial Sweeteners, Coffee Creamer, and "Sugar-free" Products. This will help patient to have stable blood glucose profile and potentially avoid unintended weight gain.   - I encouraged the patient to switch to unprocessed or minimally processed complex starch and increased protein intake (animal or plant source), fruits, and vegetables.   - Patient is advised to stick to a routine mealtimes to eat 3 meals a day and avoid unnecessary snacks (to snack only to correct hypoglycemia).  - I have approached him with the following individualized plan to manage diabetes and patient agrees:   -He has clearly benefited from his CGM device, he is advised to continue using it.  -He will continue to require intensive treatment with basal/bolus insulin in order for him to achieve and maintain control of diabetes to target.   -Based on his improved glycemic profile and tight fasting glycemic profile, he is advised to lower his [place] to 58 units SQ nightly and continue his Novolog 8-14 units TID with meals if glucose is above 90 and she is eating (Specific instructions on how to titrate insulin dosage based on glucose readings given to patient in writing).    -He is advised to continue monitoring blood glucose 4 times daily, before meals and before bedtime using his CGM,  and call the clinic if he has readings less than 70 or greater than 300 for 3 tests in a row.   - Patient is warned not to take insulin without proper monitoring per orders. -Adjustment parameters are given for hypo and hyperglycemia in writing.  - Patient specific target  A1c;  LDL, HDL, Triglycerides  were discussed in detail.  2) BP/HTN:  Blood pressure is controlled to target.  He is advised to continue his current blood pressure medications including Losartan 25 mg p.o. daily and Lasix 20 mg po daily.  He is advised to reach back out to his PCP regarding his swelling in his hands.  3) Lipids/HPL:  His most recent lipid panel from 02/07/21 shows controlled LDL of 97 and elevated triglycerides of 393.  He is advised to continue Crestor 20 mg po daily at bedtime and his Omega 3 Fatty acids.    Also advised him to avoid fried foods and butter.     4)  Weight/Diet: His Body mass index is 28.8 kg/m.-not a candidate for major weight loss.  CDE Consult has been initiated , exercise, and detailed carbohydrates information provided.  5) Chronic Care/Health Maintenance: -he is on ARB and Statin medications and is encouraged to initiate and continue to follow up with Ophthalmology, Dentist,  Podiatrist at least yearly or according to recommendations, and he is advised to stay away from smoking.  I have recommended yearly flu vaccine and pneumonia vaccine at least every 5 years; moderate intensity exercise for up to 150 minutes weekly; and sleep for at least 7 hours a day.  - I advised patient to maintain close follow up with Cory Munch, PA-C for primary care needs.     I spent 44 minutes in the care of the patient today including review of labs from CMP, Lipids, Thyroid Function, Hematology (current  and previous including abstractions from other facilities); face-to-face time discussing  his blood glucose readings/logs, discussing hypoglycemia and hyperglycemia episodes and symptoms,  medications doses, his options of short and long term treatment based on the latest standards of care / guidelines;  discussion about incorporating lifestyle medicine;  and documenting the encounter.    Please refer to Patient Instructions for Blood Glucose Monitoring and Insulin/Medications Dosing Guide"  in media tab for additional information. Please  also refer to " Patient Self Inventory" in the Media  tab for reviewed elements of pertinent patient history.  Jesse Rodgers participated in the discussions, expressed understanding, and voiced agreement with the above plans.  All questions were answered to his satisfaction. he is encouraged to contact clinic should he have any questions or concerns prior to his return visit.    Follow up plan: - Return in about 4 months (around 06/12/2021) for Diabetes F/U with A1c in office, No previsit labs, Bring meter and logs.  Rayetta Pigg, [facility] [address] [place], [place] [address] Phone: [phone] Fax: [phone]  02/11/2021, 4:18 PM

## 2021-02-11 NOTE — Patient Instructions (Signed)

## 2021-02-20 ENCOUNTER — Telehealth: Payer: Self-pay | Admitting: Nurse Practitioner

## 2021-02-20 NOTE — Telephone Encounter (Signed)
Called patient and left a detailed voice message for patient to try calling us back and if he is not able to reach [place] to call the Dexcom support number to have them help him.

## 2021-02-20 NOTE — Telephone Encounter (Signed)
Pt called and said he put in a new dexcom sensor and it keeps telling him to insert his sensor and it will not register. Requesting a call back [phone]

## 2021-03-10 DIAGNOSIS — I10 Essential (primary) hypertension: Secondary | ICD-10-CM | POA: Diagnosis not present

## 2021-03-10 DIAGNOSIS — G4733 Obstructive sleep apnea (adult) (pediatric): Secondary | ICD-10-CM | POA: Diagnosis not present

## 2021-04-10 DIAGNOSIS — I10 Essential (primary) hypertension: Secondary | ICD-10-CM | POA: Diagnosis not present

## 2021-04-10 DIAGNOSIS — G4733 Obstructive sleep apnea (adult) (pediatric): Secondary | ICD-10-CM | POA: Diagnosis not present

## 2021-04-26 ENCOUNTER — Other Ambulatory Visit: Payer: Self-pay | Admitting: Nurse Practitioner

## 2021-04-26 ENCOUNTER — Other Ambulatory Visit: Payer: Self-pay | Admitting: "Endocrinology

## 2021-05-08 DIAGNOSIS — G4733 Obstructive sleep apnea (adult) (pediatric): Secondary | ICD-10-CM | POA: Diagnosis not present

## 2021-05-08 DIAGNOSIS — I10 Essential (primary) hypertension: Secondary | ICD-10-CM | POA: Diagnosis not present

## 2021-05-15 DIAGNOSIS — L853 Xerosis cutis: Secondary | ICD-10-CM | POA: Diagnosis not present

## 2021-05-15 DIAGNOSIS — I872 Venous insufficiency (chronic) (peripheral): Secondary | ICD-10-CM | POA: Diagnosis not present

## 2021-05-15 DIAGNOSIS — E1142 Type 2 diabetes mellitus with diabetic polyneuropathy: Secondary | ICD-10-CM | POA: Diagnosis not present

## 2021-05-27 ENCOUNTER — Other Ambulatory Visit: Payer: Self-pay | Admitting: "Endocrinology

## 2021-05-27 DIAGNOSIS — E1065 Type 1 diabetes mellitus with hyperglycemia: Secondary | ICD-10-CM

## 2021-06-08 DIAGNOSIS — G4733 Obstructive sleep apnea (adult) (pediatric): Secondary | ICD-10-CM | POA: Diagnosis not present

## 2021-06-08 DIAGNOSIS — I10 Essential (primary) hypertension: Secondary | ICD-10-CM | POA: Diagnosis not present

## 2021-06-11 NOTE — Patient Instructions (Signed)

## 2021-06-12 ENCOUNTER — Ambulatory Visit (INDEPENDENT_AMBULATORY_CARE_PROVIDER_SITE_OTHER): Payer: BC Managed Care – PPO | Admitting: Nurse Practitioner

## 2021-06-12 ENCOUNTER — Encounter: Payer: Self-pay | Admitting: Nurse Practitioner

## 2021-06-12 VITALS — BP 147/81 | HR 87 | Ht 75.0 in | Wt 233.6 lb

## 2021-06-12 DIAGNOSIS — E782 Mixed hyperlipidemia: Secondary | ICD-10-CM

## 2021-06-12 DIAGNOSIS — I10 Essential (primary) hypertension: Secondary | ICD-10-CM | POA: Diagnosis not present

## 2021-06-12 DIAGNOSIS — E1065 Type 1 diabetes mellitus with hyperglycemia: Secondary | ICD-10-CM

## 2021-06-12 LAB — POCT GLYCOSYLATED HEMOGLOBIN (HGB A1C): HbA1c, POC (controlled diabetic range): 6.9 % (ref 0.0–7.0)

## 2021-06-12 LAB — POCT UA - MICROALBUMIN
Creatinine, POC: 300 mg/dL
Microalbumin Ur, POC: 80 mg/L

## 2021-06-12 MED ORDER — LOSARTAN POTASSIUM 25 MG PO TABS: 25.0000 mg | ORAL_TABLET | Freq: Every day | ORAL | 3 refills | Status: DC

## 2021-06-12 MED ORDER — OMNIPOD 5 DEXG7G6 PODS GEN 5 MISC: 6 refills | Status: DC

## 2021-06-12 MED ORDER — OMNIPOD 5 DEXG7G6 INTRO GEN 5 KIT: PACK | 0 refills | Status: AC

## 2021-06-12 NOTE — Progress Notes (Signed)
? ?                                                             ?     06/12/2021, 3:50 PM ?    ?        ?Endocrinology follow-up note ? ? ?Subjective:  ? ? Patient ID: Jesse Rodgers, male    DOB: 1975-10-28.  ?Jesse Rodgers is being seen in follow-up for management of currently uncontrolled symptomatic diabetes requested by  Cory Munch, PA-C. ? ? ?Past Medical History:  ?Diagnosis Date  ? Arthritis   ? back   ? Depression   ? takes Zoloft daily  ? Diabetes mellitus without complication ([place])   ? takes Toujeo and Novolog nightly;average fasting blood sugar runs 150  ? History of kidney stones   ? Hyperlipidemia   ? takes Simvastatin daily  ? Hypertension   ? takes Losartan daily  ? ?Past Surgical History:  ?Procedure Laterality Date  ? ABDOMINAL SURGERY    ? APPENDECTOMY    ? BACK SURGERY    ? DENTAL SURGERY  12/10/2014  ? molar tooth #  [id number]  bone biopsy  of ramus  ? PARTIAL COLECTOMY    ? TOOTH EXTRACTION N/A 12/10/2014  ? Procedure: EXTRACTION MOLARS #14,15,16  INTRAORAL AND EXTRAORAL INCISION AND DRAINAGE AND BONE BIOPSY OF LEFT RAMUS;  Surgeon: Tamela Oddi, DDS;  Location: [facility];  Service: Oral Surgery;  Laterality: N/A;  ? ?Social History  ? ?Socioeconomic History  ? Marital status: Married  ?  Spouse name: Not on file  ? Number of children: Not on file  ? Years of education: Not on file  ? Highest education level: Not on file  ?Occupational History  ? Not on file  ?Tobacco Use  ? Smoking status: Former  ?  Packs/day: 1.00  ?  Years: 15.00  ?  Pack years: 15.00  ?  Types: Cigarettes  ?  Quit date: 02/08/2019  ?  Years since quitting: 2.3  ? Smokeless tobacco: Never  ?Vaping Use  ? Vaping Use: Never used  ?Substance and Sexual Activity  ? Alcohol use: Yes  ?  Comment: occasional  ? Drug use: No  ? Sexual activity: Yes  ?Other Topics Concern  ? Not on file  ?Social History Narrative  ? Not on file  ? ?Social Determinants of Health  ? ?Financial Resource Strain: Not on file   ?Food Insecurity: Not on file  ?Transportation Needs: Not on file  ?Physical Activity: Not on file  ?Stress: Not on file  ?Social Connections: Not on file  ? ?Outpatient Encounter Medications as of 06/12/2021  ?Medication Sig  ? Continuous Blood Gluc Receiver (DEXCOM G6 RECEIVER) DEVI Use to check glucose four times daily as directed.  ? Continuous Blood Gluc Sensor (DEXCOM G6 SENSOR) MISC Apply new sensor every 10 days as directed.  ? Continuous Blood Gluc Transmit (DEXCOM G6 TRANSMITTER) MISC USE TO TEST BLOOD GLUCOSE 4-5 TIMES DAILY  ? furosemide (LASIX) 20 MG tablet Take 20 mg by mouth daily as needed.  ? glucagon (GLUCAGON EMERGENCY) 1 MG injection Inject 1 mg into the vein once as needed.  ? GLUCOSAMINE HCL PO Take by mouth daily.  ? glucose blood (FREESTYLE TEST STRIPS) test strip Use  as instructed to monitor glucose 4 times daily, before breakfast and before bed.  ? Insulin Aspart FlexPen (NOVOLOG) 100 UNIT/ML Inject 8-14 Units into the skin 3 (three) times daily before meals.  ? insulin degludec (TRESIBA FLEXTOUCH) 100 UNIT/ML FlexTouch Pen Inject 58 Units into the skin at bedtime.  ? Insulin Disposable Pump (OMNIPOD 5 G6 INTRO, GEN 5,) KIT Change pod every 48-72 hrs  ? Insulin Disposable Pump (OMNIPOD 5 G6 POD, GEN 5,) MISC Change pod every 48-72 hrs  ? Multiple Vitamin (MULTIVITAMIN WITH MINERALS) TABS tablet Take 1 tablet by mouth daily.  ? NON FORMULARY Taking Total Beet product daily  ? Omega-3 Fatty Acids (OMEGA 3 PO) Take by mouth daily.  ? rosuvastatin (CRESTOR) 20 MG tablet Take 1 tablet (20 mg total) by mouth daily.  ? sertraline (ZOLOFT) 100 MG tablet Take 100 mg by mouth daily.  ? [DISCONTINUED] losartan (COZAAR) 25 MG tablet Take 25 mg by mouth daily.  ? losartan (COZAAR) 25 MG tablet Take 1 tablet (25 mg total) by mouth daily.  ? ?No facility-administered encounter medications on file as of 06/12/2021.  ? ? ?ALLERGIES: ?No Known Allergies ? ?VACCINATION STATUS: ?Immunization History   ?Administered Date(s) Administered  ? Influenza-Unspecified 11/23/2014  ? ? ?Diabetes ?He presents for his follow-up diabetic visit. He has type 1 diabetes mellitus. Onset time: He was diagnosed at approximate age of 43 years. His disease course has been stable. There are no hypoglycemic associated symptoms. Pertinent negatives for hypoglycemia include no confusion, headaches, pallor or seizures. Pertinent negatives for diabetes include no chest pain, no fatigue, no polydipsia, no polyphagia, no polyuria and no weakness. There are no hypoglycemic complications. Symptoms are stable. There are no diabetic complications. Risk factors for coronary artery disease include dyslipidemia, diabetes mellitus, hypertension, family history, male sex, tobacco exposure and sedentary lifestyle. Current diabetic treatment includes intensive insulin program. He is compliant with treatment most of the time. His weight is increasing steadily. He is following a generally unhealthy diet. When asked about meal planning, he reported none. He has not had a previous visit with a dietitian. He never participates in exercise. His home blood glucose trend is fluctuating minimally. His overall blood glucose range is 180-200 mg/dl. (He presents today with his CGM showing slightly above target glycemic profile overall.  His POCT A1c today is 6.9%, increasing some from last visit of 6.1%.  He reports he has had a crazy appetite recently, wanting to eat whatever he can get his hands on.  He did have one episode of hypoglycemia between visits where he needed his glucagon pen to bring back up.  Analysis of his CGM shows TIR 41%, TAR 58%, TBR < 1% with a GMI of 8.1%.) An ACE inhibitor/angiotensin II receptor blocker is being taken. He sees a podiatrist.Eye exam is current.  ?Hyperlipidemia ?This is a chronic problem. The current episode started more than 1 year ago. The problem is uncontrolled. Recent lipid tests were reviewed and are variable.  Exacerbating diseases include diabetes and obesity. Factors aggravating his hyperlipidemia include fatty foods. Pertinent negatives include no chest pain, myalgias or shortness of breath. Current antihyperlipidemic treatment includes statins. The current treatment provides mild improvement of lipids. Compliance problems include adherence to diet and adherence to exercise.  Risk factors for coronary artery disease include diabetes mellitus, dyslipidemia, hypertension, male sex, a sedentary lifestyle and obesity.  ?Hypertension ?This is a chronic problem. The current episode started more than 1 year ago. The problem has been gradually improving  since onset. The problem is controlled. Pertinent negatives include no chest pain, headaches, neck pain, palpitations or shortness of breath. There are no associated agents to hypertension. Risk factors for coronary artery disease include diabetes mellitus, dyslipidemia, male gender, smoking/tobacco exposure, sedentary lifestyle and obesity. Past treatments include ACE inhibitors and diuretics. The current treatment provides mild improvement. Compliance problems include exercise and diet.   ? ?Review of systems ? ?Constitutional: + steadily increasing body weight,  current Body mass index is 29.2 kg/m?. , + fatigue, no subjective hyperthermia, no subjective hypothermia ?Eyes: no blurry vision, no xerophthalmia ?ENT: no sore throat, no nodules palpated in throat, no dysphagia/odynophagia, no hoarseness ?Cardiovascular: no chest pain, no shortness of breath, no palpitations, no leg swelling ?Respiratory: no cough, no shortness of breath ?Gastrointestinal: no nausea/vomiting/diarrhea ?Musculoskeletal: no muscle/joint aches, generalized joint swelling ?Skin: no rashes, no hyperemia ?Neurological: no tremors, no numbness, no tingling, no dizziness ?Psychiatric: no depression, no anxiety ? ? ?Objective:  ?  ?BP (!) 147/81   Pulse 87   Ht '6\' 3"'  (1.905 m)   Wt 233 lb 9.6 oz (106 kg)    SpO2 99%   BMI 29.20 kg/m?   ?Wt Readings from Last 3 Encounters:  ?06/12/21 233 lb 9.6 oz (106 kg)  ?02/11/21 230 lb 6.4 oz (104.5 kg)  ?11/11/20 223 lb (101.2 kg)  ?  ?BP Readings from Last 3 Encounters:  ?06/12/21 (

## 2021-06-23 ENCOUNTER — Telehealth: Payer: Self-pay

## 2021-06-23 NOTE — Telephone Encounter (Signed)
Received a fax from [employer] that the prior authorization for his Omnipod 5 G6 (Gen 5) has been approved from 06/20/2021 through 06/21/2022. ?Case Number: [id number] ?ID Number: ?[id number] ?

## 2021-07-16 ENCOUNTER — Encounter: Payer: Self-pay | Admitting: Nurse Practitioner

## 2021-07-16 DIAGNOSIS — E1065 Type 1 diabetes mellitus with hyperglycemia: Secondary | ICD-10-CM

## 2021-07-16 MED ORDER — INSULIN ASPART 100 UNIT/ML IJ SOLN: INTRAMUSCULAR | 3 refills | Status: DC

## 2021-07-16 MED ORDER — DEXCOM G6 SENSOR MISC: 3 refills | Status: DC

## 2021-07-25 DIAGNOSIS — L089 Local infection of the skin and subcutaneous tissue, unspecified: Secondary | ICD-10-CM | POA: Diagnosis not present

## 2021-07-25 DIAGNOSIS — E119 Type 2 diabetes mellitus without complications: Secondary | ICD-10-CM | POA: Diagnosis not present

## 2021-08-04 DIAGNOSIS — L270 Generalized skin eruption due to drugs and medicaments taken internally: Secondary | ICD-10-CM | POA: Diagnosis not present

## 2021-08-05 ENCOUNTER — Other Ambulatory Visit: Payer: Self-pay

## 2021-08-05 ENCOUNTER — Emergency Department (HOSPITAL_COMMUNITY): Payer: BC Managed Care – PPO

## 2021-08-05 ENCOUNTER — Observation Stay (HOSPITAL_COMMUNITY): Payer: BC Managed Care – PPO

## 2021-08-05 ENCOUNTER — Encounter (HOSPITAL_COMMUNITY): Payer: Self-pay | Admitting: *Deleted

## 2021-08-05 ENCOUNTER — Inpatient Hospital Stay (HOSPITAL_COMMUNITY)
Admission: EM | Admit: 2021-08-05 | Discharge: 2021-08-07 | DRG: 311 | Disposition: A | Discharge status: Home / Self Care | Payer: BC Managed Care – PPO | Attending: Internal Medicine | Admitting: Internal Medicine

## 2021-08-05 DIAGNOSIS — M542 Cervicalgia: Secondary | ICD-10-CM | POA: Diagnosis not present

## 2021-08-05 DIAGNOSIS — F17210 Nicotine dependence, cigarettes, uncomplicated: Secondary | ICD-10-CM | POA: Diagnosis present

## 2021-08-05 DIAGNOSIS — R000 Tachycardia, unspecified: Secondary | ICD-10-CM | POA: Diagnosis not present

## 2021-08-05 DIAGNOSIS — I248 Other forms of acute ischemic heart disease: Principal | ICD-10-CM | POA: Diagnosis present

## 2021-08-05 DIAGNOSIS — F32A Depression, unspecified: Secondary | ICD-10-CM | POA: Diagnosis not present

## 2021-08-05 DIAGNOSIS — E109 Type 1 diabetes mellitus without complications: Secondary | ICD-10-CM | POA: Diagnosis not present

## 2021-08-05 DIAGNOSIS — R29818 Other symptoms and signs involving the nervous system: Secondary | ICD-10-CM | POA: Diagnosis not present

## 2021-08-05 DIAGNOSIS — R7989 Other specified abnormal findings of blood chemistry: Secondary | ICD-10-CM | POA: Diagnosis present

## 2021-08-05 DIAGNOSIS — Z8249 Family history of ischemic heart disease and other diseases of the circulatory system: Secondary | ICD-10-CM

## 2021-08-05 DIAGNOSIS — F1010 Alcohol abuse, uncomplicated: Secondary | ICD-10-CM | POA: Diagnosis present

## 2021-08-05 DIAGNOSIS — I10 Essential (primary) hypertension: Secondary | ICD-10-CM | POA: Diagnosis not present

## 2021-08-05 DIAGNOSIS — E1169 Type 2 diabetes mellitus with other specified complication: Secondary | ICD-10-CM

## 2021-08-05 DIAGNOSIS — H532 Diplopia: Secondary | ICD-10-CM | POA: Diagnosis not present

## 2021-08-05 DIAGNOSIS — Z79899 Other long term (current) drug therapy: Secondary | ICD-10-CM

## 2021-08-05 DIAGNOSIS — W57XXXA Bitten or stung by nonvenomous insect and other nonvenomous arthropods, initial encounter: Secondary | ICD-10-CM | POA: Diagnosis present

## 2021-08-05 DIAGNOSIS — Z9641 Presence of insulin pump (external) (internal): Secondary | ICD-10-CM | POA: Diagnosis not present

## 2021-08-05 DIAGNOSIS — R21 Rash and other nonspecific skin eruption: Secondary | ICD-10-CM | POA: Diagnosis present

## 2021-08-05 DIAGNOSIS — I214 Non-ST elevation (NSTEMI) myocardial infarction: Principal | ICD-10-CM

## 2021-08-05 DIAGNOSIS — E1069 Type 1 diabetes mellitus with other specified complication: Secondary | ICD-10-CM | POA: Diagnosis not present

## 2021-08-05 DIAGNOSIS — T148XXA Other injury of unspecified body region, initial encounter: Secondary | ICD-10-CM | POA: Diagnosis present

## 2021-08-05 DIAGNOSIS — E782 Mixed hyperlipidemia: Secondary | ICD-10-CM | POA: Diagnosis not present

## 2021-08-05 DIAGNOSIS — R519 Headache, unspecified: Secondary | ICD-10-CM | POA: Diagnosis present

## 2021-08-05 DIAGNOSIS — R079 Chest pain, unspecified: Secondary | ICD-10-CM | POA: Diagnosis not present

## 2021-08-05 DIAGNOSIS — R778 Other specified abnormalities of plasma proteins: Secondary | ICD-10-CM | POA: Diagnosis not present

## 2021-08-05 DIAGNOSIS — Z794 Long term (current) use of insulin: Secondary | ICD-10-CM | POA: Diagnosis not present

## 2021-08-05 DIAGNOSIS — E785 Hyperlipidemia, unspecified: Secondary | ICD-10-CM

## 2021-08-05 DIAGNOSIS — L03221 Cellulitis of neck: Secondary | ICD-10-CM | POA: Diagnosis not present

## 2021-08-05 DIAGNOSIS — F17200 Nicotine dependence, unspecified, uncomplicated: Secondary | ICD-10-CM | POA: Diagnosis present

## 2021-08-05 DIAGNOSIS — Z720 Tobacco use: Secondary | ICD-10-CM | POA: Diagnosis present

## 2021-08-05 LAB — CBC WITH DIFFERENTIAL/PLATELET
Abs Immature Granulocytes: 0.04 10*3/uL (ref 0.00–0.07)
Basophils Absolute: 0 10*3/uL (ref 0.0–0.1)
Basophils Relative: 0 %
Eosinophils Absolute: 0.1 10*3/uL (ref 0.0–0.5)
Eosinophils Relative: 1 %
HCT: 37.3 % — ABNORMAL LOW (ref 39.0–52.0)
Hemoglobin: 12.2 g/dL — ABNORMAL LOW (ref 13.0–17.0)
Immature Granulocytes: 0 %
Lymphocytes Relative: 10 %
Lymphs Abs: 1.1 10*3/uL (ref 0.7–4.0)
MCH: 29.7 pg (ref 26.0–34.0)
MCHC: 32.7 g/dL (ref 30.0–36.0)
MCV: 90.8 fL (ref 80.0–100.0)
Monocytes Absolute: 0.6 10*3/uL (ref 0.1–1.0)
Monocytes Relative: 6 %
Neutro Abs: 9 10*3/uL — ABNORMAL HIGH (ref 1.7–7.7)
Neutrophils Relative %: 83 %
Platelets: 261 10*3/uL (ref 150–400)
RBC: 4.11 MIL/uL — ABNORMAL LOW (ref 4.22–5.81)
RDW: 12.1 % (ref 11.5–15.5)
WBC: 10.9 10*3/uL — ABNORMAL HIGH (ref 4.0–10.5)
nRBC: 0 % (ref 0.0–0.2)

## 2021-08-05 LAB — COMPREHENSIVE METABOLIC PANEL
ALT: 66 U/L — ABNORMAL HIGH (ref 0–44)
AST: 57 U/L — ABNORMAL HIGH (ref 15–41)
Albumin: 3.6 g/dL (ref 3.5–5.0)
Alkaline Phosphatase: 131 U/L — ABNORMAL HIGH (ref 38–126)
Anion gap: 11 (ref 5–15)
BUN: 17 mg/dL (ref 6–20)
CO2: 25 mmol/L (ref 22–32)
Calcium: 9 mg/dL (ref 8.9–10.3)
Chloride: 100 mmol/L (ref 98–111)
Creatinine, Ser: 1.05 mg/dL (ref 0.61–1.24)
GFR, Estimated: >60 mL/min (ref >60)
Glucose, Bld: 182 mg/dL — ABNORMAL HIGH (ref 70–99)
Potassium: 4.6 mmol/L (ref 3.5–5.1)
Sodium: 136 mmol/L (ref 135–145)
Total Bilirubin: 1.4 mg/dL — ABNORMAL HIGH (ref 0.3–1.2)
Total Protein: 7.7 g/dL (ref 6.5–8.1)

## 2021-08-05 LAB — HEPARIN LEVEL (UNFRACTIONATED): Heparin Unfractionated: 0.12 IU/mL — ABNORMAL LOW (ref 0.30–0.70)

## 2021-08-05 LAB — GLUCOSE, CAPILLARY
Glucose-Capillary: 120 mg/dL — ABNORMAL HIGH (ref 70–99)
Glucose-Capillary: 318 mg/dL — ABNORMAL HIGH (ref 70–99)

## 2021-08-05 LAB — BLOOD GAS, VENOUS
Acid-Base Excess: 3.7 mmol/L — ABNORMAL HIGH (ref 0.0–2.0)
Bicarbonate: 28.5 mmol/L — ABNORMAL HIGH (ref 20.0–28.0)
Drawn by: 6000
O2 Saturation: 26.3 %
Patient temperature: 37.7
pCO2, Ven: 44 mmHg (ref 44–60)
pH, Ven: 7.42 (ref 7.25–7.43)
pO2, Ven: <31 mmHg — CL (ref 32–45)

## 2021-08-05 LAB — HEMOGLOBIN A1C
Hgb A1c MFr Bld: 7 % — ABNORMAL HIGH (ref 4.8–5.6)
Mean Plasma Glucose: 154.2 mg/dL

## 2021-08-05 LAB — HIV ANTIBODY (ROUTINE TESTING W REFLEX): HIV Screen 4th Generation wRfx: NONREACTIVE

## 2021-08-05 LAB — TROPONIN I (HIGH SENSITIVITY)
Troponin I (High Sensitivity): 298 ng/L (ref <18)
Troponin I (High Sensitivity): 271 ng/L (ref <18)

## 2021-08-05 LAB — ETHANOL: Alcohol, Ethyl (B): <10 mg/dL (ref <10)

## 2021-08-05 LAB — LACTIC ACID, PLASMA
Lactic Acid, Venous: 0.8 mmol/L (ref 0.5–1.9)
Lactic Acid, Venous: 1 mmol/L (ref 0.5–1.9)

## 2021-08-05 MED ORDER — FOLIC ACID 1 MG PO TABS
1.0000 mg | ORAL_TABLET | Freq: Every day | ORAL | Status: DC
  Administered 2021-08-05 – 2021-08-07 (×3): 1 mg via ORAL
  Filled 2021-08-05 (×3): qty 1

## 2021-08-05 MED ORDER — THIAMINE HCL 100 MG/ML IJ SOLN: 100.0000 mg | Freq: Every day | INTRAMUSCULAR | Status: DC

## 2021-08-05 MED ORDER — METOCLOPRAMIDE HCL 5 MG/ML IJ SOLN
10.0000 mg | Freq: Once | INTRAMUSCULAR | Status: AC
  Administered 2021-08-05: 10 mg via INTRAVENOUS
  Filled 2021-08-05: qty 2

## 2021-08-05 MED ORDER — LORAZEPAM 2 MG/ML IJ SOLN: 1.0000 mg | INTRAMUSCULAR | Status: DC | PRN

## 2021-08-05 MED ORDER — DIPHENHYDRAMINE HCL 50 MG/ML IJ SOLN
25.0000 mg | Freq: Once | INTRAMUSCULAR | Status: AC
Start: 2021-08-05 — End: 2021-08-05
  Administered 2021-08-05: 25 mg via INTRAVENOUS
  Filled 2021-08-05: qty 1

## 2021-08-05 MED ORDER — ACETAMINOPHEN 325 MG PO TABS
650.0000 mg | ORAL_TABLET | Freq: Four times a day (QID) | ORAL | Status: DC | PRN
  Administered 2021-08-05 – 2021-08-06 (×3): 650 mg via ORAL
  Filled 2021-08-05 (×3): qty 2

## 2021-08-05 MED ORDER — HEPARIN BOLUS VIA INFUSION
4000.0000 [IU] | Freq: Once | INTRAVENOUS | Status: AC
  Administered 2021-08-05: 4000 [IU] via INTRAVENOUS

## 2021-08-05 MED ORDER — BISACODYL 10 MG RE SUPP: 10.0000 mg | Freq: Every day | RECTAL | Status: DC | PRN

## 2021-08-05 MED ORDER — ASPIRIN 81 MG PO CHEW
324.0000 mg | CHEWABLE_TABLET | Freq: Once | ORAL | Status: AC
  Administered 2021-08-05: 324 mg via ORAL
  Filled 2021-08-05: qty 4

## 2021-08-05 MED ORDER — LORAZEPAM 1 MG PO TABS
1.0000 mg | ORAL_TABLET | ORAL | Status: DC | PRN
  Administered 2021-08-06: 1 mg via ORAL
  Filled 2021-08-05: qty 1

## 2021-08-05 MED ORDER — DIPHENHYDRAMINE HCL 25 MG PO CAPS
25.0000 mg | ORAL_CAPSULE | Freq: Every day | ORAL | Status: DC
  Administered 2021-08-05 – 2021-08-06 (×2): 25 mg via ORAL
  Filled 2021-08-05 (×2): qty 1

## 2021-08-05 MED ORDER — ONDANSETRON HCL 4 MG PO TABS: 4.0000 mg | ORAL_TABLET | Freq: Four times a day (QID) | ORAL | Status: DC | PRN

## 2021-08-05 MED ORDER — TRAMADOL HCL 50 MG PO TABS
50.0000 mg | ORAL_TABLET | Freq: Once | ORAL | Status: AC
  Administered 2021-08-05: 50 mg via ORAL
  Filled 2021-08-05: qty 1

## 2021-08-05 MED ORDER — KETOROLAC TROMETHAMINE 30 MG/ML IJ SOLN
30.0000 mg | Freq: Once | INTRAMUSCULAR | Status: AC
  Administered 2021-08-05: 30 mg via INTRAVENOUS
  Filled 2021-08-05: qty 1

## 2021-08-05 MED ORDER — HEPARIN BOLUS VIA INFUSION
2000.0000 [IU] | Freq: Once | INTRAVENOUS | Status: AC
Start: 2021-08-06 — End: 2021-08-06
  Administered 2021-08-06: 2000 [IU] via INTRAVENOUS
  Filled 2021-08-05: qty 2000

## 2021-08-05 MED ORDER — SODIUM CHLORIDE 0.9 % IV SOLN: 250.0000 mL | INTRAVENOUS | Status: DC | PRN

## 2021-08-05 MED ORDER — ASPIRIN 81 MG PO TBEC
81.0000 mg | DELAYED_RELEASE_TABLET | Freq: Every day | ORAL | Status: DC
  Administered 2021-08-06 – 2021-08-07 (×2): 81 mg via ORAL
  Filled 2021-08-05 (×2): qty 1

## 2021-08-05 MED ORDER — INSULIN ASPART 100 UNIT/ML IJ SOLN: 4.0000 [IU] | Freq: Three times a day (TID) | INTRAMUSCULAR | Status: DC

## 2021-08-05 MED ORDER — FAMOTIDINE 20 MG PO TABS
20.0000 mg | ORAL_TABLET | Freq: Two times a day (BID) | ORAL | Status: DC
Start: 2021-08-05 — End: 2021-08-07
  Administered 2021-08-05 – 2021-08-07 (×4): 20 mg via ORAL
  Filled 2021-08-05 (×4): qty 1

## 2021-08-05 MED ORDER — SODIUM CHLORIDE 0.9% FLUSH: 3.0000 mL | INTRAVENOUS | Status: DC | PRN

## 2021-08-05 MED ORDER — MORPHINE SULFATE (PF) 4 MG/ML IV SOLN
4.0000 mg | Freq: Once | INTRAVENOUS | Status: AC
  Administered 2021-08-05: 4 mg via INTRAVENOUS
  Filled 2021-08-05: qty 1

## 2021-08-05 MED ORDER — ONDANSETRON HCL 4 MG/2ML IJ SOLN: 4.0000 mg | Freq: Four times a day (QID) | INTRAMUSCULAR | Status: DC | PRN

## 2021-08-05 MED ORDER — INSULIN ASPART 100 UNIT/ML IJ SOLN: 0.0000 [IU] | Freq: Three times a day (TID) | INTRAMUSCULAR | Status: DC

## 2021-08-05 MED ORDER — SERTRALINE HCL 50 MG PO TABS
100.0000 mg | ORAL_TABLET | Freq: Every day | ORAL | Status: DC
  Administered 2021-08-05 – 2021-08-07 (×3): 100 mg via ORAL
  Filled 2021-08-05 (×3): qty 2

## 2021-08-05 MED ORDER — INSULIN ASPART 100 UNIT/ML IJ SOLN: 0.0000 [IU] | Freq: Every day | INTRAMUSCULAR | Status: DC

## 2021-08-05 MED ORDER — SODIUM CHLORIDE 0.9 % IV BOLUS
1000.0000 mL | Freq: Once | INTRAVENOUS | Status: AC
  Administered 2021-08-05: 1000 mL via INTRAVENOUS

## 2021-08-05 MED ORDER — ADULT MULTIVITAMIN W/MINERALS CH
1.0000 | ORAL_TABLET | Freq: Every day | ORAL | Status: DC
Start: 2021-08-05 — End: 2021-08-07
  Administered 2021-08-05 – 2021-08-07 (×3): 1 via ORAL
  Filled 2021-08-05 (×3): qty 1

## 2021-08-05 MED ORDER — POLYETHYLENE GLYCOL 3350 17 G PO PACK: 17.0000 g | PACK | Freq: Every day | ORAL | Status: DC | PRN

## 2021-08-05 MED ORDER — METOPROLOL TARTRATE 5 MG/5ML IV SOLN
5.0000 mg | Freq: Once | INTRAVENOUS | Status: AC
Start: 2021-08-05 — End: 2021-08-05
  Administered 2021-08-05: 5 mg via INTRAVENOUS
  Filled 2021-08-05: qty 5

## 2021-08-05 MED ORDER — ACETAMINOPHEN 650 MG RE SUPP: 650.0000 mg | Freq: Four times a day (QID) | RECTAL | Status: DC | PRN

## 2021-08-05 MED ORDER — INSULIN GLARGINE-YFGN 100 UNIT/ML ~~LOC~~ SOLN: 40.0000 [IU] | Freq: Every day | SUBCUTANEOUS | Status: DC

## 2021-08-05 MED ORDER — VANCOMYCIN HCL IN DEXTROSE 1-5 GM/200ML-% IV SOLN
1000.0000 mg | Freq: Once | INTRAVENOUS | Status: AC
  Administered 2021-08-05: 1000 mg via INTRAVENOUS
  Filled 2021-08-05: qty 200

## 2021-08-05 MED ORDER — HEPARIN (PORCINE) 25000 UT/250ML-% IV SOLN
2400.0000 [IU]/h | INTRAVENOUS | Status: DC
  Administered 2021-08-05: 1300 [IU]/h via INTRAVENOUS
  Administered 2021-08-06: 1500 [IU]/h via INTRAVENOUS
  Administered 2021-08-07: 2400 [IU]/h via INTRAVENOUS
  Filled 2021-08-05 (×4): qty 250

## 2021-08-05 MED ORDER — METOPROLOL TARTRATE 50 MG PO TABS
50.0000 mg | ORAL_TABLET | Freq: Two times a day (BID) | ORAL | Status: DC
  Administered 2021-08-05 – 2021-08-07 (×5): 50 mg via ORAL
  Filled 2021-08-05 (×5): qty 1

## 2021-08-05 MED ORDER — ROSUVASTATIN CALCIUM 20 MG PO TABS
20.0000 mg | ORAL_TABLET | Freq: Every day | ORAL | Status: DC
  Administered 2021-08-05 – 2021-08-07 (×3): 20 mg via ORAL
  Filled 2021-08-05 (×3): qty 1

## 2021-08-05 MED ORDER — SODIUM CHLORIDE 0.9% FLUSH
3.0000 mL | Freq: Two times a day (BID) | INTRAVENOUS | Status: DC
  Administered 2021-08-05 – 2021-08-06 (×4): 3 mL via INTRAVENOUS

## 2021-08-05 MED ORDER — THIAMINE HCL 100 MG PO TABS
100.0000 mg | ORAL_TABLET | Freq: Every day | ORAL | Status: DC
  Administered 2021-08-05 – 2021-08-07 (×3): 100 mg via ORAL
  Filled 2021-08-05 (×3): qty 1

## 2021-08-05 MED ORDER — SODIUM CHLORIDE 0.9% FLUSH
3.0000 mL | Freq: Two times a day (BID) | INTRAVENOUS | Status: DC
  Administered 2021-08-05 – 2021-08-07 (×5): 3 mL via INTRAVENOUS

## 2021-08-05 NOTE — ED Provider Notes (Signed)
[facility] Provider Note   CSN: [id number] Arrival date & time: 08/05/21  1038     History  Chief Complaint  Patient presents with   Chest Pain    Jesse Rodgers is a 46 y.o. male.  HPI Patient presents ostensibly due to blisters on the back of his neck, but after the initial description of this he describes chest pain since yesterday.  No history of cardiac disease.  He does have a history of multiple other medical problems including insulin-dependent diabetes.  He describes pressure since yesterday throughout the anterior chest, no dyspnea, no syncope.  He notes the neck lesions began a few days prior, have been growing, spreading down the posterior neck and his arms.    Home Medications Prior to Admission medications   Medication Sig Start Date End Date Taking? Authorizing Provider  furosemide (LASIX) 20 MG tablet Take 20 mg by mouth daily as needed. 06/09/19  Yes [provider]  glucagon (GLUCAGON EMERGENCY) 1 MG injection Inject 1 mg into the vein once as needed. 09/29/18  Yes Cassandria Anger, MD  insulin aspart (NOVOLOG) 100 UNIT/ML injection Use with Omnipod for TDD around 80 units daily 07/16/21  Yes Reardon, Juanetta Beets, NP  Insulin Disposable Pump (OMNIPOD 5 G6 POD, GEN 5,) MISC Change pod every 48-72 hrs 06/12/21  Yes Reardon, Juanetta Beets, NP  losartan (COZAAR) 25 MG tablet Take 1 tablet (25 mg total) by mouth daily. 06/12/21  Yes Brita Romp, NP  Multiple Vitamin (MULTIVITAMIN WITH MINERALS) TABS tablet Take 1 tablet by mouth daily.   Yes [provider]  NON FORMULARY Taking Total Beet product daily   Yes [provider]  rosuvastatin (CRESTOR) 20 MG tablet Take 1 tablet (20 mg total) by mouth daily. 11/11/20  Yes Brita Romp, NP  sertraline (ZOLOFT) 100 MG tablet Take 100 mg by mouth daily. 08/09/19  Yes [provider]  Continuous Blood Gluc Receiver (DEXCOM G6 RECEIVER) DEVI Use to check glucose four  times daily as directed. 07/12/20   Brita Romp, NP  Continuous Blood Gluc Sensor (DEXCOM G6 SENSOR) MISC Apply new sensor every 10 days as directed. 07/16/21   Brita Romp, NP  Continuous Blood Gluc Transmit (DEXCOM G6 TRANSMITTER) MISC USE TO TEST BLOOD GLUCOSE 4-5 TIMES DAILY 04/28/21   Brita Romp, NP  glucose blood (FREESTYLE TEST STRIPS) test strip Use as instructed to monitor glucose 4 times daily, before breakfast and before bed. 04/09/20   Brita Romp, NP  insulin degludec (TRESIBA FLEXTOUCH) 100 UNIT/ML FlexTouch Pen Inject 58 Units into the skin at bedtime. Patient not taking: Reported on 08/05/2021 04/28/21   Brita Romp, NP  Insulin Disposable Pump (OMNIPOD 5 G6 INTRO, GEN 5,) KIT Change pod every 48-72 hrs 06/12/21   Brita Romp, NP      Allergies    Patient has no known allergies.    Review of Systems   Review of Systems  All other systems reviewed and are negative.  Physical Exam Updated Vital Signs BP 130/74   Pulse (!) 116   Temp 99.9 F (37.7 C) (Oral)   Resp (!) 25   Ht '6\' 3"'  (1.905 m)   Wt 104.3 kg   SpO2 94%   BMI 28.75 kg/m  Physical Exam Vitals and nursing note reviewed.  Constitutional:      General: He is not in acute distress.    Appearance: He is well-developed.  HENT:  Head: Normocephalic and atraumatic.  Eyes:     Conjunctiva/sclera: Conjunctivae normal.  Cardiovascular:     Rate and Rhythm: Normal rate and regular rhythm.  Pulmonary:     Effort: Pulmonary effort is normal. No respiratory distress.     Breath sounds: No stridor.  Abdominal:     General: There is no distension.  Skin:    General: Skin is warm and dry.     Comments: Slightly raised lesions across the posterior neck, and proximal lateral arms bilaterally.  No confluent erythema.  Neurological:     Mental Status: He is alert and oriented to person, place, and time.    ED Results / Procedures / Treatments   Labs (all labs ordered are  listed, but only abnormal results are displayed) Labs Reviewed  COMPREHENSIVE METABOLIC PANEL - Abnormal; Notable for the following components:      Result Value   Glucose, Bld 182 (*)    AST 57 (*)    ALT 66 (*)    Alkaline Phosphatase 131 (*)    Total Bilirubin 1.4 (*)    All other components within normal limits  CBC WITH DIFFERENTIAL/PLATELET - Abnormal; Notable for the following components:   WBC 10.9 (*)    RBC 4.11 (*)    Hemoglobin 12.2 (*)    HCT 37.3 (*)    Neutro Abs 9.0 (*)    All other components within normal limits  BLOOD GAS, VENOUS - Abnormal; Notable for the following components:   pO2, Ven <31 (*)    Bicarbonate 28.5 (*)    Acid-Base Excess 3.7 (*)    All other components within normal limits  TROPONIN I (HIGH SENSITIVITY) - Abnormal; Notable for the following components:   Troponin I (High Sensitivity) 298 (*)    All other components within normal limits  LACTIC ACID, PLASMA  ETHANOL  LACTIC ACID, PLASMA  URINALYSIS, ROUTINE W REFLEX MICROSCOPIC  HEPARIN LEVEL (UNFRACTIONATED)  TROPONIN I (HIGH SENSITIVITY)    EKG EKG Interpretation  Date/Time:  Tuesday August 05 2021 11:42:36 EDT Ventricular Rate:  118 PR Interval:  154 QRS Duration: 95 QT Interval:  304 QTC Calculation: 426 R Axis:   62 Text Interpretation: Sinus tachycardia ST-t wave abnormality Abnormal ECG Confirmed by Carmin Muskrat [phone]) on 08/05/2021 12:27:50 PM  Radiology DG Chest Port 1 View  Result Date: 08/05/2021 CLINICAL DATA:  Chest pain. EXAM: PORTABLE CHEST 1 VIEW COMPARISON:  None Available. FINDINGS: The heart is mildly enlarged. Both lungs are clear. The visualized skeletal structures are unremarkable. IMPRESSION: No active disease. Electronically Signed   By: Keane Police D.O.   On: 08/05/2021 13:45    Procedures Procedures    Medications Ordered in ED Medications  heparin ADULT infusion 100 units/mL (25000 units/287m) (1,300 Units/hr Intravenous New Bag/Given 08/05/21  1338)  metoprolol tartrate (LOPRESSOR) tablet 50 mg (has no administration in time range)  sodium chloride 0.9 % bolus 1,000 mL (0 mLs Intravenous Stopped 08/05/21 1339)  traMADol (ULTRAM) tablet 50 mg (50 mg Oral Given 08/05/21 1220)  vancomycin (VANCOCIN) IVPB 1000 mg/200 mL premix (0 mg Intravenous Stopped 08/05/21 1321)  morphine (PF) 4 MG/ML injection 4 mg (4 mg Intravenous Given 08/05/21 1324)  heparin bolus via infusion 4,000 Units (4,000 Units Intravenous Bolus from Bag 08/05/21 1338)  aspirin chewable tablet 324 mg (324 mg Oral Given 08/05/21 1343)    ED Course/ Medical Decision Making/ A&P  This patient with a Hx of insulin-dependent diabetes, hypertension presents to the ED for  concern of neck lesions and chest pain, this involves an extensive number of treatment options, and is a complaint that carries with it a high risk of complications and morbidity.    The differential diagnosis includes cellulitis, bacteremia, sepsis, and ACS, pneumonia   Social Determinants of Health:  Multiple medical issues including insulin-dependent diabetes  Additional history obtained:  Additional history and/or information obtained from chart review, notable for ongoing efforts to make his insulin pump work   After the initial evaluation, orders, including: Labs x-ray were initiated.   Patient placed on Cardiac and Pulse-Oximetry Monitors. The patient was maintained on a cardiac monitor.  The cardiac monitored showed an rhythm of sinus tach 110 abnormal The patient was also maintained on pulse oximetry. The readings were typically percent room air normal   On repeat evaluation of the patient stayed the same  Lab Tests:  I personally interpreted labs.  The pertinent results include: Most significantly the patient found to have troponin almost 300  Imaging Studies ordered:  I independently visualized and interpreted imaging which showed unremarkable chest x-ray I agree with the radiologist  interpretation  Consultations Obtained:  I requested consultation with the cardiology team,  and discussed lab and imaging findings as well as pertinent plan - they recommend: Dr. Admission recommends admission, hospitalist team with ongoing cardiology following for patient's chest pain, elevated troponin.  Recommendations pending  Dispostion / Final MDM:  After consideration of the diagnostic results and the patient's response to treatment, this adult male with multiple complaints, primarily, seemingly, neck infection and chest pain is found to have evidence for cellulitis and likely NSTEMI.  Given the latter patient required admission, after consultation with her cardiology colleagues, admission to this facility rather than transfer to our tertiary care center advised.  Patient started on heparin.    Final Clinical Impression(s) / ED Diagnoses Final diagnoses:  NSTEMI (non-ST elevated myocardial infarction) ([place])  Cellulitis of neck   CRITICAL CARE Performed by: Carmin Muskrat Total critical care time: 35 minutes Critical care time was exclusive of separately billable procedures and treating other patients. Critical care was necessary to treat or prevent imminent or life-threatening deterioration. Critical care was time spent personally by me on the following activities: development of treatment plan with patient and/or surrogate as well as nursing, discussions with consultants, evaluation of patient's response to treatment, examination of patient, obtaining history from patient or surrogate, ordering and performing treatments and interventions, ordering and review of laboratory studies, ordering and review of radiographic studies, pulse oximetry and re-evaluation of patient's condition.    Carmin Muskrat, MD 08/05/21 [phone]

## 2021-08-05 NOTE — Progress Notes (Signed)
[facility] for heparin Indication: chest pain/ACS  No Known Allergies  Patient Measurements: Height: '6\' 4"'  (193 cm) Weight: 105.3 kg (232 lb 2.3 oz) IBW/kg (Calculated) : 86.8 Heparin Dosing Weight: 104 kg  Vital Signs: Temp: 98.3 F (36.8 C) (06/06 2030) Temp Source: Oral (06/06 2030) BP: 105/52 (06/06 2030) Pulse Rate: 67 (06/06 2030)  Labs: Recent Labs    08/05/21 1205 08/05/21 1405 08/05/21 2211  HGB 12.2*  --   --   HCT 37.3*  --   --   PLT 261  --   --   HEPARINUNFRC  --   --  0.12*  CREATININE 1.05  --   --   TROPONINIHS 298* 271*  --      Estimated Creatinine Clearance: 114.6 mL/min (by C-G formula based on SCr of 1.05 mg/dL).   Medical History: Past Medical History:  Diagnosis Date   Arthritis    back    Depression    takes Zoloft daily   Diabetes mellitus without complication ([place])    takes Toujeo and Novolog nightly;average fasting blood sugar runs 150   History of kidney stones    Hyperlipidemia    takes Simvastatin daily   Hypertension    takes Losartan daily    Medications:  Medications Prior to Admission  Medication Sig Dispense Refill Last Dose   furosemide (LASIX) 20 MG tablet Take 20 mg by mouth daily as needed.   08/04/2021   glucagon (GLUCAGON EMERGENCY) 1 MG injection Inject 1 mg into the vein once as needed. 1 each 12 unk   insulin aspart (NOVOLOG) 100 UNIT/ML injection Use with Omnipod for TDD around 80 units daily 40 mL 3 08/05/2021   Insulin Disposable Pump (OMNIPOD 5 G6 POD, GEN 5,) MISC Change pod every 48-72 hrs 6 each 6 08/05/2021   losartan (COZAAR) 25 MG tablet Take 1 tablet (25 mg total) by mouth daily. 90 tablet 3 Past Month   Multiple Vitamin (MULTIVITAMIN WITH MINERALS) TABS tablet Take 1 tablet by mouth daily.   08/04/2021   NON FORMULARY Taking Total Beet product daily   08/04/2021   rosuvastatin (CRESTOR) 20 MG tablet Take 1 tablet (20 mg total) by mouth daily. 90 tablet 3 08/04/2021    sertraline (ZOLOFT) 100 MG tablet Take 100 mg by mouth daily.   Past Month   Continuous Blood Gluc Receiver (DEXCOM G6 RECEIVER) DEVI Use to check glucose four times daily as directed. 1 each 0    Continuous Blood Gluc Sensor (DEXCOM G6 SENSOR) MISC Apply new sensor every 10 days as directed. 9 each 3    Continuous Blood Gluc Transmit (DEXCOM G6 TRANSMITTER) MISC USE TO TEST BLOOD GLUCOSE 4-5 TIMES DAILY 1 each 1    glucose blood (FREESTYLE TEST STRIPS) test strip Use as instructed to monitor glucose 4 times daily, before breakfast and before bed. 100 each 12    insulin degludec (TRESIBA FLEXTOUCH) 100 UNIT/ML FlexTouch Pen Inject 58 Units into the skin at bedtime. (Patient not taking: Reported on 08/05/2021) 45 mL 1 Not Taking   Insulin Disposable Pump (OMNIPOD 5 G6 INTRO, GEN 5,) KIT Change pod every 48-72 hrs 1 kit 0     Assessment: Pharmacy consulted to dose heparin in patient with chest pain/ACS.  Patient is not on anticoagulation prior to admission.  CBC WNL Trop 298  6/6 PM update:  Heparin level low  Goal of Therapy:  Heparin level 0.3-0.7 units/ml Monitor platelets by anticoagulation protocol: Yes  Plan:  Heparin 2000 units bolus Inc heparin to 1500 units/hr Re-check heparin level with AM labs  Narda Bonds, PharmD, [place] Pharmacist Phone: [phone]

## 2021-08-05 NOTE — ED Notes (Signed)
This nurse made EDP aware of patients 5/10 chest heaviness.

## 2021-08-05 NOTE — H&P (Addendum)
Patient Demographics:    Jesse Rodgers, is a 46 y.o. male  MRN: [id number]   DOB - 06-23-1975  Admit Date - 08/05/2021  Outpatient Primary MD for the patient is [facility], [place]:   Assessment and Plan  1)Atypical Chest Pain-  Cardiovascular risk factors include male gender, HLD, HTN and DM1, -  Place in Observation status on  telemetry monitored unit, check serial troponins and EKG to rule out acute coronary syndrome .  If patient rules out for ACS, will need further cardiovascular risk stratification . Cardiology consult to help decide if Stress test is needed in am Versus other diagnostic modalities.  -Troponin 298 >> 271, EKG sinus tachycardia with ST and  T wave abnormality BAL < 10   Give aspirin, Crestor, nitroglycerin, and metoprolol -Continue IV heparin -Echocardiogram pending  2)Closed head  and Neck injury- -Chest x-ray, CT head and CT C-spine without acute findings -Neuro exam is reassuring  3)Etoh Abuse--patient drinks about 12 ounces of bourbon on a daily basis, drinks beer couple times a week as well -Lorazepam per CIWA protocol  4) leukocytosis with rash and fevers--- -he did not know he had fevers however he had a temp of 100.6 here -WBC of 10.9, Lactic acid 0.8 -Chest x-ray without acute findings -Recently treated with Bactrim and Keflex for ingrown toenail infection -Doubt frank allergic reaction to antibiotics -Avoid IV steroids given concerns about possible ACS -Give Pepcid and Benadryl for rash -Blood cultures pending  5)Elevated LFTs-- -AST 57, ALT 66 T. bili 1.4, alk phos 131 -PTA patient on statin drug -Monitor closely , no abdominal pain or any other concerns at this time  6)DM1-diagnosed at age 33 -A1c pending -Patient was to continue his  insulin pump while here -Check CBGs -Discussed with pharmacist  7)HTN--- give metoprolol 50 mg twice daily  8)HLD--continue Crestor  9)Depression--- continue Zoloft  10) nicotine dependence--- abstinence from nicotine advised  Disposition/Need for in-Hospital Stay- patient unable to be discharged at this time due to chest pain with the need to rule out ACS  Dispo: The patient is from: Home              Anticipated d/c is to: Home              Anticipated d/c date is: 1 day              Patient currently is not medically stable to d/c. Barriers: Not Clinically Stable-   With History of - Reviewed by me  Past Medical History:  Diagnosis Date   Arthritis    back    Depression    takes Zoloft daily   Diabetes mellitus without complication ([place])    takes Toujeo and Novolog nightly;average fasting blood sugar runs 150   History of kidney stones    Hyperlipidemia    takes Simvastatin daily   Hypertension    takes Losartan daily  Past Surgical History:  Procedure Laterality Date   ABDOMINAL SURGERY     APPENDECTOMY     BACK SURGERY     DENTAL SURGERY  12/10/2014   molar tooth #  [id number]  bone biopsy  of ramus   PARTIAL COLECTOMY     TOOTH EXTRACTION N/A 12/10/2014   Procedure: EXTRACTION MOLARS #14,15,16  INTRAORAL AND EXTRAORAL INCISION AND DRAINAGE AND BONE BIOPSY OF LEFT RAMUS;  Surgeon: Tamela Oddi, DDS;  Location: [place];  Service: Oral Surgery;  Laterality: N/A;    Chief Complaint  Patient presents with   Chest Pain      HPI:    Jesse Rodgers  is a 46 y.o. male with past medical history relevant for type 1 diabetes, HLD, tobacco use, HTN and depressive disorder who presents to the ED with rash on neck and his back, as well as chest discomfort -Patient was apparently working outside in the yard/lawn on 08/02/2021 when a tree limb fell on his head and neck area--on 08/03/2021 started to break out in a rash on his head neck and then upper back  area -Patient states he has had chest pain since 08/04/2021, it is not exertional, no leg swelling or leg pain no pleuritic symptoms, no dyspnea on exertion -Rash is not itchy,   No Nausea, Vomiting or Diarrhea -He had no chills at home- -he did not know he had fevers however he had a temp of 100.6 here -No cough no URI type symptoms No abdominal pain, no dyspnea, no syncope -Patient was recently treated with Bactrim and Keflex from 07/25/21 for infected ingrown nail and of the right big toe -Chest x-ray, CT head and CT C-spine without acute findings -CBC with a WBC of 10.9, hemoglobin 12.2, platelets 261 -Glucose 182, creatinine 1.0 -AST 57, ALT 66 T. bili 1.4, alk phos 131, potassium 4.6 - Lactic acid 0.8  Patient admits to daily alcohol and nicotine use  Troponin 298 >> 271, EKG sinus tachycardia with ST and  T wave abnormality BAL < 10  - Additional history obtained from patient's wife at bedside   Review of systems:    In addition to the HPI above,   A full Review of  Systems was done, all other systems reviewed are negative except as noted above in HPI , .    Social History:  Reviewed by me    Social History   Tobacco Use   Smoking status: Former    Packs/day: 1.00    Years: 15.00    Pack years: 15.00    Types: Cigarettes    Quit date: 02/08/2019    Years since quitting: 2.4   Smokeless tobacco: Never  Substance Use Topics   Alcohol use: Yes    Comment: occasional    Family History :  Reviewed by me  HTN    Home Medications:   Prior to Admission medications   Medication Sig Start Date End Date Taking? Authorizing Provider  furosemide (LASIX) 20 MG tablet Take 20 mg by mouth daily as needed. 06/09/19  Yes [provider]  glucagon (GLUCAGON EMERGENCY) 1 MG injection Inject 1 mg into the vein once as needed. 09/29/18  Yes Cassandria Anger, MD  insulin aspart (NOVOLOG) 100 UNIT/ML injection Use with Omnipod for TDD around 80 units daily 07/16/21   Yes Reardon, Juanetta Beets, NP  Insulin Disposable Pump (OMNIPOD 5 G6 POD, GEN 5,) MISC Change pod every 48-72 hrs 06/12/21  Yes Brita Romp, NP  losartan (  COZAAR) 25 MG tablet Take 1 tablet (25 mg total) by mouth daily. 06/12/21  Yes Brita Romp, NP  Multiple Vitamin (MULTIVITAMIN WITH MINERALS) TABS tablet Take 1 tablet by mouth daily.   Yes [provider]  NON FORMULARY Taking Total Beet product daily   Yes [provider]  rosuvastatin (CRESTOR) 20 MG tablet Take 1 tablet (20 mg total) by mouth daily. 11/11/20  Yes Brita Romp, NP  sertraline (ZOLOFT) 100 MG tablet Take 100 mg by mouth daily. 08/09/19  Yes [provider]  Continuous Blood Gluc Receiver (DEXCOM G6 RECEIVER) DEVI Use to check glucose four times daily as directed. 07/12/20   Brita Romp, NP  Continuous Blood Gluc Sensor (DEXCOM G6 SENSOR) MISC Apply new sensor every 10 days as directed. 07/16/21   Brita Romp, NP  Continuous Blood Gluc Transmit (DEXCOM G6 TRANSMITTER) MISC USE TO TEST BLOOD GLUCOSE 4-5 TIMES DAILY 04/28/21   Brita Romp, NP  glucose blood (FREESTYLE TEST STRIPS) test strip Use as instructed to monitor glucose 4 times daily, before breakfast and before bed. 04/09/20   Brita Romp, NP  insulin degludec (TRESIBA FLEXTOUCH) 100 UNIT/ML FlexTouch Pen Inject 58 Units into the skin at bedtime. Patient not taking: Reported on 08/05/2021 04/28/21   Brita Romp, NP  Insulin Disposable Pump (OMNIPOD 5 G6 INTRO, GEN 5,) KIT Change pod every 48-72 hrs 06/12/21   Brita Romp, NP     Allergies:    No Known Allergies   Physical Exam:   Vitals Blood pressure (!) 144/88, pulse (!) 110, temperature (!) 100.6 F (38.1 C), temperature source Oral, resp. rate 20, height '6\' 4"'  (1.93 m), weight 105.3 kg, SpO2 100 %.  Physical Examination: General appearance - alert,  in no distress  Mental status - alert, oriented to person, place, and time,  Eyes -  sclera anicteric Neck - supple, no JVD elevation , Chest - clear  to auscultation bilaterally, symmetrical air movement,  Heart - S1 and S2 normal, irregular and tachycardic Abdomen - soft, nontender, nondistended, +BS Neurological - screening mental status exam normal, neck supple without rigidity, cranial nerves II through XII intact, DTR's normal and symmetric Extremities - no pedal edema noted, intact peripheral pulses  Skin - warm, dry--rash as in photo below   Media Information Document Information  Photos    08/05/2021 16:59  Attached To:  Hospital Encounter on 08/05/21   Source Information  Roxan Hockey, MD  Ap-Dept 300      Data Review:    CBC Recent Labs  Lab 08/05/21 1205  WBC 10.9*  HGB 12.2*  HCT 37.3*  PLT 261  MCV 90.8  MCH 29.7  MCHC 32.7  RDW 12.1  LYMPHSABS 1.1  MONOABS 0.6  EOSABS 0.1  BASOSABS 0.0   ------------------------------------------------------------------------------------------------------------------  Chemistries  Recent Labs  Lab 08/05/21 1205  NA 136  K 4.6  CL 100  CO2 25  GLUCOSE 182*  BUN 17  CREATININE 1.05  CALCIUM 9.0  AST 57*  ALT 66*  ALKPHOS 131*  BILITOT 1.4*   ------------------------------------------------------------------------------------------------------------------ estimated creatinine clearance is 114.6 mL/min (by C-G formula based on SCr of 1.05 mg/dL). ------------------------------------------------------------------------------------------------------------------ --------------------------------------------------------------------------------------------------------------  Urinalysis    Component Value Date/Time   COLORURINE YELLOW 09/21/2007 1852   APPEARANCEUR CLEAR 09/21/2007 1852   LABSPEC 1.010 09/21/2007 1852   PHURINE 6.5 09/21/2007 1852   GLUCOSEU >1000 (A) 09/21/2007 1852   HGBUR TRACE (A) 09/21/2007 1852   BILIRUBINUR NEGATIVE  09/21/2007 [place]  09/21/2007 1852   PROTEINUR NEGATIVE 09/21/2007 1852   UROBILINOGEN 0.2 09/21/2007 1852   NITRITE NEGATIVE 09/21/2007 1852   LEUKOCYTESUR NEGATIVE 09/21/2007 1852    ----------------------------------------------------------------------------------------------------------------   Imaging Results:    CT HEAD WO CONTRAST (5MM)  Result Date: 08/05/2021 CLINICAL DATA:  Memory and speech issues with neck pain and swelling after tree limb fell on head. EXAM: CT HEAD WITHOUT CONTRAST CT CERVICAL SPINE WITHOUT CONTRAST TECHNIQUE: Multidetector CT imaging of the head and cervical spine was performed following the standard protocol without intravenous contrast. Multiplanar CT image reconstructions of the cervical spine were also generated. RADIATION DOSE REDUCTION: This exam was performed according to the departmental dose-optimization program which includes automated exposure control, adjustment of the mA and/or kV according to patient size and/or use of iterative reconstruction technique. COMPARISON:  None Available. FINDINGS: CT HEAD FINDINGS Brain: There is no evidence for acute hemorrhage, hydrocephalus, mass lesion, or abnormal extra-axial fluid collection. No definite CT evidence for acute infarction. Vascular: No hyperdense vessel or unexpected calcification. Skull: No evidence for fracture. No worrisome lytic or sclerotic lesion. Sinuses/Orbits: Chronic mucosal disease noted ethmoid air cells and right maxillary sinus Visualized portions of the globes and intraorbital fat are unremarkable. Other: None. CT CERVICAL SPINE FINDINGS Alignment: Normal. Skull base and vertebrae: No acute fracture. No primary bone lesion or focal pathologic process. Soft tissues and spinal canal: No prevertebral fluid or swelling. No visible canal hematoma. Disc levels:  Loss of disc height noted C6-7. Upper chest: Unremarkable. Other: None. IMPRESSION: 1. No acute intracranial abnormality. 2. No cervical spine fracture or  subluxation. Electronically Signed   By: Misty Stanley M.D.   On: 08/05/2021 16:02   CT CERVICAL SPINE WO CONTRAST  Result Date: 08/05/2021 CLINICAL DATA:  Memory and speech issues with neck pain and swelling after tree limb fell on head. EXAM: CT HEAD WITHOUT CONTRAST CT CERVICAL SPINE WITHOUT CONTRAST TECHNIQUE: Multidetector CT imaging of the head and cervical spine was performed following the standard protocol without intravenous contrast. Multiplanar CT image reconstructions of the cervical spine were also generated. RADIATION DOSE REDUCTION: This exam was performed according to the departmental dose-optimization program which includes automated exposure control, adjustment of the mA and/or kV according to patient size and/or use of iterative reconstruction technique. COMPARISON:  None Available. FINDINGS: CT HEAD FINDINGS Brain: There is no evidence for acute hemorrhage, hydrocephalus, mass lesion, or abnormal extra-axial fluid collection. No definite CT evidence for acute infarction. Vascular: No hyperdense vessel or unexpected calcification. Skull: No evidence for fracture. No worrisome lytic or sclerotic lesion. Sinuses/Orbits: Chronic mucosal disease noted ethmoid air cells and right maxillary sinus Visualized portions of the globes and intraorbital fat are unremarkable. Other: None. CT CERVICAL SPINE FINDINGS Alignment: Normal. Skull base and vertebrae: No acute fracture. No primary bone lesion or focal pathologic process. Soft tissues and spinal canal: No prevertebral fluid or swelling. No visible canal hematoma. Disc levels:  Loss of disc height noted C6-7. Upper chest: Unremarkable. Other: None. IMPRESSION: 1. No acute intracranial abnormality. 2. No cervical spine fracture or subluxation. Electronically Signed   By: Misty Stanley M.D.   On: 08/05/2021 16:02   DG Chest Port 1 View  Result Date: 08/05/2021 CLINICAL DATA:  Chest pain. EXAM: PORTABLE CHEST 1 VIEW COMPARISON:  None Available.  FINDINGS: The heart is mildly enlarged. Both lungs are clear. The visualized skeletal structures are unremarkable. IMPRESSION: No active disease. Electronically Signed   By: Wyatt Mage  Ahmed D.O.   On: 08/05/2021 13:45    Radiological Exams on Admission: CT HEAD WO CONTRAST (5MM)  Result Date: 08/05/2021 CLINICAL DATA:  Memory and speech issues with neck pain and swelling after tree limb fell on head. EXAM: CT HEAD WITHOUT CONTRAST CT CERVICAL SPINE WITHOUT CONTRAST TECHNIQUE: Multidetector CT imaging of the head and cervical spine was performed following the standard protocol without intravenous contrast. Multiplanar CT image reconstructions of the cervical spine were also generated. RADIATION DOSE REDUCTION: This exam was performed according to the departmental dose-optimization program which includes automated exposure control, adjustment of the mA and/or kV according to patient size and/or use of iterative reconstruction technique. COMPARISON:  None Available. FINDINGS: CT HEAD FINDINGS Brain: There is no evidence for acute hemorrhage, hydrocephalus, mass lesion, or abnormal extra-axial fluid collection. No definite CT evidence for acute infarction. Vascular: No hyperdense vessel or unexpected calcification. Skull: No evidence for fracture. No worrisome lytic or sclerotic lesion. Sinuses/Orbits: Chronic mucosal disease noted ethmoid air cells and right maxillary sinus Visualized portions of the globes and intraorbital fat are unremarkable. Other: None. CT CERVICAL SPINE FINDINGS Alignment: Normal. Skull base and vertebrae: No acute fracture. No primary bone lesion or focal pathologic process. Soft tissues and spinal canal: No prevertebral fluid or swelling. No visible canal hematoma. Disc levels:  Loss of disc height noted C6-7. Upper chest: Unremarkable. Other: None. IMPRESSION: 1. No acute intracranial abnormality. 2. No cervical spine fracture or subluxation. Electronically Signed   By: Misty Stanley M.D.    On: 08/05/2021 16:02   CT CERVICAL SPINE WO CONTRAST  Result Date: 08/05/2021 CLINICAL DATA:  Memory and speech issues with neck pain and swelling after tree limb fell on head. EXAM: CT HEAD WITHOUT CONTRAST CT CERVICAL SPINE WITHOUT CONTRAST TECHNIQUE: Multidetector CT imaging of the head and cervical spine was performed following the standard protocol without intravenous contrast. Multiplanar CT image reconstructions of the cervical spine were also generated. RADIATION DOSE REDUCTION: This exam was performed according to the departmental dose-optimization program which includes automated exposure control, adjustment of the mA and/or kV according to patient size and/or use of iterative reconstruction technique. COMPARISON:  None Available. FINDINGS: CT HEAD FINDINGS Brain: There is no evidence for acute hemorrhage, hydrocephalus, mass lesion, or abnormal extra-axial fluid collection. No definite CT evidence for acute infarction. Vascular: No hyperdense vessel or unexpected calcification. Skull: No evidence for fracture. No worrisome lytic or sclerotic lesion. Sinuses/Orbits: Chronic mucosal disease noted ethmoid air cells and right maxillary sinus Visualized portions of the globes and intraorbital fat are unremarkable. Other: None. CT CERVICAL SPINE FINDINGS Alignment: Normal. Skull base and vertebrae: No acute fracture. No primary bone lesion or focal pathologic process. Soft tissues and spinal canal: No prevertebral fluid or swelling. No visible canal hematoma. Disc levels:  Loss of disc height noted C6-7. Upper chest: Unremarkable. Other: None. IMPRESSION: 1. No acute intracranial abnormality. 2. No cervical spine fracture or subluxation. Electronically Signed   By: Misty Stanley M.D.   On: 08/05/2021 16:02   DG Chest Port 1 View  Result Date: 08/05/2021 CLINICAL DATA:  Chest pain. EXAM: PORTABLE CHEST 1 VIEW COMPARISON:  None Available. FINDINGS: The heart is mildly enlarged. Both lungs are clear. The  visualized skeletal structures are unremarkable. IMPRESSION: No active disease. Electronically Signed   By: Keane Police D.O.   On: 08/05/2021 13:45    DVT Prophylaxis -SCD /iv Heparin AM Labs Ordered, also please review Full Orders  Family Communication: Admission, patients condition and  plan of care including tests being ordered have been discussed with the patient and wife who indicate understanding and agree with the plan   Condition  -stable  Roxan Hockey M.D on 08/05/2021 at 5:42 PM Go to [email or web address] -  for contact info  [facility] - Office  [phone]

## 2021-08-05 NOTE — ED Triage Notes (Signed)
Pt with CP the other night, worse today.  Seen at UC last night.  Possible reaction to antibiotics.  Pt with a rash to neck and arms.  Not able to take a deep breath.

## 2021-08-05 NOTE — ED Notes (Addendum)
Pt states vision changes intermittently- like looking through broken glass for about a year, noted in both eyes. Has an eye appt tomorrow.  Also c/o a roaring sound in bilateral ears.   EDP made aware.

## 2021-08-05 NOTE — ED Notes (Addendum)
Patient states that he has had a pounding headache for the last few days. Patient stated that tree limb fell on his head Saturday. Hospitalist made aware. Verbal order to stop heparin and obtain head CT.

## 2021-08-05 NOTE — Progress Notes (Signed)
ANTICOAGULATION CONSULT NOTE - Initial Consult  Pharmacy Consult for heparin Indication: chest pain/ACS  No Known Allergies  Patient Measurements: Height: 6\' 3"  (190.5 cm) Weight: 104.3 kg (230 lb) IBW/kg (Calculated) : 84.5 Heparin Dosing Weight: 104 kg  Vital Signs: Temp: 99.9 F (37.7 C) (06/06 1159) Temp Source: Oral (06/06 1159) BP: 148/78 (06/06 1300) Pulse Rate: 115 (06/06 1300)  Labs: Recent Labs    08/05/21 1205  HGB 12.2*  HCT 37.3*  PLT 261  CREATININE 1.05  TROPONINIHS 298*    Estimated Creatinine Clearance: 112.4 mL/min (by C-G formula based on SCr of 1.05 mg/dL).   Medical History: Past Medical History:  Diagnosis Date   Arthritis    back    Depression    takes Zoloft daily   Diabetes mellitus without complication ([facility])    takes Toujeo and Novolog nightly;average fasting blood sugar runs 150   History of kidney stones    Hyperlipidemia    takes Simvastatin daily   Hypertension    takes Losartan daily    Medications:  (Not in a hospital admission)   Assessment: Pharmacy consulted to dose heparin in patient with chest pain/ACS.  Patient is not on anticoagulation prior to admission.  CBC WNL Trop 298  Goal of Therapy:  Heparin level 0.3-0.7 units/ml Monitor platelets by anticoagulation protocol: Yes   Plan:  Give 4000 units bolus x 1 Start heparin infusion at 1300 units/hr Check anti-Xa level in 6 hours and daily while on heparin Continue to monitor H&H and platelets  10/05/21, PharmD Clinical Pharmacist 08/05/2021 1:25 PM

## 2021-08-05 NOTE — ED Notes (Signed)
ED Provider at bedside. 

## 2021-08-05 NOTE — ED Notes (Signed)
CT called at this time for availability for head CT.

## 2021-08-05 NOTE — Consult Note (Signed)
CARDIOLOGY CONSULT NOTE       Patient ID: Jesse Rodgers MRN: [id number] DOB/AGE: [birth year] 46 y.o.  Admit date: 08/05/2021 Referring Physician: Vanita Panda Primary Physician: [place] Primary Cardiologist: New  Reason for Consultation: Chest Pain  Active Problems:   * No active hospital problems. *   HPI:  46 y.o. history of IDDM, chronic back pain, depression, HTN and HLD. Came to ER for rash on neck/back ? Infection also complaining of severe headache and some SSCP since Monday On my interview patient primarily complains of rash and headache ECG non acute troponin 298 He has no documented CAD or vascular disease Had an ingrown toenail worked on prior to rash and some pain in right thigh where his insulin pump was No fever or other focal infectious signs No history of murmur , SBE WBC mildly elevated at 10.9  CXR NAD Has had some morphine for headache and no chest pain at this time  ROS All other systems reviewed and negative except as noted above  Past Medical History:  Diagnosis Date   Arthritis    back    Depression    takes Zoloft daily   Diabetes mellitus without complication ([place])    takes Toujeo and Novolog nightly;average fasting blood sugar runs 150   History of kidney stones    Hyperlipidemia    takes Simvastatin daily   Hypertension    takes Losartan daily    History reviewed. No pertinent family history.  Social History   Socioeconomic History   Marital status: Married    Spouse name: Not on file   Number of children: Not on file   Years of education: Not on file   Highest education level: Not on file  Occupational History   Not on file  Tobacco Use   Smoking status: Former    Packs/day: 1.00    Years: 15.00    Pack years: 15.00    Types: Cigarettes    Quit date: 02/08/2019    Years since quitting: 2.4   Smokeless tobacco: Never  Vaping Use   Vaping Use: Never used  Substance and Sexual Activity   Alcohol use: Yes     Comment: occasional   Drug use: No   Sexual activity: Yes  Other Topics Concern   Not on file  Social History Narrative   Not on file   Social Determinants of Health   Financial Resource Strain: Not on file  Food Insecurity: Not on file  Transportation Needs: Not on file  Physical Activity: Not on file  Stress: Not on file  Social Connections: Not on file  Intimate Partner Violence: Not on file    Past Surgical History:  Procedure Laterality Date   [place]  12/10/2014   molar tooth #  14,15,[id number]  bone biopsy  of ramus   PARTIAL COLECTOMY     TOOTH EXTRACTION N/A 12/10/2014   Procedure: EXTRACTION MOLARS #14,15,16  INTRAORAL AND EXTRAORAL INCISION AND DRAINAGE AND BONE BIOPSY OF LEFT RAMUS;  Surgeon: Tamela Oddi, DDS;  Location: [facility];  Service: Oral Surgery;  Laterality: N/A;      Current Facility-Administered Medications:    heparin ADULT infusion 100 units/mL (25000 units/216m), 1,300 Units/hr, Intravenous, Continuous, LCarmin Muskrat MD, Last Rate: 13 mL/hr at 08/05/21 1338, 1,300 Units/hr at 08/05/21 1338   metoprolol tartrate (LOPRESSOR) tablet 50 mg, 50 mg, Oral, BID, Carleena Mires,  Wallis Bamberg, MD  Current Outpatient Medications:    furosemide (LASIX) 20 MG tablet, Take 20 mg by mouth daily as needed., Disp: , Rfl:    glucagon (GLUCAGON EMERGENCY) 1 MG injection, Inject 1 mg into the vein once as needed., Disp: 1 each, Rfl: 12   insulin aspart (NOVOLOG) 100 UNIT/ML injection, Use with Omnipod for TDD around 80 units daily, Disp: 40 mL, Rfl: 3   Insulin Disposable Pump (OMNIPOD 5 G6 POD, GEN 5,) MISC, Change pod every 48-72 hrs, Disp: 6 each, Rfl: 6   losartan (COZAAR) 25 MG tablet, Take 1 tablet (25 mg total) by mouth daily., Disp: 90 tablet, Rfl: 3   Multiple Vitamin (MULTIVITAMIN WITH MINERALS) TABS tablet, Take 1 tablet by mouth daily., Disp: , Rfl:    NON FORMULARY, Taking Total Beet product daily, Disp:  , Rfl:    rosuvastatin (CRESTOR) 20 MG tablet, Take 1 tablet (20 mg total) by mouth daily., Disp: 90 tablet, Rfl: 3   sertraline (ZOLOFT) 100 MG tablet, Take 100 mg by mouth daily., Disp: , Rfl:    Continuous Blood Gluc Receiver (Lares) DEVI, Use to check glucose four times daily as directed., Disp: 1 each, Rfl: 0   Continuous Blood Gluc Sensor (DEXCOM G6 SENSOR) MISC, Apply new sensor every 10 days as directed., Disp: 9 each, Rfl: 3   Continuous Blood Gluc Transmit (DEXCOM G6 TRANSMITTER) MISC, USE TO TEST BLOOD GLUCOSE 4-5 TIMES DAILY, Disp: 1 each, Rfl: 1   glucose blood (FREESTYLE TEST STRIPS) test strip, Use as instructed to monitor glucose 4 times daily, before breakfast and before bed., Disp: 100 each, Rfl: 12   insulin degludec (TRESIBA FLEXTOUCH) 100 UNIT/ML FlexTouch Pen, Inject 58 Units into the skin at bedtime. (Patient not taking: Reported on 08/05/2021), Disp: 45 mL, Rfl: 1   Insulin Disposable Pump (OMNIPOD 5 G6 INTRO, GEN 5,) KIT, Change pod every 48-72 hrs, Disp: 1 kit, Rfl: 0  metoprolol tartrate  50 mg Oral BID    heparin 1,300 Units/hr (08/05/21 1338)    Physical Exam: Blood pressure 130/74, pulse (!) 116, temperature 99.9 F (37.7 C), temperature source Oral, resp. rate (!) 25, height _0  (1.905 m), weight 104.3 kg, SpO2 94 %.    Affect appropriate Healthy:  appears stated age 55: normal Neck supple with no adenopathy JVP normal no bruits no thyromegaly Lungs clear with no wheezing and good diaphragmatic motion Heart:  S1/S2 no murmur, no rub, gallop or click PMI normal Abdomen: benighn, BS positve, no tenderness, no AAA no bruit.  No HSM or HJR Distal pulses intact with no bruits No edema Neuro non-focal Erythema head/neck and shoulders with rash  No muscular weakness   Labs:   Lab Results  Component Value Date   WBC 10.9 (H) 08/05/2021   HGB 12.2 (L) 08/05/2021   HCT 37.3 (L) 08/05/2021   MCV 90.8 08/05/2021   PLT 261 08/05/2021     Recent Labs  Lab 08/05/21 1205  NA 136  K 4.6  CL 100  CO2 25  BUN 17  CREATININE 1.05  CALCIUM 9.0  PROT 7.7  BILITOT 1.4*  ALKPHOS 131*  ALT 66*  AST 57*  GLUCOSE 182*   No results found for: CKTOTAL, CKMB, CKMBINDEX, TROPONINI  Lab Results  Component Value Date   CHOL 215 (H) 02/07/2021   CHOL 238 (H) 02/06/2020   Lab Results  Component Value Date   HDL 53 02/07/2021   HDL 52 02/06/2020  Lab Results  Component Value Date   LDLCALC 97 02/07/2021   LDLCALC 131 (H) 02/06/2020   Lab Results  Component Value Date   TRIG 393 (H) 02/07/2021   TRIG 385 (H) 02/06/2020   Lab Results  Component Value Date   CHOLHDL 4.1 02/07/2021   CHOLHDL 4.6 02/06/2020   No results found for: LDLDIRECT    Radiology: [facility] Chest Port 1 View  Result Date: 08/05/2021 CLINICAL DATA:  Chest pain. EXAM: PORTABLE CHEST 1 VIEW COMPARISON:  None Available. FINDINGS: The heart is mildly enlarged. Both lungs are clear. The visualized skeletal structures are unremarkable. IMPRESSION: No active disease. Electronically Signed   By: Keane Police D.O.   On: 08/05/2021 13:45    EKG: ST rate 118 no acute changes    ASSESSMENT AND PLAN:   Chest Pain:  secondary complaint no acute ecg changes ER started him on heparin Start lopressor 50 mg bid for tachycardia TTE while in ER trend troponin's He is IDDM Since he is pain free with no acute ECG changes will follow here and transfer to [facility] if troponins go very high, or TTE shows significant decrease in EF.   IDDM:  continue insulin pump per primary service check A1c Rash:  ? Etiology may benefit from benedryl avoid steroids if possible given possible acute coronary syndrome HTN  continue losartan ARB HLD:  continue crestor    Signed: Jenkins Rouge 08/05/2021, 1:57 PM

## 2021-08-05 NOTE — Progress Notes (Signed)
Inpatient Diabetes Program Recommendations  AACE/ADA: New Consensus Statement on Inpatient Glycemic Control (2015)  Target Ranges:  Prepandial:   less than 140 mg/dL      Peak postprandial:   less than 180 mg/dL (1-2 hours)      Critically ill patients:  140 - 180 mg/dL   Lab Results  Component Value Date   GLUCAP 128 (H) 12/12/2014   HGBA1C 6.9 06/12/2021    Review of Glycemic Control  Diabetes history: DM1(does not make insulin.  Needs correction, basal and meal coverage)  Outpatient Diabetes medications: OmniPod insulin pump using Aspart  Current orders for Inpatient glycemic control: None  Spoke with patient on the phone.  He confirms he has T1DM and was diagnosed around 46 years old.  He states he is wearing his insulin pump and Dexcom; it should expire tomorrow.  Told him if they need to remove the Dexcom for any reason he needs to get his transmitter and keep with his belongings or give to support person.    He is current with Dr. Liliane Channel office and last saw the NP on 06/12/21.  A1C on 06/12/21 was 6.9%.  He was started on the Omnipod shortly after that visit.  Cannot find settings in chart review; he is unsure of settings.  He was previously on Tresiba 58 units QHS and Aspart 8-14 units TID.  He states he has not been happy with the pump and that his blood sugars were better controlled when he was on basal /bolus injections.    If pump remains on please order Insulin Pump Therapy order set and print flow sheet and contract.   If pump should need to be removed, please consider:  Semglee 30 units QD Novolog 0-9 units TID and 0-5 units QHS Once eating-Novolog 4 units TID with meals  Will continue to follow while inpatient.  Thank you, Reche Dixon, MSN, [place] Diabetes Coordinator Inpatient Diabetes Program [phone] (team pager from 8a-5p)

## 2021-08-05 NOTE — ED Notes (Addendum)
Patients sister at bedside. Voices concerns of altered mental status. Patient states that he has had memory problems and speech problems. Patient states that a tree limb fell on his head Saturday. Hospitalist made aware.

## 2021-08-05 NOTE — ED Notes (Signed)
Patient educated on need for urine sample. Urinal provided.  ?

## 2021-08-06 ENCOUNTER — Observation Stay (HOSPITAL_COMMUNITY): Payer: BC Managed Care – PPO

## 2021-08-06 ENCOUNTER — Inpatient Hospital Stay (HOSPITAL_COMMUNITY): Payer: BC Managed Care – PPO

## 2021-08-06 DIAGNOSIS — E782 Mixed hyperlipidemia: Secondary | ICD-10-CM | POA: Diagnosis present

## 2021-08-06 DIAGNOSIS — R7989 Other specified abnormal findings of blood chemistry: Secondary | ICD-10-CM | POA: Diagnosis not present

## 2021-08-06 DIAGNOSIS — E109 Type 1 diabetes mellitus without complications: Secondary | ICD-10-CM | POA: Diagnosis present

## 2021-08-06 DIAGNOSIS — I10 Essential (primary) hypertension: Secondary | ICD-10-CM | POA: Diagnosis present

## 2021-08-06 DIAGNOSIS — Z79899 Other long term (current) drug therapy: Secondary | ICD-10-CM | POA: Diagnosis not present

## 2021-08-06 DIAGNOSIS — R21 Rash and other nonspecific skin eruption: Secondary | ICD-10-CM | POA: Diagnosis present

## 2021-08-06 DIAGNOSIS — Z8249 Family history of ischemic heart disease and other diseases of the circulatory system: Secondary | ICD-10-CM | POA: Diagnosis not present

## 2021-08-06 DIAGNOSIS — R079 Chest pain, unspecified: Secondary | ICD-10-CM | POA: Diagnosis not present

## 2021-08-06 DIAGNOSIS — F32A Depression, unspecified: Secondary | ICD-10-CM | POA: Diagnosis present

## 2021-08-06 DIAGNOSIS — H532 Diplopia: Secondary | ICD-10-CM | POA: Diagnosis present

## 2021-08-06 DIAGNOSIS — E1069 Type 1 diabetes mellitus with other specified complication: Secondary | ICD-10-CM | POA: Diagnosis not present

## 2021-08-06 DIAGNOSIS — R778 Other specified abnormalities of plasma proteins: Secondary | ICD-10-CM | POA: Diagnosis not present

## 2021-08-06 DIAGNOSIS — L03221 Cellulitis of neck: Secondary | ICD-10-CM

## 2021-08-06 DIAGNOSIS — F17210 Nicotine dependence, cigarettes, uncomplicated: Secondary | ICD-10-CM | POA: Diagnosis present

## 2021-08-06 DIAGNOSIS — Z794 Long term (current) use of insulin: Secondary | ICD-10-CM | POA: Diagnosis not present

## 2021-08-06 DIAGNOSIS — R519 Headache, unspecified: Secondary | ICD-10-CM | POA: Diagnosis present

## 2021-08-06 DIAGNOSIS — W57XXXA Bitten or stung by nonvenomous insect and other nonvenomous arthropods, initial encounter: Secondary | ICD-10-CM | POA: Diagnosis present

## 2021-08-06 DIAGNOSIS — T148XXA Other injury of unspecified body region, initial encounter: Secondary | ICD-10-CM | POA: Diagnosis present

## 2021-08-06 DIAGNOSIS — Z9641 Presence of insulin pump (external) (internal): Secondary | ICD-10-CM | POA: Diagnosis present

## 2021-08-06 DIAGNOSIS — I248 Other forms of acute ischemic heart disease: Secondary | ICD-10-CM | POA: Diagnosis present

## 2021-08-06 DIAGNOSIS — F1010 Alcohol abuse, uncomplicated: Secondary | ICD-10-CM | POA: Diagnosis present

## 2021-08-06 LAB — CBC
HCT: 31 % — ABNORMAL LOW (ref 39.0–52.0)
Hemoglobin: 10.3 g/dL — ABNORMAL LOW (ref 13.0–17.0)
MCH: 29.7 pg (ref 26.0–34.0)
MCHC: 33.2 g/dL (ref 30.0–36.0)
MCV: 89.3 fL (ref 80.0–100.0)
Platelets: 238 10*3/uL (ref 150–400)
RBC: 3.47 MIL/uL — ABNORMAL LOW (ref 4.22–5.81)
RDW: 12.3 % (ref 11.5–15.5)
WBC: 9.4 10*3/uL (ref 4.0–10.5)
nRBC: 0 % (ref 0.0–0.2)

## 2021-08-06 LAB — LIPID PANEL
Cholesterol: 120 mg/dL (ref 0–200)
HDL: 40 mg/dL — ABNORMAL LOW (ref >40)
LDL Cholesterol: 52 mg/dL (ref 0–99)
Total CHOL/HDL Ratio: 3 RATIO
Triglycerides: 138 mg/dL (ref <150)
VLDL: 28 mg/dL (ref 0–40)

## 2021-08-06 LAB — HEPARIN LEVEL (UNFRACTIONATED)
Heparin Unfractionated: 0.14 IU/mL — ABNORMAL LOW (ref 0.30–0.70)
Heparin Unfractionated: 0.17 IU/mL — ABNORMAL LOW (ref 0.30–0.70)
Heparin Unfractionated: 0.16 IU/mL — ABNORMAL LOW (ref 0.30–0.70)

## 2021-08-06 LAB — GLUCOSE, CAPILLARY
Glucose-Capillary: 112 mg/dL — ABNORMAL HIGH (ref 70–99)
Glucose-Capillary: 135 mg/dL — ABNORMAL HIGH (ref 70–99)
Glucose-Capillary: 183 mg/dL — ABNORMAL HIGH (ref 70–99)
Glucose-Capillary: 395 mg/dL — ABNORMAL HIGH (ref 70–99)
Glucose-Capillary: 477 mg/dL — ABNORMAL HIGH (ref 70–99)
Glucose-Capillary: 74 mg/dL (ref 70–99)

## 2021-08-06 LAB — BASIC METABOLIC PANEL
Anion gap: 7 (ref 5–15)
BUN: 24 mg/dL — ABNORMAL HIGH (ref 6–20)
CO2: 25 mmol/L (ref 22–32)
Calcium: 8.3 mg/dL — ABNORMAL LOW (ref 8.9–10.3)
Chloride: 102 mmol/L (ref 98–111)
Creatinine, Ser: 1.11 mg/dL (ref 0.61–1.24)
GFR, Estimated: >60 mL/min (ref >60)
Glucose, Bld: 134 mg/dL — ABNORMAL HIGH (ref 70–99)
Potassium: 4.5 mmol/L (ref 3.5–5.1)
Sodium: 134 mmol/L — ABNORMAL LOW (ref 135–145)

## 2021-08-06 LAB — ECHOCARDIOGRAM COMPLETE
AR max vel: 2.99 cm2
AV Area VTI: 3.07 cm2
AV Area mean vel: 2.69 cm2
AV Mean grad: 3 mmHg
AV Peak grad: 4.6 mmHg
Ao pk vel: 1.07 m/s
Area-P 1/2: 3.91 cm2
Calc EF: 57.5 %
Height: 76 in
MV VTI: 2.69 cm2
S' Lateral: 3.4 cm
Single Plane A2C EF: 54.8 %
Single Plane A4C EF: 60.3 %
Weight: 3714.31 oz

## 2021-08-06 LAB — TSH: TSH: 2.671 u[IU]/mL (ref 0.350–4.500)

## 2021-08-06 LAB — TROPONIN I (HIGH SENSITIVITY): Troponin I (High Sensitivity): 103 ng/L (ref <18)

## 2021-08-06 MED ORDER — HEPARIN BOLUS VIA INFUSION
3000.0000 [IU] | Freq: Once | INTRAVENOUS | Status: AC
  Administered 2021-08-06: 3000 [IU] via INTRAVENOUS
  Filled 2021-08-06: qty 3000

## 2021-08-06 MED ORDER — DIPHENHYDRAMINE HCL 50 MG/ML IJ SOLN
25.0000 mg | Freq: Once | INTRAMUSCULAR | Status: AC
  Administered 2021-08-06: 25 mg via INTRAVENOUS
  Filled 2021-08-06: qty 1

## 2021-08-06 MED ORDER — HEPARIN BOLUS VIA INFUSION
2000.0000 [IU] | Freq: Once | INTRAVENOUS | Status: AC
  Administered 2021-08-06: 2000 [IU] via INTRAVENOUS
  Filled 2021-08-06: qty 2000

## 2021-08-06 MED ORDER — REGADENOSON 0.4 MG/5ML IV SOLN
0.4000 mg | Freq: Once | INTRAVENOUS | Status: DC
  Filled 2021-08-06: qty 5

## 2021-08-06 MED ORDER — METOCLOPRAMIDE HCL 5 MG/ML IJ SOLN
10.0000 mg | Freq: Once | INTRAMUSCULAR | Status: AC
Start: 2021-08-06 — End: 2021-08-06
  Administered 2021-08-06: 10 mg via INTRAVENOUS
  Filled 2021-08-06: qty 2

## 2021-08-06 MED ORDER — INSULIN ASPART 100 UNIT/ML IJ SOLN
8.0000 [IU] | Freq: Once | INTRAMUSCULAR | Status: AC
Start: 2021-08-06 — End: 2021-08-06
  Administered 2021-08-06: 8 [IU] via SUBCUTANEOUS

## 2021-08-06 MED ORDER — INSULIN ASPART 100 UNIT/ML IJ SOLN
0.0000 [IU] | Freq: Three times a day (TID) | INTRAMUSCULAR | Status: DC
  Administered 2021-08-07: 7 [IU] via SUBCUTANEOUS
  Administered 2021-08-07: 3 [IU] via SUBCUTANEOUS

## 2021-08-06 MED ORDER — INSULIN GLARGINE-YFGN 100 UNIT/ML ~~LOC~~ SOLN
25.0000 [IU] | Freq: Every day | SUBCUTANEOUS | Status: DC
  Administered 2021-08-06: 25 [IU] via SUBCUTANEOUS
  Administered 2021-08-07: 20 [IU] via SUBCUTANEOUS
  Filled 2021-08-06 (×4): qty 0.25

## 2021-08-06 MED ORDER — DOXYCYCLINE HYCLATE 100 MG PO TABS
100.0000 mg | ORAL_TABLET | Freq: Two times a day (BID) | ORAL | Status: DC
  Administered 2021-08-06 – 2021-08-07 (×3): 100 mg via ORAL
  Filled 2021-08-06 (×3): qty 1

## 2021-08-06 MED ORDER — DEXTROSE 50 % IV SOLN
25.0000 mL | Freq: Once | INTRAVENOUS | Status: AC
  Administered 2021-08-06: 25 mL via INTRAVENOUS
  Filled 2021-08-06: qty 50

## 2021-08-06 MED ORDER — KETOROLAC TROMETHAMINE 30 MG/ML IJ SOLN
30.0000 mg | Freq: Once | INTRAMUSCULAR | Status: AC
Start: 2021-08-06 — End: 2021-08-06
  Administered 2021-08-06: 30 mg via INTRAVENOUS
  Filled 2021-08-06: qty 1

## 2021-08-06 NOTE — Progress Notes (Signed)
Patient's insulin pump showed BS 55 gave D50 87ml per MD order BS rechecked by patient now 141. Patient's pump changed from auto so will not continue to administer insulin while NPO.

## 2021-08-06 NOTE — Progress Notes (Incomplete)
*  PRELIMINARY RESULTS* Echocardiogram 2D Echocardiogram has been performed.  Carolyne Fiscal 08/06/2021, 9:36 AM

## 2021-08-06 NOTE — Progress Notes (Signed)
[place] for heparin Indication: chest pain/ACS  No Known Allergies  Patient Measurements: Height: '6\' 4"'  (193 cm) Weight: 105.3 kg (232 lb 2.3 oz) IBW/kg (Calculated) : 86.8 Heparin Dosing Weight: 104 kg  Vital Signs: Temp: 99.3 F (37.4 C) (06/07 0407) Temp Source: Oral (06/07 0407) BP: 116/61 (06/07 0820) Pulse Rate: 88 (06/07 0820)  Labs: Recent Labs    08/05/21 1205 08/05/21 1405 08/05/21 2211 08/06/21 0427 08/06/21 0438  HGB 12.2*  --   --  10.3*  --   HCT 37.3*  --   --  31.0*  --   PLT 261  --   --  238  --   HEPARINUNFRC  --   --  0.12* 0.14*  --   CREATININE 1.05  --   --  1.11  --   TROPONINIHS 298* 271*  --   --  103*     Estimated Creatinine Clearance: 108.4 mL/min (by C-G formula based on SCr of 1.11 mg/dL).   Medical History: Past Medical History:  Diagnosis Date   Arthritis    back    Depression    takes Zoloft daily   Diabetes mellitus without complication ([place])    takes Toujeo and Novolog nightly;average fasting blood sugar runs 150   History of kidney stones    Hyperlipidemia    takes Simvastatin daily   Hypertension    takes Losartan daily    Medications:  Medications Prior to Admission  Medication Sig Dispense Refill Last Dose   furosemide (LASIX) 20 MG tablet Take 20 mg by mouth daily as needed.   08/04/2021   glucagon (GLUCAGON EMERGENCY) 1 MG injection Inject 1 mg into the vein once as needed. 1 each 12 unk   insulin aspart (NOVOLOG) 100 UNIT/ML injection Use with Omnipod for TDD around 80 units daily 40 mL 3 08/05/2021   Insulin Disposable Pump (OMNIPOD 5 G6 POD, GEN 5,) MISC Change pod every 48-72 hrs 6 each 6 08/05/2021   losartan (COZAAR) 25 MG tablet Take 1 tablet (25 mg total) by mouth daily. 90 tablet 3 Past Month   Multiple Vitamin (MULTIVITAMIN WITH MINERALS) TABS tablet Take 1 tablet by mouth daily.   08/04/2021   NON FORMULARY Taking Total Beet product daily   08/04/2021   rosuvastatin  (CRESTOR) 20 MG tablet Take 1 tablet (20 mg total) by mouth daily. 90 tablet 3 08/04/2021   sertraline (ZOLOFT) 100 MG tablet Take 100 mg by mouth daily.   Past Month   Continuous Blood Gluc Receiver (DEXCOM G6 RECEIVER) DEVI Use to check glucose four times daily as directed. 1 each 0    Continuous Blood Gluc Sensor (DEXCOM G6 SENSOR) MISC Apply new sensor every 10 days as directed. 9 each 3    Continuous Blood Gluc Transmit (DEXCOM G6 TRANSMITTER) MISC USE TO TEST BLOOD GLUCOSE 4-5 TIMES DAILY 1 each 1    glucose blood (FREESTYLE TEST STRIPS) test strip Use as instructed to monitor glucose 4 times daily, before breakfast and before bed. 100 each 12    insulin degludec (TRESIBA FLEXTOUCH) 100 UNIT/ML FlexTouch Pen Inject 58 Units into the skin at bedtime. (Patient not taking: Reported on 08/05/2021) 45 mL 1 Not Taking   Insulin Disposable Pump (OMNIPOD 5 G6 INTRO, GEN 5,) KIT Change pod every 48-72 hrs 1 kit 0     Assessment: Pharmacy consulted to dose heparin in patient with chest pain/ACS.  Patient is not on anticoagulation prior to admission.  CBC WNL Trop 298  6/7am HL 0.14, subtherapeutic   Goal of Therapy:  Heparin level 0.3-0.7 units/ml Monitor platelets by anticoagulation protocol: Yes   Plan:  Heparin 2000 units bolus Inc heparin IV to 1800 units/hr Re-check heparin level with AM labs  Thomasenia Sales, PharmD, [facility] Clinical Pharmacist

## 2021-08-06 NOTE — Progress Notes (Signed)
[place] for heparin Indication: chest pain/ACS  No Known Allergies  Patient Measurements: Height: _0  (193 cm) Weight: 105.3 kg (232 lb 2.3 oz) IBW/kg (Calculated) : 86.8 Heparin Dosing Weight: 104 kg  Vital Signs: Temp: 99.3 F (37.4 C) (06/07 0407) Temp Source: Oral (06/07 0407) BP: 116/61 (06/07 0820) Pulse Rate: 88 (06/07 0820)  Labs: Recent Labs    08/05/21 1205 08/05/21 1405 08/05/21 2211 08/06/21 0427 08/06/21 0438 08/06/21 1359  HGB 12.2*  --   --  10.3*  --   --   HCT 37.3*  --   --  31.0*  --   --   PLT 261  --   --  238  --   --   HEPARINUNFRC  --   --  0.12* 0.14*  --  0.17*  CREATININE 1.05  --   --  1.11  --   --   TROPONINIHS 298* 271*  --   --  103*  --      Estimated Creatinine Clearance: 108.4 mL/min (by C-G formula based on SCr of 1.11 mg/dL).   Medical History: Past Medical History:  Diagnosis Date   Arthritis    back    Depression    takes Zoloft daily   Diabetes mellitus without complication ([place])    takes Toujeo and Novolog nightly;average fasting blood sugar runs 150   History of kidney stones    Hyperlipidemia    takes Simvastatin daily   Hypertension    takes Losartan daily    Medications:  Medications Prior to Admission  Medication Sig Dispense Refill Last Dose   furosemide (LASIX) 20 MG tablet Take 20 mg by mouth daily as needed.   08/04/2021   glucagon (GLUCAGON EMERGENCY) 1 MG injection Inject 1 mg into the vein once as needed. 1 each 12 unk   insulin aspart (NOVOLOG) 100 UNIT/ML injection Use with Omnipod for TDD around 80 units daily 40 mL 3 08/05/2021   Insulin Disposable Pump (OMNIPOD 5 G6 POD, GEN 5,) MISC Change pod every 48-72 hrs 6 each 6 08/05/2021   losartan (COZAAR) 25 MG tablet Take 1 tablet (25 mg total) by mouth daily. 90 tablet 3 Past Month   Multiple Vitamin (MULTIVITAMIN WITH MINERALS) TABS tablet Take 1 tablet by mouth daily.   08/04/2021   NON FORMULARY Taking Total  Beet product daily   08/04/2021   rosuvastatin (CRESTOR) 20 MG tablet Take 1 tablet (20 mg total) by mouth daily. 90 tablet 3 08/04/2021   sertraline (ZOLOFT) 100 MG tablet Take 100 mg by mouth daily.   Past Month   Continuous Blood Gluc Receiver (DEXCOM G6 RECEIVER) DEVI Use to check glucose four times daily as directed. 1 each 0    Continuous Blood Gluc Sensor (DEXCOM G6 SENSOR) MISC Apply new sensor every 10 days as directed. 9 each 3    Continuous Blood Gluc Transmit (DEXCOM G6 TRANSMITTER) MISC USE TO TEST BLOOD GLUCOSE 4-5 TIMES DAILY 1 each 1    glucose blood (FREESTYLE TEST STRIPS) test strip Use as instructed to monitor glucose 4 times daily, before breakfast and before bed. 100 each 12    insulin degludec (TRESIBA FLEXTOUCH) 100 UNIT/ML FlexTouch Pen Inject 58 Units into the skin at bedtime. (Patient not taking: Reported on 08/05/2021) 45 mL 1 Not Taking   Insulin Disposable Pump (OMNIPOD 5 G6 INTRO, GEN 5,) KIT Change pod every 48-72 hrs 1 kit 0     Assessment: Pharmacy  consulted to dose heparin in patient with chest pain/ACS.  Patient is not on anticoagulation prior to admission.  CBC WNL Trop 298  6/7_0  HL 0.17, subtherapeutic   Goal of Therapy:  Heparin level 0.3-0.7 units/ml Monitor platelets by anticoagulation protocol: Yes   Plan:  Heparin 2000 units bolus Inc heparin IV to 2100 units/hr Re-check heparin level at 2100 and with AM labs  Thomasenia Sales, PharmD, [facility] Clinical Pharmacist

## 2021-08-06 NOTE — Progress Notes (Signed)
ANTICOAGULATION CONSULT NOTE   Pharmacy Consult for heparin Indication: chest pain/ACS  No Known Allergies  Patient Measurements: Height: '6\' 4"'  (193 cm) Weight: 105.3 kg (232 lb 2.3 oz) IBW/kg (Calculated) : 86.8 Heparin Dosing Weight: 104 kg  Vital Signs: Temp: 98.2 F (36.8 C) (06/07 2128) Temp Source: Oral (06/07 2128) BP: 109/33 (06/07 2128) Pulse Rate: 102 (06/07 2128)  Labs: Recent Labs    08/05/21 1205 08/05/21 1405 08/05/21 2211 08/06/21 0427 08/06/21 0438 08/06/21 1359 08/06/21 2055  HGB 12.2*  --   --  10.3*  --   --   --   HCT 37.3*  --   --  31.0*  --   --   --   PLT 261  --   --  238  --   --   --   HEPARINUNFRC  --   --    < > 0.14*  --  0.17* 0.16*  CREATININE 1.05  --   --  1.11  --   --   --   TROPONINIHS 298* 271*  --   --  103*  --   --    < > = values in this interval not displayed.     Estimated Creatinine Clearance: 108.4 mL/min (by C-G formula based on SCr of 1.11 mg/dL).   Medical History: Past Medical History:  Diagnosis Date   Arthritis    back    Depression    takes Zoloft daily   Diabetes mellitus without complication ([place])    takes Toujeo and Novolog nightly;average fasting blood sugar runs 150   History of kidney stones    Hyperlipidemia    takes Simvastatin daily   Hypertension    takes Losartan daily    Medications:  Medications Prior to Admission  Medication Sig Dispense Refill Last Dose   furosemide (LASIX) 20 MG tablet Take 20 mg by mouth daily as needed.   08/04/2021   glucagon (GLUCAGON EMERGENCY) 1 MG injection Inject 1 mg into the vein once as needed. 1 each 12 unk   insulin aspart (NOVOLOG) 100 UNIT/ML injection Use with Omnipod for TDD around 80 units daily 40 mL 3 08/05/2021   Insulin Disposable Pump (OMNIPOD 5 G6 POD, GEN 5,) MISC Change pod every 48-72 hrs 6 each 6 08/05/2021   losartan (COZAAR) 25 MG tablet Take 1 tablet (25 mg total) by mouth daily. 90 tablet 3 Past Month   Multiple Vitamin (MULTIVITAMIN WITH  MINERALS) TABS tablet Take 1 tablet by mouth daily.   08/04/2021   NON FORMULARY Taking Total Beet product daily   08/04/2021   rosuvastatin (CRESTOR) 20 MG tablet Take 1 tablet (20 mg total) by mouth daily. 90 tablet 3 08/04/2021   sertraline (ZOLOFT) 100 MG tablet Take 100 mg by mouth daily.   Past Month   Continuous Blood Gluc Receiver (DEXCOM G6 RECEIVER) DEVI Use to check glucose four times daily as directed. 1 each 0    Continuous Blood Gluc Sensor (DEXCOM G6 SENSOR) MISC Apply new sensor every 10 days as directed. 9 each 3    Continuous Blood Gluc Transmit (DEXCOM G6 TRANSMITTER) MISC USE TO TEST BLOOD GLUCOSE 4-5 TIMES DAILY 1 each 1    glucose blood (FREESTYLE TEST STRIPS) test strip Use as instructed to monitor glucose 4 times daily, before breakfast and before bed. 100 each 12    insulin degludec (TRESIBA FLEXTOUCH) 100 UNIT/ML FlexTouch Pen Inject 58 Units into the skin at bedtime. (Patient not taking: Reported on  08/05/2021) 45 mL 1 Not Taking   Insulin Disposable Pump (OMNIPOD 5 G6 INTRO, GEN 5,) KIT Change pod every 48-72 hrs 1 kit 0     Assessment: Pharmacy consulted to dose heparin in patient with chest pain/ACS.  Patient is not on anticoagulation prior to admission.  Heparin running at 2100 units/hr following 2000 unit bolus earlier today following dose increase due to sub-therapeutic level.  Heparin level tonight back at 0.16 which remains sub-therapeutic.  Hgb 10.3 (down from 12.2) Plt 261  Goal of Therapy:  Heparin level 0.3-0.7 units/ml Monitor platelets by anticoagulation protocol: Yes   Plan:  Will give 3000 unit IV heparin bolus Increase heparin infusion to 2400 units/hr Re-check heparin level at 0400 and daily Monitor for bleeding and H/H  Lorelei Pont, PharmD, BCPS 08/06/2021 9:45 PM ED Clinical Pharmacist -  [phone]

## 2021-08-06 NOTE — Progress Notes (Signed)
Subjective:  Still with headache Discussed ETOH pleuritic type pain when taking deep breath   Objective:  Vitals:   08/05/21 1827 08/05/21 2030 08/06/21 0028 08/06/21 0407  BP: 122/65 (!) 105/52 93/61 103/65  Pulse: (!) 104 67 74 84  Resp: 20 20 20 20   Temp: 98.4 F (36.9 C) 98.3 F (36.8 C) 98 F (36.7 C) 99.3 F (37.4 C)  TempSrc: Oral Oral Oral Oral  SpO2: 95% 93% 96% 96%  Weight:      Height:        Intake/Output from previous day:  Intake/Output Summary (Last 24 hours) at 08/06/2021 10/06/2021 Last data filed at 08/06/2021 0500 Gross per 24 hour  Intake 1571.84 ml  Output --  Net 1571.84 ml    Physical Exam: Affect appropriate Healthy:  appears stated age HEENT: normal Neck supple with no adenopathy JVP normal no bruits no thyromegaly Lungs clear with no wheezing and good diaphragmatic motion Heart:  S1/S2 no murmur, no rub, gallop or click PMI normal Abdomen: benighn, BS positve, no tenderness, no AAA no bruit.  No HSM or HJR Distal pulses intact with no bruits No edema Neuro non-focal Rash over neck and shoulders  No muscular weakness   Lab Results: Basic Metabolic Panel: Recent Labs    08/05/21 1205 08/06/21 0427  NA 136 134*  K 4.6 4.5  CL 100 102  CO2 25 25  GLUCOSE 182* 134*  BUN 17 24*  CREATININE 1.05 1.11  CALCIUM 9.0 8.3*   Liver Function Tests: Recent Labs    08/05/21 1205  AST 57*  ALT 66*  ALKPHOS 131*  BILITOT 1.4*  PROT 7.7  ALBUMIN 3.6   No results for input(s): LIPASE, AMYLASE in the last 72 hours. CBC: Recent Labs    08/05/21 1205 08/06/21 0427  WBC 10.9* 9.4  NEUTROABS 9.0*  --   HGB 12.2* 10.3*  HCT 37.3* 31.0*  MCV 90.8 89.3  PLT 261 238   Cardiac Enzymes: No results for input(s): CKTOTAL, CKMB, CKMBINDEX, TROPONINI in the last 72 hours. BNP: Invalid input(s): POCBNP D-Dimer: No results for input(s): DDIMER in the last 72 hours. Hemoglobin A1C: Recent Labs    08/05/21 1502  HGBA1C 7.0*   Fasting  Lipid Panel: Recent Labs    08/06/21 0427  CHOL 120  HDL 40*  LDLCALC 52  TRIG 10/06/21  CHOLHDL 3.0   Thyroid Function Tests: No results for input(s): TSH, T4TOTAL, T3FREE, THYROIDAB in the last 72 hours.  Invalid input(s): FREET3 Anemia Panel: No results for input(s): VITAMINB12, FOLATE, FERRITIN, TIBC, IRON, RETICCTPCT in the last 72 hours.  Imaging: CT HEAD WO CONTRAST (893)  Result Date: 08/05/2021 CLINICAL DATA:  Memory and speech issues with neck pain and swelling after tree limb fell on head. EXAM: CT HEAD WITHOUT CONTRAST CT CERVICAL SPINE WITHOUT CONTRAST TECHNIQUE: Multidetector CT imaging of the head and cervical spine was performed following the standard protocol without intravenous contrast. Multiplanar CT image reconstructions of the cervical spine were also generated. RADIATION DOSE REDUCTION: This exam was performed according to the departmental dose-optimization program which includes automated exposure control, adjustment of the mA and/or kV according to patient size and/or use of iterative reconstruction technique. COMPARISON:  None Available. FINDINGS: CT HEAD FINDINGS Brain: There is no evidence for acute hemorrhage, hydrocephalus, mass lesion, or abnormal extra-axial fluid collection. No definite CT evidence for acute infarction. Vascular: No hyperdense vessel or unexpected calcification. Skull: No evidence for fracture. No worrisome lytic or sclerotic lesion.  Sinuses/Orbits: Chronic mucosal disease noted ethmoid air cells and right maxillary sinus Visualized portions of the globes and intraorbital fat are unremarkable. Other: None. CT CERVICAL SPINE FINDINGS Alignment: Normal. Skull base and vertebrae: No acute fracture. No primary bone lesion or focal pathologic process. Soft tissues and spinal canal: No prevertebral fluid or swelling. No visible canal hematoma. Disc levels:  Loss of disc height noted C6-7. Upper chest: Unremarkable. Other: None. IMPRESSION: 1. No acute  intracranial abnormality. 2. No cervical spine fracture or subluxation. Electronically Signed   By: Kennith CenterEric  Mansell M.D.   On: 08/05/2021 16:02   CT CERVICAL SPINE WO CONTRAST  Result Date: 08/05/2021 CLINICAL DATA:  Memory and speech issues with neck pain and swelling after tree limb fell on head. EXAM: CT HEAD WITHOUT CONTRAST CT CERVICAL SPINE WITHOUT CONTRAST TECHNIQUE: Multidetector CT imaging of the head and cervical spine was performed following the standard protocol without intravenous contrast. Multiplanar CT image reconstructions of the cervical spine were also generated. RADIATION DOSE REDUCTION: This exam was performed according to the departmental dose-optimization program which includes automated exposure control, adjustment of the mA and/or kV according to patient size and/or use of iterative reconstruction technique. COMPARISON:  None Available. FINDINGS: CT HEAD FINDINGS Brain: There is no evidence for acute hemorrhage, hydrocephalus, mass lesion, or abnormal extra-axial fluid collection. No definite CT evidence for acute infarction. Vascular: No hyperdense vessel or unexpected calcification. Skull: No evidence for fracture. No worrisome lytic or sclerotic lesion. Sinuses/Orbits: Chronic mucosal disease noted ethmoid air cells and right maxillary sinus Visualized portions of the globes and intraorbital fat are unremarkable. Other: None. CT CERVICAL SPINE FINDINGS Alignment: Normal. Skull base and vertebrae: No acute fracture. No primary bone lesion or focal pathologic process. Soft tissues and spinal canal: No prevertebral fluid or swelling. No visible canal hematoma. Disc levels:  Loss of disc height noted C6-7. Upper chest: Unremarkable. Other: None. IMPRESSION: 1. No acute intracranial abnormality. 2. No cervical spine fracture or subluxation. Electronically Signed   By: Kennith CenterEric  Mansell M.D.   On: 08/05/2021 16:02   DG Chest Port 1 View  Result Date: 08/05/2021 CLINICAL DATA:  Chest pain. EXAM:  PORTABLE CHEST 1 VIEW COMPARISON:  None Available. FINDINGS: The heart is mildly enlarged. Both lungs are clear. The visualized skeletal structures are unremarkable. IMPRESSION: No active disease. Electronically Signed   By: Larose HiresImran  Ahmed D.O.   On: 08/05/2021 13:45    Cardiac Studies:  ECG: SR rate 86 ICRBBB T inversions V1-3    Telemetry:  NSR 08/06/2021   Echo: pending   Medications:    aspirin EC  81 mg Oral Q breakfast   diphenhydrAMINE  25 mg Oral QHS   famotidine  20 mg Oral BID   folic acid  1 mg Oral Daily   metoprolol tartrate  50 mg Oral BID   multivitamin with minerals  1 tablet Oral Daily   rosuvastatin  20 mg Oral Daily   sertraline  100 mg Oral Daily   sodium chloride flush  3 mL Intravenous Q12H   sodium chloride flush  3 mL Intravenous Q12H   thiamine  100 mg Oral Daily   Or   thiamine  100 mg Intravenous Daily      sodium chloride     heparin 1,500 Units/hr (08/06/21 0706)    Assessment/Plan:   Chest Pain:  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 If EF down will consider cath  ETOH:  LFTls mildly elevated discussed moderation Rash:  on benadryl less erythema plan per primary service DM:  insulin pump A1c elevated   Charlton Haws 08/06/2021, 8:21 AM

## 2021-08-06 NOTE — Progress Notes (Addendum)
Inpatient Diabetes Program Recommendations  AACE/ADA: New Consensus Statement on Inpatient Glycemic Control (2015)  Target Ranges:  Prepandial:   less than 140 mg/dL      Peak postprandial:   less than 180 mg/dL (1-2 hours)      Critically ill patients:  140 - 180 mg/dL   Lab Results  Component Value Date   GLUCAP 135 (H) 08/06/2021   HGBA1C 7.0 (H) 08/05/2021    Review of Glycemic Control  Diabetes history: DM1 Outpatient Diabetes medications:  Omnipod with Dexcom Current orders for Inpatient glycemic control: none  Inpatient Diabetes Program Recommendations:    Please place Insulin Pump Therapy order set and print contract and flow sheet.  Noted at 01:40 this am patients Dexcom read 55 mg/dL.  RN noted he turned off auto mode and this will stop him from receiving insulin while NPO.  This is not correct.  By turning off auto mode, the pump will not self adjust based on glucose.  He still receives continuous basal insulin per pump settings.  He could set pump to exercise mode which increases his target BG to 150 mg/dL.    Attempted to call pt-no answer.  Spoke with Delorise Jackson, LPN.  Will continue to follow while inpatient.  Thank you, Dulce Sellar, MSN, CDCES Diabetes Coordinator Inpatient Diabetes Program [phone] (team pager from 8a-5p)

## 2021-08-06 NOTE — Progress Notes (Signed)
PROGRESS NOTE    Jesse PaiBradley E Rodgers  UEA:[id number] DOB: 07/07/1975 DOA: 08/05/2021 PCP: Jesse Rodgers, [facility]    Brief Narrative:  46 year old male with a history of type 1 diabetes on insulin pump, hypertension, hyperlipidemia, presents to the hospital also with chest discomfort as well as rash on his neck and back.  Cardiology following and plans are for stress test on 6/8.  Etiology of rash is not entirely clear.   Assessment & Plan:   Principal Problem:   Chest pain Active Problems:   Elevated troponin   Diabetes mellitus type 1 (HCC)   Elevated LFTs   Essential hypertension, benign   Mixed hyperlipidemia   Depression   Current smoker   Rash   Chest pain -Atypical -Noted to have elevated troponin -Echocardiogram ordered today, shows normal EF and no wall motion abnormalities -Currently on aspirin, heparin and beta-blocker -Plans for stress test in a.m.  Rash -Noted to have papular rash over neck and back -Etiology is unclear -Reports having an insect bite approximately 2 weeks ago prior to onset -He also did complete a course of Bactrim and Keflex -Checking tickborne illness serologies -Unclear if this is erythema multiforme from Bactrim -Started on a course of doxycycline -Using Benadryl as needed for pruritus -Scheduled him for follow-up with dermatology on 6/9 at 9 AM  Diabetes mellitus type 1 -Chronically on insulin pump -Patient wishes to hold further insulin by pump for the remainder of the hospital stay -Started on insulin glargine and sliding scale  Headache -Noted to have transient diplopia with headache yesterday -Has had similar episodes in the past -CT head on admission unremarkable -We will check MRI brain -If imaging unrevealing, will need outpatient ophthalmology evaluation with possible neuro evaluation for ocular migraines. -Did report some improvement after receiving Reglan/Benadryl/Toradol yesterday  Depression -Continue  Zoloft  Hyperlipidemia -Continue statin  Hypertension -Continue on metoprolol   DVT prophylaxis: SCDs Start: 08/05/21 1449 Place TED hose Start: 08/05/21 1449  Code Status: Full code Family Communication: Discussed with family present at bedside Disposition Plan: Status is: Inpatient Remains inpatient appropriate because: Pending further cardiac work-up     Consultants:  Cardiology  Procedures:  Echocardiogram  Antimicrobials:      Subjective: Reports that rash has worsened over the past 2 to 3 days.  He had an insect bite (did not see what had bitten) approximately 2 weeks ago.  He had completed a 10-day course of Keflex and Bactrim  Objective: Vitals:   08/06/21 0028 08/06/21 0407 08/06/21 0820 08/06/21 1531  BP: 93/61 103/65 116/61 119/64  Pulse: 74 84 88 93  Resp: 20 20  20   Temp: 98 F (36.7 C) 99.3 F (37.4 C)  99.7 F (37.6 C)  TempSrc: Oral Oral  Oral  SpO2: 96% 96%  98%  Weight:      Height:        Intake/Output Summary (Last 24 hours) at 08/06/2021 1958 Last data filed at 08/06/2021 0500 Gross per 24 hour  Intake 131.84 ml  Output --  Net 131.84 ml   Filed Weights   08/05/21 1138 08/05/21 1659  Weight: 104.3 kg 105.3 kg    Examination:  General exam: Appears calm and comfortable  Respiratory system: Clear to auscultation. Respiratory effort normal. Cardiovascular system: S1 & S2 heard, RRR. No JVD, murmurs, rubs, gallops or clicks. No pedal edema. Gastrointestinal system: Abdomen is nondistended, soft and nontender. No organomegaly or masses felt. Normal bowel sounds heard. Central nervous system: Alert and oriented. No  focal neurological deficits. Extremities: Symmetric 5 x 5 power. Skin: Rash as pictured below Psychiatry: Judgement and insight appear normal. Mood & affect appropriate.        Data Reviewed: I have personally reviewed following labs and imaging studies  CBC: Recent Labs  Lab 08/05/21 1205 08/06/21 0427  WBC  10.9* 9.4  NEUTROABS 9.0*  --   HGB 12.2* 10.3*  HCT 37.3* 31.0*  MCV 90.8 89.3  PLT 261 237   Basic Metabolic Panel: Recent Labs  Lab 08/05/21 1205 08/06/21 0427  NA 136 134*  K 4.6 4.5  CL 100 102  CO2 25 25  GLUCOSE 182* 134*  BUN 17 24*  CREATININE 1.05 1.11  CALCIUM 9.0 8.3*   GFR: Estimated Creatinine Clearance: 108.4 mL/min (by C-G formula based on SCr of 1.11 mg/dL). Liver Function Tests: Recent Labs  Lab 08/05/21 1205  AST 57*  ALT 66*  ALKPHOS 131*  BILITOT 1.4*  PROT 7.7  ALBUMIN 3.6   No results for input(s): LIPASE, AMYLASE in the last 168 hours. No results for input(s): AMMONIA in the last 168 hours. Coagulation Profile: No results for input(s): INR, PROTIME in the last 168 hours. Cardiac Enzymes: No results for input(s): CKTOTAL, CKMB, CKMBINDEX, TROPONINI in the last 168 hours. BNP (last 3 results) No results for input(s): PROBNP in the last 8760 hours. HbA1C: Recent Labs    08/05/21 1502  HGBA1C 7.0*   CBG: Recent Labs  Lab 08/05/21 2032 08/06/21 0035 08/06/21 0751 08/06/21 1154 08/06/21 1639  GLUCAP 318* 74 135* 112* 183*   Lipid Profile: Recent Labs    08/06/21 0427  CHOL 120  HDL 40*  LDLCALC 52  TRIG 138  CHOLHDL 3.0   Thyroid Function Tests: Recent Labs    08/06/21 0427  TSH 2.671   Anemia Panel: No results for input(s): VITAMINB12, FOLATE, FERRITIN, TIBC, IRON, RETICCTPCT in the last 72 hours. Sepsis Labs: Recent Labs  Lab 08/05/21 1232 08/05/21 1405  LATICACIDVEN 0.8 1.0    Recent Results (from the past 240 hour(s))  Culture, blood (Routine X 2) w Reflex to ID Panel     Status: None (Preliminary result)   Collection Time: 08/05/21  6:12 PM   Specimen: BLOOD RIGHT HAND  Result Value Ref Range Status   Specimen Description BLOOD RIGHT HAND  Final   Special Requests   Final    BOTTLES DRAWN AEROBIC AND ANAEROBIC Blood Culture results may not be optimal due to an excessive volume of blood received in  culture bottles   Culture   Final    NO GROWTH < 12 HOURS Performed at [facility], [address] [place], [place] [address]    Report Status PENDING  Incomplete  Culture, blood (Routine X 2) w Reflex to ID Panel     Status: None (Preliminary result)   Collection Time: 08/05/21  6:12 PM   Specimen: Right Antecubital; Blood  Result Value Ref Range Status   Specimen Description RIGHT ANTECUBITAL  Final   Special Requests   Final    BOTTLES DRAWN AEROBIC AND ANAEROBIC Blood Culture results may not be optimal due to an excessive volume of blood received in culture bottles   Culture   Final    NO GROWTH < 12 HOURS Performed at [facility], [address] [place], [place] [address]    Report Status PENDING  Incomplete         Radiology Studies: CT HEAD WO CONTRAST (5MM)  Result Date: 08/05/2021 CLINICAL  DATA:  Memory and speech issues with neck pain and swelling after tree limb fell on head. EXAM: CT HEAD WITHOUT CONTRAST CT CERVICAL SPINE WITHOUT CONTRAST TECHNIQUE: Multidetector CT imaging of the head and cervical spine was performed following the standard protocol without intravenous contrast. Multiplanar CT image reconstructions of the cervical spine were also generated. RADIATION DOSE REDUCTION: This exam was performed according to the departmental dose-optimization program which includes automated exposure control, adjustment of the mA and/or kV according to patient size and/or use of iterative reconstruction technique. COMPARISON:  None Available. FINDINGS: CT HEAD FINDINGS Brain: There is no evidence for acute hemorrhage, hydrocephalus, mass lesion, or abnormal extra-axial fluid collection. No definite CT evidence for acute infarction. Vascular: No hyperdense vessel or unexpected calcification. Skull: No evidence for fracture. No worrisome lytic or sclerotic lesion. Sinuses/Orbits: Chronic mucosal disease noted ethmoid air cells and right maxillary sinus Visualized portions of the  globes and intraorbital fat are unremarkable. Other: None. CT CERVICAL SPINE FINDINGS Alignment: Normal. Skull base and vertebrae: No acute fracture. No primary bone lesion or focal pathologic process. Soft tissues and spinal canal: No prevertebral fluid or swelling. No visible canal hematoma. Disc levels:  Loss of disc height noted C6-7. Upper chest: Unremarkable. Other: None. IMPRESSION: 1. No acute intracranial abnormality. 2. No cervical spine fracture or subluxation. Electronically Signed   By: Kennith Center M.D.   On: 08/05/2021 16:02   CT CERVICAL SPINE WO CONTRAST  Result Date: 08/05/2021 CLINICAL DATA:  Memory and speech issues with neck pain and swelling after tree limb fell on head. EXAM: CT HEAD WITHOUT CONTRAST CT CERVICAL SPINE WITHOUT CONTRAST TECHNIQUE: Multidetector CT imaging of the head and cervical spine was performed following the standard protocol without intravenous contrast. Multiplanar CT image reconstructions of the cervical spine were also generated. RADIATION DOSE REDUCTION: This exam was performed according to the departmental dose-optimization program which includes automated exposure control, adjustment of the mA and/or kV according to patient size and/or use of iterative reconstruction technique. COMPARISON:  None Available. FINDINGS: CT HEAD FINDINGS Brain: There is no evidence for acute hemorrhage, hydrocephalus, mass lesion, or abnormal extra-axial fluid collection. No definite CT evidence for acute infarction. Vascular: No hyperdense vessel or unexpected calcification. Skull: No evidence for fracture. No worrisome lytic or sclerotic lesion. Sinuses/Orbits: Chronic mucosal disease noted ethmoid air cells and right maxillary sinus Visualized portions of the globes and intraorbital fat are unremarkable. Other: None. CT CERVICAL SPINE FINDINGS Alignment: Normal. Skull base and vertebrae: No acute fracture. No primary bone lesion or focal pathologic process. Soft tissues and spinal  canal: No prevertebral fluid or swelling. No visible canal hematoma. Disc levels:  Loss of disc height noted C6-7. Upper chest: Unremarkable. Other: None. IMPRESSION: 1. No acute intracranial abnormality. 2. No cervical spine fracture or subluxation. Electronically Signed   By: Kennith Center M.D.   On: 08/05/2021 16:02   MR BRAIN WO CONTRAST  Result Date: 08/06/2021 CLINICAL DATA:  Neuro deficit, acute, stroke suspected EXAM: MRI HEAD WITHOUT CONTRAST TECHNIQUE: Multiplanar, multiecho pulse sequences of the brain and surrounding structures were obtained without intravenous contrast. COMPARISON:  None Available. FINDINGS: Brain: There is no acute infarction or intracranial hemorrhage. There is no intracranial mass, mass effect, or edema. There is no hydrocephalus or extra-axial fluid collection. Ventricles and sulci are within normal limits in size and configuration. Few small scattered foci of T2 hyperintensity in the supratentorial white matter likely reflect minimal nonspecific gliosis/demyelination of doubtful significance. Vascular: Major vessel flow voids  at the skull base are preserved. Skull and upper cervical spine: Normal marrow signal is preserved. Sinuses/Orbits: Patchy mild mucosal thickening. Orbits are unremarkable. Other: Sella is unremarkable.  Mastoid air cells are clear. IMPRESSION: No evidence of recent infarction, hemorrhage, or mass. Electronically Signed   By: Guadlupe Spanish M.D.   On: 08/06/2021 17:42   DG Chest Port 1 View  Result Date: 08/05/2021 CLINICAL DATA:  Chest pain. EXAM: PORTABLE CHEST 1 VIEW COMPARISON:  None Available. FINDINGS: The heart is mildly enlarged. Both lungs are clear. The visualized skeletal structures are unremarkable. IMPRESSION: No active disease. Electronically Signed   By: Larose Hires D.O.   On: 08/05/2021 13:45   ECHOCARDIOGRAM COMPLETE  Result Date: 08/06/2021    ECHOCARDIOGRAM REPORT   Patient Name:   TYRIQUE SPORN Date of Exam: 08/06/2021 Medical  Rec #:  [id number]        Height:       76.0 in Accession #:    [id number]       Weight:       232.1 lb Date of Birth:  1975/09/27        BSA:          2.360 m Patient Age:    46 years         BP:           103/65 mmHg Patient Gender: M                HR:           80 bpm. [facility] Procedure: 2D Echo, Cardiac Doppler and Color Doppler Indications:    Chest Pain  History:        Patient has no prior history of Echocardiogram examinations.                 Signs/Symptoms:Chest Pain; Risk Factors:Hypertension, Diabetes,                 Dyslipidemia and Current Smoker.  Sonographer:    Mikki Harbor Referring Phys: [id number] COURAGE EMOKPAE IMPRESSIONS  1. Left ventricular ejection fraction, by estimation, is 55 to 60%. The left ventricle has normal function. The left ventricle has no regional wall motion abnormalities. There is mild left ventricular hypertrophy. Left ventricular diastolic parameters were normal.  2. Right ventricular systolic function is normal. The right ventricular size is normal. There is normal pulmonary artery systolic pressure.  3. Left atrial size was moderately dilated.  4. The mitral valve is abnormal. Trivial mitral valve regurgitation. No evidence of mitral stenosis.  5. The aortic valve is tricuspid. Aortic valve regurgitation is not visualized. No aortic stenosis is present.  6. The inferior vena cava is normal in size with greater than 50% respiratory variability, suggesting right atrial pressure of 3 mmHg. FINDINGS  Left Ventricle: Left ventricular ejection fraction, by estimation, is 55 to 60%. The left ventricle has normal function. The left ventricle has no regional wall motion abnormalities. The left ventricular internal cavity size was normal in size. There is  mild left ventricular hypertrophy. Left ventricular diastolic parameters were normal. Right Ventricle: The right ventricular size is normal. No increase in right ventricular wall thickness. Right ventricular  systolic function is normal. There is normal pulmonary artery systolic pressure. The tricuspid regurgitant velocity is 2.40 m/s, and  with an assumed right atrial pressure of 3 mmHg, the estimated right ventricular systolic pressure is 26.0 mmHg. Left Atrium: Left atrial size was moderately dilated. Right  Atrium: Right atrial size was normal in size. Pericardium: There is no evidence of pericardial effusion. Mitral Valve: The mitral valve is abnormal. There is mild thickening of the mitral valve leaflet(s). Trivial mitral valve regurgitation. No evidence of mitral valve stenosis. MV peak gradient, 6.8 mmHg. The mean mitral valve gradient is 2.0 mmHg. Tricuspid Valve: The tricuspid valve is normal in structure. Tricuspid valve regurgitation is trivial. No evidence of tricuspid stenosis. Aortic Valve: The aortic valve is tricuspid. Aortic valve regurgitation is not visualized. No aortic stenosis is present. Aortic valve mean gradient measures 3.0 mmHg. Aortic valve peak gradient measures 4.6 mmHg. Aortic valve area, by VTI measures 3.07 cm. Pulmonic Valve: The pulmonic valve was normal in structure. Pulmonic valve regurgitation is not visualized. No evidence of pulmonic stenosis. Aorta: The aortic root is normal in size and structure. Venous: The inferior vena cava is normal in size with greater than 50% respiratory variability, suggesting right atrial pressure of 3 mmHg. IAS/Shunts: No atrial level shunt detected by color flow Doppler.  LEFT VENTRICLE PLAX 2D LVIDd:         5.10 cm      Diastology LVIDs:         3.40 cm      LV e' medial:    7.72 cm/s LV PW:         1.40 cm      LV E/e' medial:  14.1 LV IVS:        1.40 cm      LV e' lateral:   7.83 cm/s LVOT diam:     2.10 cm      LV E/e' lateral: 13.9 LV SV:         75 LV SV Index:   32 LVOT Area:     3.46 cm  LV Volumes (MOD) LV vol d, MOD A2C: 82.7 ml LV vol d, MOD A4C: 100.0 ml LV vol s, MOD A2C: 37.4 ml LV vol s, MOD A4C: 39.7 ml LV SV MOD A2C:     45.3 ml LV  SV MOD A4C:     100.0 ml LV SV MOD BP:      52.4 ml RIGHT VENTRICLE RV Basal diam:  3.40 cm RV Mid diam:    3.70 cm RV S prime:     9.68 cm/s TAPSE (M-mode): 2.3 cm LEFT ATRIUM             Index        RIGHT ATRIUM           Index LA diam:        4.50 cm 1.91 cm/m   RA Area:     19.10 cm LA Vol (A2C):   75.6 ml 32.03 ml/m  RA Volume:   49.20 ml  20.85 ml/m LA Vol (A4C):   64.8 ml 27.46 ml/m LA Biplane Vol: 75.2 ml 31.86 ml/m  AORTIC VALVE                    PULMONIC VALVE AV Area (Vmax):    2.99 cm     PV Vmax:       0.56 m/s AV Area (Vmean):   2.69 cm     PV Peak grad:  1.2 mmHg AV Area (VTI):     3.07 cm AV Vmax:           107.00 cm/s AV Vmean:          80.900 cm/s AV VTI:  0.245 m AV Peak Grad:      4.6 mmHg AV Mean Grad:      3.0 mmHg LVOT Vmax:         92.40 cm/s LVOT Vmean:        62.900 cm/s LVOT VTI:          0.217 m LVOT/AV VTI ratio: 0.89  AORTA Ao Root diam: 3.50 cm Ao Asc diam:  3.30 cm MITRAL VALVE                TRICUSPID VALVE MV Area (PHT): 3.91 cm     TR Peak grad:   23.0 mmHg MV Area VTI:   2.69 cm     TR Vmax:        240.00 cm/s MV Peak grad:  6.8 mmHg MV Mean grad:  2.0 mmHg     SHUNTS MV Vmax:       1.30 m/s     Systemic VTI:  0.22 m MV Vmean:      61.2 cm/s    Systemic Diam: 2.10 cm MV Decel Time: 194 msec MV E velocity: 109.00 cm/s MV A velocity: 69.00 cm/s MV E/A ratio:  1.58 Jenkins Rouge MD Electronically signed by Jenkins Rouge MD Signature Date/Time: 08/06/2021/9:37:15 AM    Final         Scheduled Meds:  aspirin EC  81 mg Oral Q breakfast   diphenhydrAMINE  25 mg Oral QHS   doxycycline  100 mg Oral Q12H   famotidine  20 mg Oral BID   folic acid  1 mg Oral Daily   [START ON 08/07/2021] insulin aspart  0-9 Units Subcutaneous TID WC   insulin glargine-yfgn  25 Units Subcutaneous Daily   metoprolol tartrate  50 mg Oral BID   multivitamin with minerals  1 tablet Oral Daily   [START ON 08/08/2021] regadenoson  0.4 mg Intravenous Once   rosuvastatin  20 mg Oral  Daily   sertraline  100 mg Oral Daily   sodium chloride flush  3 mL Intravenous Q12H   sodium chloride flush  3 mL Intravenous Q12H   thiamine  100 mg Oral Daily   Or   thiamine  100 mg Intravenous Daily   Continuous Infusions:  sodium chloride     heparin 2,100 Units/hr (08/06/21 1522)     LOS: 0 days    Time spent: 35 mins    Kathie Dike, MD [facility]   If 7PM-7AM, please contact night-coverage [email or web address]  08/06/2021, 7:58 PM

## 2021-08-07 ENCOUNTER — Encounter (HOSPITAL_COMMUNITY): Payer: Self-pay | Admitting: Internal Medicine

## 2021-08-07 ENCOUNTER — Inpatient Hospital Stay (HOSPITAL_COMMUNITY): Payer: BC Managed Care – PPO

## 2021-08-07 DIAGNOSIS — R079 Chest pain, unspecified: Secondary | ICD-10-CM

## 2021-08-07 DIAGNOSIS — R7989 Other specified abnormal findings of blood chemistry: Secondary | ICD-10-CM

## 2021-08-07 DIAGNOSIS — I248 Other forms of acute ischemic heart disease: Secondary | ICD-10-CM

## 2021-08-07 LAB — CBC
HCT: 31.5 % — ABNORMAL LOW (ref 39.0–52.0)
Hemoglobin: 10.5 g/dL — ABNORMAL LOW (ref 13.0–17.0)
MCH: 29.6 pg (ref 26.0–34.0)
MCHC: 33.3 g/dL (ref 30.0–36.0)
MCV: 88.7 fL (ref 80.0–100.0)
Platelets: 273 10*3/uL (ref 150–400)
RBC: 3.55 MIL/uL — ABNORMAL LOW (ref 4.22–5.81)
RDW: 11.9 % (ref 11.5–15.5)
WBC: 7.8 10*3/uL (ref 4.0–10.5)
nRBC: 0 % (ref 0.0–0.2)

## 2021-08-07 LAB — BASIC METABOLIC PANEL
Anion gap: 8 (ref 5–15)
BUN: 20 mg/dL (ref 6–20)
CO2: 24 mmol/L (ref 22–32)
Calcium: 8.4 mg/dL — ABNORMAL LOW (ref 8.9–10.3)
Chloride: 104 mmol/L (ref 98–111)
Creatinine, Ser: 0.99 mg/dL (ref 0.61–1.24)
GFR, Estimated: >60 mL/min (ref >60)
Glucose, Bld: 223 mg/dL — ABNORMAL HIGH (ref 70–99)
Potassium: 4.5 mmol/L (ref 3.5–5.1)
Sodium: 136 mmol/L (ref 135–145)

## 2021-08-07 LAB — NM MYOCAR MULTI W/SPECT W/WALL MOTION / EF
LV dias vol: 141 mL (ref 62–150)
LV sys vol: 51 mL
Nuc Stress EF: 64 %
Peak HR: 101 {beats}/min
RATE: 0.4
Rest HR: 85 {beats}/min
Rest Nuclear Isotope Dose: 11 mCi
SDS: 2
SRS: 0
SSS: 2
ST Depression (mm): 0 mm
Stress Nuclear Isotope Dose: 32.1 mCi
TID: 1.17

## 2021-08-07 LAB — GLUCOSE, CAPILLARY
Glucose-Capillary: 221 mg/dL — ABNORMAL HIGH (ref 70–99)
Glucose-Capillary: 304 mg/dL — ABNORMAL HIGH (ref 70–99)

## 2021-08-07 LAB — HEPARIN LEVEL (UNFRACTIONATED)
Heparin Unfractionated: 0.41 IU/mL (ref 0.30–0.70)
Heparin Unfractionated: 0.23 IU/mL — ABNORMAL LOW (ref 0.30–0.70)

## 2021-08-07 MED ORDER — TECHNETIUM TC 99M TETROFOSMIN IV KIT
10.0000 | PACK | Freq: Once | INTRAVENOUS | Status: AC | PRN
  Administered 2021-08-07: 11 via INTRAVENOUS

## 2021-08-07 MED ORDER — DIPHENHYDRAMINE HCL 25 MG PO CAPS: 25.0000 mg | ORAL_CAPSULE | Freq: Four times a day (QID) | ORAL | 0 refills | Status: DC | PRN

## 2021-08-07 MED ORDER — INSULIN ASPART 100 UNIT/ML IJ SOLN
5.0000 [IU] | Freq: Once | INTRAMUSCULAR | Status: AC
Start: 2021-08-07 — End: 2021-08-07
  Administered 2021-08-07: 5 [IU] via SUBCUTANEOUS

## 2021-08-07 MED ORDER — ASPIRIN 81 MG PO TBEC: 81.0000 mg | DELAYED_RELEASE_TABLET | Freq: Every day | ORAL | 12 refills | Status: DC

## 2021-08-07 MED ORDER — SODIUM CHLORIDE FLUSH 0.9 % IV SOLN
INTRAVENOUS | Status: AC
  Administered 2021-08-07: 10 mL
  Filled 2021-08-07: qty 10

## 2021-08-07 MED ORDER — REGADENOSON 0.4 MG/5ML IV SOLN
INTRAVENOUS | Status: AC
  Administered 2021-08-07: 0.4 mg
  Filled 2021-08-07: qty 5

## 2021-08-07 MED ORDER — SERTRALINE HCL 100 MG PO TABS: 100.0000 mg | ORAL_TABLET | Freq: Every day | ORAL | 0 refills | Status: DC

## 2021-08-07 MED ORDER — METOPROLOL TARTRATE 25 MG PO TABS: 25.0000 mg | ORAL_TABLET | Freq: Two times a day (BID) | ORAL | 1 refills | Status: DC

## 2021-08-07 MED ORDER — DOXYCYCLINE HYCLATE 100 MG PO TABS: 100.0000 mg | ORAL_TABLET | Freq: Two times a day (BID) | ORAL | 0 refills | Status: AC

## 2021-08-07 MED ORDER — TECHNETIUM TC 99M TETROFOSMIN IV KIT
30.0000 | PACK | Freq: Once | INTRAVENOUS | Status: AC | PRN
  Administered 2021-08-07: 32.1 via INTRAVENOUS

## 2021-08-07 NOTE — Discharge Summary (Signed)
\ Physician Discharge Summary  Jesse Rodgers YWV:[id number] DOB: 1975/05/20 DOA: 08/05/2021  PCP: [facility]  Admit date: 08/05/2021 Discharge date: 08/07/2021  Admitted From: Home Disposition: Home  Recommendations for Outpatient Follow-up:  Follow up with PCP in 1-2 weeks Please obtain BMP/CBC in one week Follow-up with cardiology has been scheduled He has an appointment to see dermatology on 6/9   Discharge Condition: Stable CODE STATUS: Full code Diet recommendation: Carb modified  Brief/Interim Summary: 46 year old male with a history of type 1 diabetes on insulin pump, hypertension, hyperlipidemia, presents to the hospital also with chest discomfort as well as rash on his neck and back.  Noted to have mild elevation of troponin.  Echocardiogram unremarkable.  Seen by cardiology and underwent stress test that was found to be low risk study.  Etiology of rash is not entirely clear.  Discharge Diagnoses:  Principal Problem:   Chest pain Active Problems:   Elevated troponin   Diabetes mellitus type 1 (HCC)   Elevated LFTs   Essential hypertension, benign   Mixed hyperlipidemia   Depression   Current smoker   Rash  Chest pain -Atypical -Noted to have mildly elevated troponin, likely demand ischemia -Echocardiogram showed normal EF and no wall motion abnormalities -Treated with aspirin, beta-blocker and IV heparin -Stress test performed noted to be a lower study -Plans to be to follow-up with cardiology as an outpatient in 4 to 6 weeks   Rash -Noted to have papular rash over neck and back -Etiology is unclear -Reports having an insect bite approximately 2 weeks ago prior to onset -He also did complete a course of Bactrim and Keflex -Tickborne illness serologies were sent and are currently in process -Unclear if this is erythema multiforme from Bactrim -Started on a course of doxycycline -Using Benadryl as needed for pruritus -Scheduled him for  follow-up with dermatology on 6/9 at 9 AM   Diabetes mellitus type 1 -Chronically on insulin pump -Resume insulin pump on discharge   Headache -Noted to have transient diplopia with headache yesterday -Has had similar episodes in the past -CT head on admission unremarkable -MRI brain unremarkable -If imaging unrevealing, will need outpatient ophthalmology evaluation with possible neuro evaluation for ocular migraines. -Did report some improvement after receiving Reglan/Benadryl/Toradol    Depression -Continue Zoloft   Hyperlipidemia -Continue statin   Hypertension -Continue on metoprolol  Discharge Instructions  Discharge Instructions     Diet - low sodium heart healthy   Complete by: As directed    Increase activity slowly   Complete by: As directed       Allergies as of 08/07/2021   No Known Allergies      Medication List     STOP taking these medications    Tresiba FlexTouch 100 UNIT/ML FlexTouch Pen Generic drug: insulin degludec       TAKE these medications    aspirin EC 81 MG tablet Take 1 tablet (81 mg total) by mouth daily with breakfast. Swallow whole. Start taking on: August 08, 2021   Dexcom G6 Receiver Kerrin Mo Use to check glucose four times daily as directed.   Dexcom G6 Sensor Misc Apply new sensor every 10 days as directed.   Dexcom G6 Transmitter Misc USE TO TEST BLOOD GLUCOSE 4-5 TIMES DAILY   diphenhydrAMINE 25 mg capsule Commonly known as: BENADRYL Take 1 capsule (25 mg total) by mouth every 6 (six) hours as needed for itching.   doxycycline 100 MG tablet Commonly known as: VIBRA-TABS Take 1  tablet (100 mg total) by mouth every 12 (twelve) hours for 7 days.   FREESTYLE TEST STRIPS test strip Generic drug: glucose blood Use as instructed to monitor glucose 4 times daily, before breakfast and before bed.   furosemide 20 MG tablet Commonly known as: LASIX Take 20 mg by mouth daily as needed.   Glucagon Emergency 1 MG Kit Inject  1 mg into the vein once as needed.   insulin aspart 100 UNIT/ML injection Commonly known as: NovoLOG Use with Omnipod for TDD around 80 units daily   losartan 25 MG tablet Commonly known as: COZAAR Take 1 tablet (25 mg total) by mouth daily.   metoprolol tartrate 25 MG tablet Commonly known as: LOPRESSOR Take 1 tablet (25 mg total) by mouth 2 (two) times daily.   multivitamin with minerals Tabs tablet Take 1 tablet by mouth daily.   NON FORMULARY Taking Total Beet product daily   Omnipod 5 G6 Intro (Gen 5) Kit Change pod every 48-72 hrs   Omnipod 5 G6 Pod (Gen 5) Misc Change pod every 48-72 hrs   rosuvastatin 20 MG tablet Commonly known as: Crestor Take 1 tablet (20 mg total) by mouth daily.   sertraline 100 MG tablet Commonly known as: ZOLOFT Take 1 tablet (100 mg total) by mouth daily.        Follow-up Information     [facility] Follow up.   Specialty: Cardiology Why: Hospital Follow-up with Dr. Johnsie Cancel on 09/03/2021 at 10:15 AM. Contact information: [place]        Allyn Kenner, MD Follow up on 08/08/2021.   Specialty: Dermatology Why: 9:00am for rash Contact information: [address] [place] [address] [phone]                No Known Allergies  Consultations: Cardiology   Procedures/Studies: NM Myocar Multi W/Spect W/Wall Motion / EF  Result Date: 08/07/2021   Findings are consistent with no clear ischemia. The study is low risk.   No ST deviation was noted. The ECG was negative for ischemia.   LV perfusion is equivocal.  There is a small, mild intensity, fixed inferior defect associated with diaphragmatic attenuation.  GI radiotracer uptake noted near the basal inferior wall results in partial reversibility that is likely artifactual.  No large ischemic territories identified.   Left ventricular function is normal. Nuclear stress EF: 64 %.   Image  quality is adequate. Low risk study with evidence of attenuation artifact, no definite ischemic territories, and normal LVEF at 64%.   MR BRAIN WO CONTRAST  Result Date: 08/06/2021 CLINICAL DATA:  Neuro deficit, acute, stroke suspected EXAM: MRI HEAD WITHOUT CONTRAST TECHNIQUE: Multiplanar, multiecho pulse sequences of the brain and surrounding structures were obtained without intravenous contrast. COMPARISON:  None Available. FINDINGS: Brain: There is no acute infarction or intracranial hemorrhage. There is no intracranial mass, mass effect, or edema. There is no hydrocephalus or extra-axial fluid collection. Ventricles and sulci are within normal limits in size and configuration. Few small scattered foci of T2 hyperintensity in the supratentorial white matter likely reflect minimal nonspecific gliosis/demyelination of doubtful significance. Vascular: Major vessel flow voids at the skull base are preserved. Skull and upper cervical spine: Normal marrow signal is preserved. Sinuses/Orbits: Patchy mild mucosal thickening. Orbits are unremarkable. Other: Sella is unremarkable.  Mastoid air cells are clear. IMPRESSION: No evidence of recent infarction, hemorrhage, or mass. Electronically Signed   By: Malachi Carl  Patel M.D.   On: 08/06/2021 17:42   ECHOCARDIOGRAM COMPLETE  Result Date: 08/06/2021    ECHOCARDIOGRAM REPORT   Patient Name:   Jesse Rodgers Date of Exam: 08/06/2021 Medical Rec #:  [id number]        Height:       76.0 in Accession #:    [id number]       Weight:       232.1 lb Date of Birth:  October 09, 1975        BSA:          2.360 m Patient Age:    5 years         BP:           103/65 mmHg Patient Gender: M                HR:           80 bpm. Exam Location:  [facility] Procedure: 2D Echo, Cardiac Doppler and Color Doppler Indications:    Chest Pain  History:        Patient has no prior history of Echocardiogram examinations.                 Signs/Symptoms:Chest Pain; Risk Factors:Hypertension, Diabetes,                  Dyslipidemia and Current Smoker.  Sonographer:    Wenda Low Referring Phys: [place]  1. Left ventricular ejection fraction, by estimation, is 55 to 60%. The left ventricle has normal function. The left ventricle has no regional wall motion abnormalities. There is mild left ventricular hypertrophy. Left ventricular diastolic parameters were normal.  2. Right ventricular systolic function is normal. The right ventricular size is normal. There is normal pulmonary artery systolic pressure.  3. Left atrial size was moderately dilated.  4. The mitral valve is abnormal. Trivial mitral valve regurgitation. No evidence of mitral stenosis.  5. The aortic valve is tricuspid. Aortic valve regurgitation is not visualized. No aortic stenosis is present.  6. The inferior vena cava is normal in size with greater than 50% respiratory variability, suggesting right atrial pressure of 3 mmHg. FINDINGS  Left Ventricle: Left ventricular ejection fraction, by estimation, is 55 to 60%. The left ventricle has normal function. The left ventricle has no regional wall motion abnormalities. The left ventricular internal cavity size was normal in size. There is  mild left ventricular hypertrophy. Left ventricular diastolic parameters were normal. Right Ventricle: The right ventricular size is normal. No increase in right ventricular wall thickness. Right ventricular systolic function is normal. There is normal pulmonary artery systolic pressure. The tricuspid regurgitant velocity is 2.40 m/s, and  with an assumed right atrial pressure of 3 mmHg, the estimated right ventricular systolic pressure is 21.1 mmHg. Left Atrium: Left atrial size was moderately dilated. Right Atrium: Right atrial size was normal in size. Pericardium: There is no evidence of pericardial effusion. Mitral Valve: The mitral valve is abnormal. There is mild thickening of the mitral valve leaflet(s). Trivial mitral valve  regurgitation. No evidence of mitral valve stenosis. MV peak gradient, 6.8 mmHg. The mean mitral valve gradient is 2.0 mmHg. Tricuspid Valve: The tricuspid valve is normal in structure. Tricuspid valve regurgitation is trivial. No evidence of tricuspid stenosis. Aortic Valve: The aortic valve is tricuspid. Aortic valve regurgitation is not visualized. No aortic stenosis is present. Aortic valve mean gradient measures 3.0 mmHg. Aortic valve peak gradient measures 4.6 mmHg. Aortic valve area,  by VTI measures 3.07 cm. Pulmonic Valve: The pulmonic valve was normal in structure. Pulmonic valve regurgitation is not visualized. No evidence of pulmonic stenosis. Aorta: The aortic root is normal in size and structure. Venous: The inferior vena cava is normal in size with greater than 50% respiratory variability, suggesting right atrial pressure of 3 mmHg. IAS/Shunts: No atrial level shunt detected by color flow Doppler.  LEFT VENTRICLE PLAX 2D LVIDd:         5.10 cm      Diastology LVIDs:         3.40 cm      LV e' medial:    7.72 cm/s LV PW:         1.40 cm      LV E/e' medial:  14.1 LV IVS:        1.40 cm      LV e' lateral:   7.83 cm/s LVOT diam:     2.10 cm      LV E/e' lateral: 13.9 LV SV:         75 LV SV Index:   32 LVOT Area:     3.46 cm  LV Volumes (MOD) LV vol d, MOD A2C: 82.7 ml LV vol d, MOD A4C: 100.0 ml LV vol s, MOD A2C: 37.4 ml LV vol s, MOD A4C: 39.7 ml LV SV MOD A2C:     45.3 ml LV SV MOD A4C:     100.0 ml LV SV MOD BP:      52.4 ml RIGHT VENTRICLE RV Basal diam:  3.40 cm RV Mid diam:    3.70 cm RV S prime:     9.68 cm/s TAPSE (M-mode): 2.3 cm LEFT ATRIUM             Index        RIGHT ATRIUM           Index LA diam:        4.50 cm 1.91 cm/m   RA Area:     19.10 cm LA Vol (A2C):   75.6 ml 32.03 ml/m  RA Volume:   49.20 ml  20.85 ml/m LA Vol (A4C):   64.8 ml 27.46 ml/m LA Biplane Vol: 75.2 ml 31.86 ml/m  AORTIC VALVE                    PULMONIC VALVE AV Area (Vmax):    2.99 cm     PV Vmax:        0.56 m/s AV Area (Vmean):   2.69 cm     PV Peak grad:  1.2 mmHg AV Area (VTI):     3.07 cm AV Vmax:           107.00 cm/s AV Vmean:          80.900 cm/s AV VTI:            0.245 m AV Peak Grad:      4.6 mmHg AV Mean Grad:      3.0 mmHg LVOT Vmax:         92.40 cm/s LVOT Vmean:        62.900 cm/s LVOT VTI:          0.217 m LVOT/AV VTI ratio: 0.89  AORTA Ao Root diam: 3.50 cm Ao Asc diam:  3.30 cm MITRAL VALVE                TRICUSPID VALVE MV Area (PHT): 3.91 cm  TR Peak grad:   23.0 mmHg MV Area VTI:   2.69 cm     TR Vmax:        240.00 cm/s MV Peak grad:  6.8 mmHg MV Mean grad:  2.0 mmHg     SHUNTS MV Vmax:       1.30 m/s     Systemic VTI:  0.22 m MV Vmean:      61.2 cm/s    Systemic Diam: 2.10 cm MV Decel Time: 194 msec MV E velocity: 109.00 cm/s MV A velocity: 69.00 cm/s MV E/A ratio:  1.58 Jenkins Rouge MD Electronically signed by Jenkins Rouge MD Signature Date/Time: 08/06/2021/9:37:15 AM    Final    CT HEAD WO CONTRAST (5MM)  Result Date: 08/05/2021 CLINICAL DATA:  Memory and speech issues with neck pain and swelling after tree limb fell on head. EXAM: CT HEAD WITHOUT CONTRAST CT CERVICAL SPINE WITHOUT CONTRAST TECHNIQUE: Multidetector CT imaging of the head and cervical spine was performed following the standard protocol without intravenous contrast. Multiplanar CT image reconstructions of the cervical spine were also generated. RADIATION DOSE REDUCTION: This exam was performed according to the departmental dose-optimization program which includes automated exposure control, adjustment of the mA and/or kV according to patient size and/or use of iterative reconstruction technique. COMPARISON:  None Available. FINDINGS: CT HEAD FINDINGS Brain: There is no evidence for acute hemorrhage, hydrocephalus, mass lesion, or abnormal extra-axial fluid collection. No definite CT evidence for acute infarction. Vascular: No hyperdense vessel or unexpected calcification. Skull: No evidence for fracture. No worrisome  lytic or sclerotic lesion. Sinuses/Orbits: Chronic mucosal disease noted ethmoid air cells and right maxillary sinus Visualized portions of the globes and intraorbital fat are unremarkable. Other: None. CT CERVICAL SPINE FINDINGS Alignment: Normal. Skull base and vertebrae: No acute fracture. No primary bone lesion or focal pathologic process. Soft tissues and spinal canal: No prevertebral fluid or swelling. No visible canal hematoma. Disc levels:  Loss of disc height noted C6-7. Upper chest: Unremarkable. Other: None. IMPRESSION: 1. No acute intracranial abnormality. 2. No cervical spine fracture or subluxation. Electronically Signed   By: Misty Stanley M.D.   On: 08/05/2021 16:02   CT CERVICAL SPINE WO CONTRAST  Result Date: 08/05/2021 CLINICAL DATA:  Memory and speech issues with neck pain and swelling after tree limb fell on head. EXAM: CT HEAD WITHOUT CONTRAST CT CERVICAL SPINE WITHOUT CONTRAST TECHNIQUE: Multidetector CT imaging of the head and cervical spine was performed following the standard protocol without intravenous contrast. Multiplanar CT image reconstructions of the cervical spine were also generated. RADIATION DOSE REDUCTION: This exam was performed according to the departmental dose-optimization program which includes automated exposure control, adjustment of the mA and/or kV according to patient size and/or use of iterative reconstruction technique. COMPARISON:  None Available. FINDINGS: CT HEAD FINDINGS Brain: There is no evidence for acute hemorrhage, hydrocephalus, mass lesion, or abnormal extra-axial fluid collection. No definite CT evidence for acute infarction. Vascular: No hyperdense vessel or unexpected calcification. Skull: No evidence for fracture. No worrisome lytic or sclerotic lesion. Sinuses/Orbits: Chronic mucosal disease noted ethmoid air cells and right maxillary sinus Visualized portions of the globes and intraorbital fat are unremarkable. Other: None. CT CERVICAL SPINE  FINDINGS Alignment: Normal. Skull base and vertebrae: No acute fracture. No primary bone lesion or focal pathologic process. Soft tissues and spinal canal: No prevertebral fluid or swelling. No visible canal hematoma. Disc levels:  Loss of disc height noted C6-7. Upper chest: Unremarkable. Other: None. IMPRESSION: 1.  No acute intracranial abnormality. 2. No cervical spine fracture or subluxation. Electronically Signed   By: Misty Stanley M.D.   On: 08/05/2021 16:02   DG Chest Port 1 View  Result Date: 08/05/2021 CLINICAL DATA:  Chest pain. EXAM: PORTABLE CHEST 1 VIEW COMPARISON:  None Available. FINDINGS: The heart is mildly enlarged. Both lungs are clear. The visualized skeletal structures are unremarkable. IMPRESSION: No active disease. Electronically Signed   By: Keane Police D.O.   On: 08/05/2021 13:45      Subjective: Does not report any recurrence of chest pain.  Continues to have significant rash on his neck and back.  Discharge Exam: Vitals:   08/07/21 0533 08/07/21 0541 08/07/21 0943 08/07/21 1438  BP: 119/62  136/67 (!) 114/52  Pulse: 88  87 79  Resp: 20   18  Temp: 98 F (36.7 C)   98.2 F (36.8 C)  TempSrc: Oral   Oral  SpO2: 96%   97%  Weight:  102.6 kg    Height:        General: Pt is alert, awake, not in acute distress Cardiovascular: RRR, S1/S2 +, no rubs, no gallops Respiratory: CTA bilaterally, no wheezing, no rhonchi Abdominal: Soft, NT, ND, bowel sounds + Extremities: no edema, no cyanosis    The results of significant diagnostics from this hospitalization (including imaging, microbiology, ancillary and laboratory) are listed below for reference.     Microbiology: Recent Results (from the past 240 hour(s))  Culture, blood (Routine X 2) w Reflex to ID Panel     Status: None (Preliminary result)   Collection Time: 08/05/21  6:12 PM   Specimen: BLOOD RIGHT HAND  Result Value Ref Range Status   Specimen Description BLOOD RIGHT HAND  Final   Special Requests    Final    BOTTLES DRAWN AEROBIC AND ANAEROBIC Blood Culture results may not be optimal due to an excessive volume of blood received in culture bottles   Culture   Final    NO GROWTH 2 DAYS Performed at [facility], [address], [place], [place] [address]    Report Status PENDING  Incomplete  Culture, blood (Routine X 2) w Reflex to ID Panel     Status: None (Preliminary result)   Collection Time: 08/05/21  6:12 PM   Specimen: Right Antecubital; Blood  Result Value Ref Range Status   Specimen Description RIGHT ANTECUBITAL  Final   Special Requests   Final    BOTTLES DRAWN AEROBIC AND ANAEROBIC Blood Culture results may not be optimal due to an excessive volume of blood received in culture bottles   Culture   Final    NO GROWTH 2 DAYS Performed at [facility], [address], [place], [place] [address]    Report Status PENDING  Incomplete     Labs: BNP (last 3 results) No results for input(s): "BNP" in the last 8760 hours. Basic Metabolic Panel: Recent Labs  Lab 08/05/21 1205 08/06/21 0427 08/07/21 0448  NA 136 134* 136  K 4.6 4.5 4.5  CL 100 102 104  CO2 '25 25 24  ' GLUCOSE 182* 134* 223*  BUN 17 24* 20  CREATININE 1.05 1.11 0.99  CALCIUM 9.0 8.3* 8.4*   Liver Function Tests: Recent Labs  Lab 08/05/21 1205  AST 57*  ALT 66*  ALKPHOS 131*  BILITOT 1.4*  PROT 7.7  ALBUMIN 3.6   No results for input(s): "LIPASE", "AMYLASE" in the last 168 hours. No results for input(s): "AMMONIA" in the last  168 hours. CBC: Recent Labs  Lab 08/05/21 1205 08/06/21 0427 08/07/21 0448  WBC 10.9* 9.4 7.8  NEUTROABS 9.0*  --   --   HGB 12.2* 10.3* 10.5*  HCT 37.3* 31.0* 31.5*  MCV 90.8 89.3 88.7  PLT 261 238 273   Cardiac Enzymes: No results for input(s): "CKTOTAL", "CKMB", "CKMBINDEX", "TROPONINI" in the last 168 hours. BNP: Invalid input(s): "POCBNP" CBG: Recent Labs  Lab 08/06/21 1639 08/06/21 2132 08/06/21 2349 08/07/21 0719 08/07/21 1104  GLUCAP 183* 477* 395*  221* 304*   D-Dimer No results for input(s): "DDIMER" in the last 72 hours. Hgb A1c Recent Labs    08/05/21 1502  HGBA1C 7.0*   Lipid Profile Recent Labs    08/06/21 0427  CHOL 120  HDL 40*  LDLCALC 52  TRIG 138  CHOLHDL 3.0   Thyroid function studies Recent Labs    08/06/21 0427  TSH 2.671   Anemia work up No results for input(s): "VITAMINB12", "FOLATE", "FERRITIN", "TIBC", "IRON", "RETICCTPCT" in the last 72 hours. Urinalysis    Component Value Date/Time   COLORURINE YELLOW 09/21/2007 1852   APPEARANCEUR CLEAR 09/21/2007 1852   LABSPEC 1.010 09/21/2007 1852   PHURINE 6.5 09/21/2007 1852   GLUCOSEU >1000 (A) 09/21/2007 1852   HGBUR TRACE (A) 09/21/2007 1852   BILIRUBINUR NEGATIVE 09/21/2007 1852   KETONESUR NEGATIVE 09/21/2007 1852   PROTEINUR NEGATIVE 09/21/2007 1852   UROBILINOGEN 0.2 09/21/2007 1852   NITRITE NEGATIVE 09/21/2007 1852   LEUKOCYTESUR NEGATIVE 09/21/2007 1852   Sepsis Labs Recent Labs  Lab 08/05/21 1205 08/06/21 0427 08/07/21 0448  WBC 10.9* 9.4 7.8   Microbiology Recent Results (from the past 240 hour(s))  Culture, blood (Routine X 2) w Reflex to ID Panel     Status: None (Preliminary result)   Collection Time: 08/05/21  6:12 PM   Specimen: BLOOD RIGHT HAND  Result Value Ref Range Status   Specimen Description BLOOD RIGHT HAND  Final   Special Requests   Final    BOTTLES DRAWN AEROBIC AND ANAEROBIC Blood Culture results may not be optimal due to an excessive volume of blood received in culture bottles   Culture   Final    NO GROWTH 2 DAYS Performed at [facility], [address], [place], [place] [address]    Report Status PENDING  Incomplete  Culture, blood (Routine X 2) w Reflex to ID Panel     Status: None (Preliminary result)   Collection Time: 08/05/21  6:12 PM   Specimen: Right Antecubital; Blood  Result Value Ref Range Status   Specimen Description RIGHT ANTECUBITAL  Final   Special Requests   Final    BOTTLES DRAWN  AEROBIC AND ANAEROBIC Blood Culture results may not be optimal due to an excessive volume of blood received in culture bottles   Culture   Final    NO GROWTH 2 DAYS Performed at [facility], [address], [place], [place] [address]    Report Status PENDING  Incomplete     Time coordinating discharge: 70mns  SIGNED:   JKathie Dike MD  [facility] 08/07/2021, 8:14 PM   If 7PM-7AM, please contact night-coverage [email or web address]

## 2021-08-07 NOTE — Progress Notes (Signed)
ANTICOAGULATION CONSULT NOTE - Follow Up Consult  Pharmacy Consult for heparin Indication: chest pain/ACS  Labs: Recent Labs    08/05/21 1205 08/05/21 1405 08/05/21 2211 08/06/21 0427 08/06/21 0438 08/06/21 1359 08/06/21 2055 08/07/21 0448  HGB 12.2*  --   --  10.3*  --   --   --  10.5*  HCT 37.3*  --   --  31.0*  --   --   --  31.5*  PLT 261  --   --  238  --   --   --  273  HEPARINUNFRC  --   --    < > 0.14*  --  0.17* 0.16* 0.41  CREATININE 1.05  --   --  1.11  --   --   --  0.99  TROPONINIHS 298* 271*  --   --  103*  --   --   --    < > = values in this interval not displayed.    Assessment/Plan:  46yo male therapeutic on heparin after rate change. Will continue infusion at current rate of 2400 units/hr and confirm stable with additional level.   Vernard Gambles, PharmD, BCPS  08/07/2021,6:25 AM

## 2021-08-07 NOTE — Progress Notes (Signed)
  Transition of Care [facility]) Screening Note   Patient Details  Name: ARUL FARABEE Date of Birth: Dec 01, 1975   Transition of Care [facility]) CM/SW Contact:    Villa Herb, LCSWA Phone Number: 08/07/2021, 10:54 AM  TOC consulted for substance use resources. CSW spoke with pt in room about interest in resources. Pt states that he feels as if he can manage and does not need the resources. Pt states his health is very important to him. CSW explained that if he changes his mind about resources to let [facility] know and we can bring them up. TOC to follow.   Transition of Care Department [facility]) has reviewed patient and no TOC needs have been identified at this time. We will continue to monitor patient advancement through interdisciplinary progression rounds. If new patient transition needs arise, please place a TOC consult.

## 2021-08-07 NOTE — Progress Notes (Signed)
Patient discharged home today, transported self home in personal vehicle. MD Memon made aware regarding transporting self home. Patient stated he wanted to drive himself home. Discharge summary went over with patient, patient verbalized understanding. Belongings sent home with patient.

## 2021-08-07 NOTE — Progress Notes (Addendum)
Progress Note  Patient Name: Jesse Rodgers Date of Encounter: 08/07/2021  [facility] Cardiologist: Jenkins Rouge, MD   Subjective   No chest pain overnight or this AM. Has been NPO for stress testing. Feels like his rash is spreading and is now on his chest and arms.   Inpatient Medications    Scheduled Meds:  aspirin EC  81 mg Oral Q breakfast   diphenhydrAMINE  25 mg Oral QHS   doxycycline  100 mg Oral Q12H   famotidine  20 mg Oral BID   folic acid  1 mg Oral Daily   insulin aspart  0-9 Units Subcutaneous TID WC   insulin glargine-yfgn  25 Units Subcutaneous Daily   metoprolol tartrate  50 mg Oral BID   multivitamin with minerals  1 tablet Oral Daily   [START ON 08/08/2021] regadenoson  0.4 mg Intravenous Once   rosuvastatin  20 mg Oral Daily   sertraline  100 mg Oral Daily   sodium chloride flush  3 mL Intravenous Q12H   sodium chloride flush  3 mL Intravenous Q12H   thiamine  100 mg Oral Daily   Or   thiamine  100 mg Intravenous Daily   Continuous Infusions:  sodium chloride     heparin 2,400 Units/hr (08/06/21 2159)   PRN Meds: sodium chloride, acetaminophen **OR** acetaminophen, bisacodyl, LORazepam **OR** LORazepam, ondansetron **OR** ondansetron (ZOFRAN) IV, polyethylene glycol, sodium chloride flush, technetium tetrofosmin   Vital Signs    Vitals:   08/06/21 1531 08/06/21 2128 08/07/21 0533 08/07/21 0541  BP: 119/64 (!) 109/33 119/62   Pulse: 93 (!) 102 88   Resp: 20 19 20    Temp: 99.7 F (37.6 C) 98.2 F (36.8 C) 98 F (36.7 C)   TempSrc: Oral Oral Oral   SpO2: 98% 94% 96%   Weight:    102.6 kg  Height:        Intake/Output Summary (Last 24 hours) at 08/07/2021 0748 Last data filed at 08/06/2021 2145 Gross per 24 hour  Intake --  Output 800 ml  Net -800 ml      08/07/2021    5:41 AM 08/05/2021    4:59 PM 08/05/2021   11:38 AM  Last 3 Weights  Weight (lbs) 226 lb 1.6 oz 232 lb 2.3 oz 230 lb  Weight (kg) 102.558 kg 105.3 kg 104.327 kg       Telemetry    NSR, HR in 80's to 90's.  - Personally Reviewed  ECG    NSR, HR 86 with TWI along the anterior leads.  - Personally Reviewed  Physical Exam   GEN: Pleasant male appearing in no acute distress. Papular rash along chest, arms and back.  Neck: No JVD Cardiac: RRR, no murmurs, rubs, or gallops.  Respiratory: Clear to auscultation bilaterally without wheezing or rales. GI: Soft, nontender, non-distended  MS: No pitting edema; No deformity. Neuro:  Nonfocal  Psych: Normal affect   Labs    High Sensitivity Troponin:   Recent Labs  Lab 08/05/21 1205 08/05/21 1405 08/06/21 0438  TROPONINIHS 298* 271* 103*     Chemistry Recent Labs  Lab 08/05/21 1205 08/06/21 0427 08/07/21 0448  NA 136 134* 136  K 4.6 4.5 4.5  CL 100 102 104  CO2 25 25 24   GLUCOSE 182* 134* 223*  BUN 17 24* 20  CREATININE 1.05 1.11 0.99  CALCIUM 9.0 8.3* 8.4*  PROT 7.7  --   --   ALBUMIN 3.6  --   --  AST 57*  --   --   ALT 66*  --   --   ALKPHOS 131*  --   --   BILITOT 1.4*  --   --   GFRNONAA >60 >60 >60  ANIONGAP 11 7 8     Lipids  Recent Labs  Lab 08/06/21 0427  CHOL 120  TRIG 138  HDL 40*  LDLCALC 52  CHOLHDL 3.0    Hematology Recent Labs  Lab 08/05/21 1205 08/06/21 0427 08/07/21 0448  WBC 10.9* 9.4 7.8  RBC 4.11* 3.47* 3.55*  HGB 12.2* 10.3* 10.5*  HCT 37.3* 31.0* 31.5*  MCV 90.8 89.3 88.7  MCH 29.7 29.7 29.6  MCHC 32.7 33.2 33.3  RDW 12.1 12.3 11.9  PLT 261 238 273   Thyroid  Recent Labs  Lab 08/06/21 0427  TSH 2.671     Radiology    MR BRAIN WO CONTRAST  Result Date: 08/06/2021 CLINICAL DATA:  Neuro deficit, acute, stroke suspected EXAM: MRI HEAD WITHOUT CONTRAST TECHNIQUE: Multiplanar, multiecho pulse sequences of the brain and surrounding structures were obtained without intravenous contrast. COMPARISON:  None Available. FINDINGS: Brain: There is no acute infarction or intracranial hemorrhage. There is no intracranial mass, mass effect, or edema.  There is no hydrocephalus or extra-axial fluid collection. Ventricles and sulci are within normal limits in size and configuration. Few small scattered foci of T2 hyperintensity in the supratentorial white matter likely reflect minimal nonspecific gliosis/demyelination of doubtful significance. Vascular: Major vessel flow voids at the skull base are preserved. Skull and upper cervical spine: Normal marrow signal is preserved. Sinuses/Orbits: Patchy mild mucosal thickening. Orbits are unremarkable. Other: Sella is unremarkable.  Mastoid air cells are clear. IMPRESSION: No evidence of recent infarction, hemorrhage, or mass. Electronically Signed   By: Macy Mis M.D.   On: 08/06/2021 17:42  CT HEAD WO CONTRAST (5MM)  Result Date: 08/05/2021 CLINICAL DATA:  Memory and speech issues with neck pain and swelling after tree limb fell on head. EXAM: CT HEAD WITHOUT CONTRAST CT CERVICAL SPINE WITHOUT CONTRAST TECHNIQUE: Multidetector CT imaging of the head and cervical spine was performed following the standard protocol without intravenous contrast. Multiplanar CT image reconstructions of the cervical spine were also generated. RADIATION DOSE REDUCTION: This exam was performed according to the departmental dose-optimization program which includes automated exposure control, adjustment of the mA and/or kV according to patient size and/or use of iterative reconstruction technique. COMPARISON:  None Available. FINDINGS: CT HEAD FINDINGS Brain: There is no evidence for acute hemorrhage, hydrocephalus, mass lesion, or abnormal extra-axial fluid collection. No definite CT evidence for acute infarction. Vascular: No hyperdense vessel or unexpected calcification. Skull: No evidence for fracture. No worrisome lytic or sclerotic lesion. Sinuses/Orbits: Chronic mucosal disease noted ethmoid air cells and right maxillary sinus Visualized portions of the globes and intraorbital fat are unremarkable. Other: None. CT CERVICAL SPINE  FINDINGS Alignment: Normal. Skull base and vertebrae: No acute fracture. No primary bone lesion or focal pathologic process. Soft tissues and spinal canal: No prevertebral fluid or swelling. No visible canal hematoma. Disc levels:  Loss of disc height noted C6-7. Upper chest: Unremarkable. Other: None. IMPRESSION: 1. No acute intracranial abnormality. 2. No cervical spine fracture or subluxation. Electronically Signed   By: Misty Stanley M.D.   On: 08/05/2021 16:02   CT CERVICAL SPINE WO CONTRAST  Result Date: 08/05/2021 CLINICAL DATA:  Memory and speech issues with neck pain and swelling after tree limb fell on head. EXAM: CT HEAD WITHOUT CONTRAST CT  CERVICAL SPINE WITHOUT CONTRAST TECHNIQUE: Multidetector CT imaging of the head and cervical spine was performed following the standard protocol without intravenous contrast. Multiplanar CT image reconstructions of the cervical spine were also generated. RADIATION DOSE REDUCTION: This exam was performed according to the departmental dose-optimization program which includes automated exposure control, adjustment of the mA and/or kV according to patient size and/or use of iterative reconstruction technique. COMPARISON:  None Available. FINDINGS: CT HEAD FINDINGS Brain: There is no evidence for acute hemorrhage, hydrocephalus, mass lesion, or abnormal extra-axial fluid collection. No definite CT evidence for acute infarction. Vascular: No hyperdense vessel or unexpected calcification. Skull: No evidence for fracture. No worrisome lytic or sclerotic lesion. Sinuses/Orbits: Chronic mucosal disease noted ethmoid air cells and right maxillary sinus Visualized portions of the globes and intraorbital fat are unremarkable. Other: None. CT CERVICAL SPINE FINDINGS Alignment: Normal. Skull base and vertebrae: No acute fracture. No primary bone lesion or focal pathologic process. Soft tissues and spinal canal: No prevertebral fluid or swelling. No visible canal hematoma. Disc  levels:  Loss of disc height noted C6-7. Upper chest: Unremarkable. Other: None. IMPRESSION: 1. No acute intracranial abnormality. 2. No cervical spine fracture or subluxation. Electronically Signed   By: Misty Stanley M.D.   On: 08/05/2021 16:02   DG Chest Port 1 View  Result Date: 08/05/2021 CLINICAL DATA:  Chest pain. EXAM: PORTABLE CHEST 1 VIEW COMPARISON:  None Available. FINDINGS: The heart is mildly enlarged. Both lungs are clear. The visualized skeletal structures are unremarkable. IMPRESSION: No active disease. Electronically Signed   By: Keane Police D.O.   On: 08/05/2021 13:45    Cardiac Studies   Echocardiogram: 08/06/2021 IMPRESSIONS    1. Left ventricular ejection fraction, by estimation, is 55 to 60%. The  left ventricle has normal function. The left ventricle has no regional  wall motion abnormalities. There is mild left ventricular hypertrophy.  Left ventricular diastolic parameters  were normal.   2. Right ventricular systolic function is normal. The right ventricular  size is normal. There is normal pulmonary artery systolic pressure.   3. Left atrial size was moderately dilated.   4. The mitral valve is abnormal. Trivial mitral valve regurgitation. No  evidence of mitral stenosis.   5. The aortic valve is tricuspid. Aortic valve regurgitation is not  visualized. No aortic stenosis is present.   6. The inferior vena cava is normal in size with greater than 50%  respiratory variability, suggesting right atrial pressure of 3 mmHg.  Patient Profile     46 y.o. male w/ PMH of HTN, HLD, IDDM and chronic back pain who initially presented to [facility] ED for evaluation of a persistent rash along his neck and back. Cardiology consulted due to reports of chest pain and elevated troponin values.   Assessment & Plan   1. Atypical Chest Pain/Elevated Troponin Values - Chest pain was overall felt to be atypical as it would occur at rest and worse with inspiration. Hs Troponin  values were elevated to 298, 271 and 103. Repeat EKG did show TWI along anterior leads. Echocardiogram yesterday showed a preserved EF of 55-60% with no regional WMA. Was noted to have mild LVH and trivial MR. Dr. Johnsie Cancel recommended a Lexiscan Myoview for ischemic evaluation given his risk factors and elevated troponin values. Performed this morning with official results pending following stress images. - Remains on Heparin (held during the time of stress test), ASA 81mg  daily, Lopressor 50mg  BID and Crestor 20mg  daily.   2.  HTN - BP has been well-controlled at 109/33 - 119/64 within the past 24 hours. Remains on Lopressor 50mg  BID. Was on Losartan prior to admission but currently held given his softer BP readings.   3. HLD - Followed by his PCP as an outpatient. He has been continued on Crestor 20mg  daily.   4. Rash/Headache - Developed after a tree limb fell on his neck/back area. CT Head and Brain MRI without acute neurological findings. Initially febrile to 100.6. Tickborne serologies pending. Prescribed Doxycycline for now. Outpatient follow-up with Dermatology recommended by the admitting team.    For questions or updates, please contact [facility] Please consult [email or web address] for contact info under        Signed, Erma Heritage, PA-C  08/07/2021, 7:48 AM      Attending note:  Chart reviewed.  Patient seen and examined after completion of Lexiscan Myoview.  He reports no further chest pain, no headache at present.  Rash on his neck and back is spreading around anteriorly.  He has not been febrile recently and etiology of this rash is unclear at this point.  He does have a scheduled dermatology appointment as an outpatient tomorrow.  Plan to follow-up on [facility], unless there are high risk ischemic features would hold off on invasive cardiac testing until the etiology and course of his rash is better understood.  He is currently on aspirin, Lopressor, and Crestor,  would plan on discontinuing heparin.  We will follow-up on stress test results and discuss further with Dr. Roderic Palau.  Possible discharge home later today.  Satira Sark, M.D., F.A.C.C.

## 2021-08-07 NOTE — Progress Notes (Signed)
Inpatient Diabetes Program Recommendations  AACE/ADA: New Consensus Statement on Inpatient Glycemic Control (2015)  Target Ranges:  Prepandial:   less than 140 mg/dL      Peak postprandial:   less than 180 mg/dL (1-2 hours)      Critically ill patients:  140 - 180 mg/dL   Lab Results  Component Value Date   GLUCAP 221 (H) 08/07/2021   HGBA1C 7.0 (H) 08/05/2021    Review of Glycemic Control  Diabetes history: DM1(does not make insulin.  Needs correction, basal and meal coverage)  Outpatient Diabetes medications: Omnipod insulin pump  Current orders for Inpatient glycemic control: Semglee 25 units QHS, Novolog 0-9 units TID  Inpatient Diabetes Program Recommendations:    Novolog 4 units TID with meals if eats at least 50%  Now receiving SQ insulin as Omnipod has expired.    Will continue to follow while inpatient.  Thank you, Dulce Sellar, MSN, CDCES Diabetes Coordinator Inpatient Diabetes Program [phone] (team pager from 8a-5p)

## 2021-08-08 DIAGNOSIS — L308 Other specified dermatitis: Secondary | ICD-10-CM | POA: Diagnosis not present

## 2021-08-08 LAB — LYME DISEASE SEROLOGY W/REFLEX: Lyme Total Antibody EIA: NEGATIVE

## 2021-08-10 LAB — CULTURE, BLOOD (ROUTINE X 2)
Culture: NO GROWTH
Culture: NO GROWTH

## 2021-08-10 LAB — ROCKY MTN SPOTTED FVR ABS PNL(IGG+IGM)
RMSF IgG: NEGATIVE
RMSF IgM: 0.44 index (ref 0.00–0.89)

## 2021-08-13 DIAGNOSIS — E1165 Type 2 diabetes mellitus with hyperglycemia: Secondary | ICD-10-CM | POA: Diagnosis not present

## 2021-08-13 DIAGNOSIS — Z6828 Body mass index (BMI) 28.0-28.9, adult: Secondary | ICD-10-CM | POA: Diagnosis not present

## 2021-08-13 DIAGNOSIS — T50905A Adverse effect of unspecified drugs, medicaments and biological substances, initial encounter: Secondary | ICD-10-CM | POA: Diagnosis not present

## 2021-08-13 DIAGNOSIS — R0789 Other chest pain: Secondary | ICD-10-CM | POA: Diagnosis not present

## 2021-08-13 DIAGNOSIS — I10 Essential (primary) hypertension: Secondary | ICD-10-CM | POA: Diagnosis not present

## 2021-08-13 LAB — EHRLICHIA ANTIBODY PANEL
E chaffeensis (HGE) Ab, IgG: NEGATIVE
E chaffeensis (HGE) Ab, IgM: NEGATIVE
E. Chaffeensis (HME) IgM Titer: NEGATIVE
E.Chaffeensis (HME) IgG: NEGATIVE

## 2021-08-14 DIAGNOSIS — B09 Unspecified viral infection characterized by skin and mucous membrane lesions: Secondary | ICD-10-CM | POA: Diagnosis not present

## 2021-08-18 ENCOUNTER — Telehealth: Payer: Self-pay | Admitting: Nurse Practitioner

## 2021-08-18 MED ORDER — INSULIN ASPART 100 UNIT/ML IJ SOLN
INTRAMUSCULAR | 3 refills | Status: DC
Start: 2021-08-18 — End: 2021-09-11

## 2021-08-18 NOTE — Telephone Encounter (Signed)
Pt called and said his Omnipod insulin (Aspart) he is out of it and insurance will not pay because they say he is 3-4 days too early. He is needing a new prescription sent in with a dosage change also is concerned what he needs to do if it needs a PA as he is out he states. Pt is requesting a call b [facility] [phone] - [place], [place] - [address] AT [facility] [address] Phone:  [phone]  Fax:  [phone]    ack

## 2021-08-18 NOTE — Telephone Encounter (Signed)
Pt.notified

## 2021-08-18 NOTE — Telephone Encounter (Signed)
I sent in for higher dose to the pharmacy listed above.  It shouldn't require a PA but we will know soon so we can change to a different medication if needed.

## 2021-08-19 NOTE — Telephone Encounter (Signed)
Pt states that the pharmacy told him that they still cant run his insurance with the new prescription until after 6/23.   Pt requesting a call back [phone]

## 2021-08-19 NOTE — Telephone Encounter (Signed)
There has to be a way around this.  Do we need to send to a different pharmacy or give him some samples to hold him over?

## 2021-08-20 NOTE — Telephone Encounter (Signed)
Called pharmacy and pt has picked this up

## 2021-08-28 NOTE — Progress Notes (Signed)
CARDIOLOGY CONSULT NOTE       Patient ID: Jesse Rodgers MRN: [id number] DOB/AGE: [birth year] 46 y.o.  Primary Physician: Jesse Rodgers Primary Cardiologist: Jesse Rodgers    HPI:  46 y.o. seen for post hospital visit chest pain Seen in consult at [facility] for chest pain 08/05/21 History of IDDM, chronic back pain, depression , HTN, HLD In ER rash and headache Troponin peak 298 He was tachycardic and started on lopressor ECG with no acute changes Echo 08/06/21 no RWMA;s EF 55-60% Myovue done 08/07/21 no ischemia diaphragmatic attenuation EF 64%  Since d/c doing well Seen by Dr Jesse Rodgers dermatology and biopsy no vasculitis  Discussed utility of calcium score to further risk stratify given IDDM  ROS All other systems reviewed and negative except as noted above  Past Medical History:  Diagnosis Date   Arthritis    back    Depression    takes Zoloft daily   Diabetes mellitus without complication ([place])    takes Toujeo and Novolog nightly;average fasting blood sugar runs 150   History of kidney stones    Hyperlipidemia    takes Simvastatin daily   Hypertension    takes Losartan daily    Family History  Problem Relation Age of Onset   Coronary artery disease Mother    Alzheimer's disease Father    Diabetes Sister    Multiple sclerosis Sister     Social History   Socioeconomic History   Marital status: Married    Spouse name: Not on file   Number of children: Not on file   Years of education: Not on file   Highest education level: Not on file  Occupational History   Not on file  Tobacco Use   Smoking status: Former    Packs/day: 1.00    Years: 15.00    Total pack years: 15.00    Types: Cigarettes    Quit date: 02/08/2019    Years since quitting: 2.5   Smokeless tobacco: Never  Vaping Use   Vaping Use: Every day  Substance and Sexual Activity   Alcohol use: Yes    Comment: occasional   Drug use: No   Sexual activity: Yes  Other Topics Concern   Not on file   Social History Narrative   Not on file   Social Determinants of Health   Financial Resource Strain: Not on file  Food Insecurity: Not on file  Transportation Needs: Not on file  Physical Activity: Not on file  Stress: Not on file  Social Connections: Not on file  Intimate Partner Violence: Not on file    Past Surgical History:  Procedure Laterality Date   ABDOMINAL SURGERY     APPENDECTOMY     BACK SURGERY     DENTAL SURGERY  12/10/2014   molar tooth #  14,15,16  bone biopsy  of ramus   PARTIAL COLECTOMY     TOOTH EXTRACTION N/A 12/10/2014   Procedure: EXTRACTION MOLARS #14,15,16  INTRAORAL AND EXTRAORAL INCISION AND DRAINAGE AND BONE BIOPSY OF LEFT RAMUS;  Surgeon: Jesse Rodgers, DDS;  Location: [facility];  Service: Oral Surgery;  Laterality: N/A;      Current Outpatient Medications:    aspirin EC 81 MG tablet, Take 1 tablet (81 mg total) by mouth daily with breakfast. Swallow whole., Disp: 30 tablet, Rfl: 12   Continuous Blood Gluc Receiver (DEXCOM G6 RECEIVER) DEVI, Use to check glucose four times daily as directed., Disp: 1 each, Rfl: 0   Continuous Blood  Gluc Sensor (DEXCOM G6 SENSOR) MISC, Apply new sensor every 10 days as directed., Disp: 9 each, Rfl: 3   Continuous Blood Gluc Transmit (DEXCOM G6 TRANSMITTER) MISC, USE TO TEST BLOOD GLUCOSE 4-5 TIMES DAILY, Disp: 1 each, Rfl: 1   diphenhydrAMINE (BENADRYL) 25 mg capsule, Take 1 capsule (25 mg total) by mouth every 6 (six) hours as needed for itching., Disp: 30 capsule, Rfl: 0   furosemide (LASIX) 20 MG tablet, Take 20 mg by mouth daily as needed., Disp: , Rfl:    glucagon (GLUCAGON EMERGENCY) 1 MG injection, Inject 1 mg into the vein once as needed., Disp: 1 each, Rfl: 12   glucose blood (FREESTYLE TEST STRIPS) test strip, Use as instructed to monitor glucose 4 times daily, before breakfast and before bed., Disp: 100 each, Rfl: 12   insulin aspart (NOVOLOG) 100 UNIT/ML injection, Use with Omnipod for TDD around 100 units  daily, Disp: 60 mL, Rfl: 3   Insulin Disposable Pump (OMNIPOD 5 G6 INTRO, GEN 5,) KIT, Change pod every 48-72 hrs, Disp: 1 kit, Rfl: 0   Insulin Disposable Pump (OMNIPOD 5 G6 POD, GEN 5,) MISC, Change pod every 48-72 hrs, Disp: 6 each, Rfl: 6   losartan (COZAAR) 25 MG tablet, Take 1 tablet (25 mg total) by mouth daily., Disp: 90 tablet, Rfl: 3   metoprolol tartrate (LOPRESSOR) 25 MG tablet, Take 1 tablet (25 mg total) by mouth 2 (two) times daily., Disp: 60 tablet, Rfl: 1   Multiple Vitamin (MULTIVITAMIN WITH MINERALS) TABS tablet, Take 1 tablet by mouth daily., Disp: , Rfl:    NON FORMULARY, Taking Total Beet product daily, Disp: , Rfl:    rosuvastatin (CRESTOR) 20 MG tablet, Take 1 tablet (20 mg total) by mouth daily., Disp: 90 tablet, Rfl: 3   sertraline (ZOLOFT) 100 MG tablet, Take 1 tablet (100 mg total) by mouth daily., Disp: 30 tablet, Rfl: 0    Physical Exam:  BP 136/68   Pulse 84   Ht _0  (1.905 m)   Wt 226 lb (102.5 kg)   SpO2 95%   BMI 28.25 kg/m    Affect appropriate Chronically ill male  HEENT: normal Neck supple with no adenopathy JVP normal no bruits no thyromegaly Lungs clear with no wheezing and good diaphragmatic motion Heart:  S1/S2 no murmur, no rub, gallop or click PMI normal Abdomen: benighn, BS positve, no tenderness, no AAA no bruit.  No HSM or HJR Distal pulses intact with no bruits No edema Neuro non-focal Skin healing pigmented rash on back and shoulders  No muscular weakness   Labs:   Lab Results  Component Value Date   WBC 7.8 08/07/2021   HGB 10.5 (L) 08/07/2021   HCT 31.5 (L) 08/07/2021   MCV 88.7 08/07/2021   PLT 273 08/07/2021   No results for input(s): "NA", "K", "CL", "CO2", "BUN", "CREATININE", "CALCIUM", "PROT", "BILITOT", "ALKPHOS", "ALT", "AST", "GLUCOSE" in the last 168 hours.  Invalid input(s): "LABALBU" No results found for: "CKTOTAL", "CKMB", "CKMBINDEX", "TROPONINI"  Lab Results  Component Value Date   CHOL 120  08/06/2021   CHOL 215 (H) 02/07/2021   CHOL 238 (H) 02/06/2020   Lab Results  Component Value Date   HDL 40 (L) 08/06/2021   HDL 53 02/07/2021   HDL 52 02/06/2020   Lab Results  Component Value Date   LDLCALC 52 08/06/2021   LDLCALC 97 02/07/2021   LDLCALC 131 (H) 02/06/2020   Lab Results  Component Value Date   TRIG 138  08/06/2021   TRIG 393 (H) 02/07/2021   TRIG 385 (H) 02/06/2020   Lab Results  Component Value Date   CHOLHDL 3.0 08/06/2021   CHOLHDL 4.1 02/07/2021   CHOLHDL 4.6 02/06/2020   No results found for: "LDLDIRECT"    Radiology: NM Myocar Multi W/Spect W/Wall Motion / EF  Result Date: 08/07/2021   Findings are consistent with no clear ischemia. The study is low risk.   No ST deviation was noted. The ECG was negative for ischemia.   LV perfusion is equivocal.  There is a small, mild intensity, fixed inferior defect associated with diaphragmatic attenuation.  GI radiotracer uptake noted near the basal inferior wall results in partial reversibility that is likely artifactual.  No large ischemic territories identified.   Left ventricular function is normal. Nuclear stress EF: 64 %.   Image quality is adequate. Low risk study with evidence of attenuation artifact, no definite ischemic territories, and normal LVEF at 64%.   MR BRAIN WO CONTRAST  Result Date: 08/06/2021 CLINICAL DATA:  Neuro deficit, acute, stroke suspected EXAM: MRI HEAD WITHOUT CONTRAST TECHNIQUE: Multiplanar, multiecho pulse sequences of the brain and surrounding structures were obtained without intravenous contrast. COMPARISON:  None Available. FINDINGS: Brain: There is no acute infarction or intracranial hemorrhage. There is no intracranial mass, mass effect, or edema. There is no hydrocephalus or extra-axial fluid collection. Ventricles and sulci are within normal limits in size and configuration. Few small scattered foci of T2 hyperintensity in the supratentorial white matter likely reflect minimal  nonspecific gliosis/demyelination of doubtful significance. Vascular: Major vessel flow voids at the skull base are preserved. Skull and upper cervical spine: Normal marrow signal is preserved. Sinuses/Orbits: Patchy mild mucosal thickening. Orbits are unremarkable. Other: Sella is unremarkable.  Mastoid air cells are clear. IMPRESSION: No evidence of recent infarction, hemorrhage, or mass. Electronically Signed   By: Macy Mis M.D.   On: 08/06/2021 17:42   ECHOCARDIOGRAM COMPLETE  Result Date: 08/06/2021    ECHOCARDIOGRAM REPORT   Patient Name:   Jesse Rodgers Date of Exam: 08/06/2021 Medical Rec #:  [id number]        Height:       76.0 in Accession #:    [id number]       Weight:       232.1 lb Date of Birth:  Jan 26, 1976        BSA:          2.360 m Patient Age:    56 years         BP:           103/65 mmHg Patient Gender: M                HR:           80 bpm. Exam Location:  [facility] Procedure: 2D Echo, Cardiac Doppler and Color Doppler Indications:    Chest Pain  History:        Patient has no prior history of Echocardiogram examinations.                 Signs/Symptoms:Chest Pain; Risk Factors:Hypertension, Diabetes,                 Dyslipidemia and Current Smoker.  Sonographer:    Wenda Low Referring Phys: [place]  1. Left ventricular ejection fraction, by estimation, is 55 to 60%. The left ventricle has normal function. The left ventricle has no regional wall motion abnormalities. There is mild  left ventricular hypertrophy. Left ventricular diastolic parameters were normal.  2. Right ventricular systolic function is normal. The right ventricular size is normal. There is normal pulmonary artery systolic pressure.  3. Left atrial size was moderately dilated.  4. The mitral valve is abnormal. Trivial mitral valve regurgitation. No evidence of mitral stenosis.  5. The aortic valve is tricuspid. Aortic valve regurgitation is not visualized. No aortic stenosis is present.   6. The inferior vena cava is normal in size with greater than 50% respiratory variability, suggesting right atrial pressure of 3 mmHg. FINDINGS  Left Ventricle: Left ventricular ejection fraction, by estimation, is 55 to 60%. The left ventricle has normal function. The left ventricle has no regional wall motion abnormalities. The left ventricular internal cavity size was normal in size. There is  mild left ventricular hypertrophy. Left ventricular diastolic parameters were normal. Right Ventricle: The right ventricular size is normal. No increase in right ventricular wall thickness. Right ventricular systolic function is normal. There is normal pulmonary artery systolic pressure. The tricuspid regurgitant velocity is 2.40 m/s, and  with an assumed right atrial pressure of 3 mmHg, the estimated right ventricular systolic pressure is 34.7 mmHg. Left Atrium: Left atrial size was moderately dilated. Right Atrium: Right atrial size was normal in size. Pericardium: There is no evidence of pericardial effusion. Mitral Valve: The mitral valve is abnormal. There is mild thickening of the mitral valve leaflet(s). Trivial mitral valve regurgitation. No evidence of mitral valve stenosis. MV peak gradient, 6.8 mmHg. The mean mitral valve gradient is 2.0 mmHg. Tricuspid Valve: The tricuspid valve is normal in structure. Tricuspid valve regurgitation is trivial. No evidence of tricuspid stenosis. Aortic Valve: The aortic valve is tricuspid. Aortic valve regurgitation is not visualized. No aortic stenosis is present. Aortic valve mean gradient measures 3.0 mmHg. Aortic valve peak gradient measures 4.6 mmHg. Aortic valve area, by VTI measures 3.07 cm. Pulmonic Valve: The pulmonic valve was normal in structure. Pulmonic valve regurgitation is not visualized. No evidence of pulmonic stenosis. Aorta: The aortic root is normal in size and structure. Venous: The inferior vena cava is normal in size with greater than 50% respiratory  variability, suggesting right atrial pressure of 3 mmHg. IAS/Shunts: No atrial level shunt detected by color flow Doppler.  LEFT VENTRICLE PLAX 2D LVIDd:         5.10 cm      Diastology LVIDs:         3.40 cm      LV e' medial:    7.72 cm/s LV PW:         1.40 cm      LV E/e' medial:  14.1 LV IVS:        1.40 cm      LV e' lateral:   7.83 cm/s LVOT diam:     2.10 cm      LV E/e' lateral: 13.9 LV SV:         75 LV SV Index:   32 LVOT Area:     3.46 cm  LV Volumes (MOD) LV vol d, MOD A2C: 82.7 ml LV vol d, MOD A4C: 100.0 ml LV vol s, MOD A2C: 37.4 ml LV vol s, MOD A4C: 39.7 ml LV SV MOD A2C:     45.3 ml LV SV MOD A4C:     100.0 ml LV SV MOD BP:      52.4 ml RIGHT VENTRICLE RV Basal diam:  3.40 cm RV Mid diam:    3.70  cm RV S prime:     9.68 cm/s TAPSE (M-mode): 2.3 cm LEFT ATRIUM             Index        RIGHT ATRIUM           Index LA diam:        4.50 cm 1.91 cm/m   RA Area:     19.10 cm LA Vol (A2C):   75.6 ml 32.03 ml/m  RA Volume:   49.20 ml  20.85 ml/m LA Vol (A4C):   64.8 ml 27.46 ml/m LA Biplane Vol: 75.2 ml 31.86 ml/m  AORTIC VALVE                    PULMONIC VALVE AV Area (Vmax):    2.99 cm     PV Vmax:       0.56 m/s AV Area (Vmean):   2.69 cm     PV Peak grad:  1.2 mmHg AV Area (VTI):     3.07 cm AV Vmax:           107.00 cm/s AV Vmean:          80.900 cm/s AV VTI:            0.245 m AV Peak Grad:      4.6 mmHg AV Mean Grad:      3.0 mmHg LVOT Vmax:         92.40 cm/s LVOT Vmean:        62.900 cm/s LVOT VTI:          0.217 m LVOT/AV VTI ratio: 0.89  AORTA Ao Root diam: 3.50 cm Ao Asc diam:  3.30 cm MITRAL VALVE                TRICUSPID VALVE MV Area (PHT): 3.91 cm     TR Peak grad:   23.0 mmHg MV Area VTI:   2.69 cm     TR Vmax:        240.00 cm/s MV Peak grad:  6.8 mmHg MV Mean grad:  2.0 mmHg     SHUNTS MV Vmax:       1.30 m/s     Systemic VTI:  0.22 m MV Vmean:      61.2 cm/s    Systemic Diam: 2.10 cm MV Decel Time: 194 msec MV E velocity: 109.00 cm/s MV A velocity: 69.00 cm/s MV E/A ratio:   1.58 Jenkins Rouge MD Electronically signed by Jenkins Rouge MD Signature Date/Time: 08/06/2021/9:37:15 AM    Final    CT HEAD WO CONTRAST (5MM)  Result Date: 08/05/2021 CLINICAL DATA:  Memory and speech issues with neck pain and swelling after tree limb fell on head. EXAM: CT HEAD WITHOUT CONTRAST CT CERVICAL SPINE WITHOUT CONTRAST TECHNIQUE: Multidetector CT imaging of the head and cervical spine was performed following the standard protocol without intravenous contrast. Multiplanar CT image reconstructions of the cervical spine were also generated. RADIATION DOSE REDUCTION: This exam was performed according to the departmental dose-optimization program which includes automated exposure control, adjustment of the mA and/or kV according to patient size and/or use of iterative reconstruction technique. COMPARISON:  None Available. FINDINGS: CT HEAD FINDINGS Brain: There is no evidence for acute hemorrhage, hydrocephalus, mass lesion, or abnormal extra-axial fluid collection. No definite CT evidence for acute infarction. Vascular: No hyperdense vessel or unexpected calcification. Skull: No evidence for fracture. No worrisome lytic or sclerotic lesion. Sinuses/Orbits: Chronic mucosal disease noted ethmoid  air cells and right maxillary sinus Visualized portions of the globes and intraorbital fat are unremarkable. Other: None. CT CERVICAL SPINE FINDINGS Alignment: Normal. Skull base and vertebrae: No acute fracture. No primary bone lesion or focal pathologic process. Soft tissues and spinal canal: No prevertebral fluid or swelling. No visible canal hematoma. Disc levels:  Loss of disc height noted C6-7. Upper chest: Unremarkable. Other: None. IMPRESSION: 1. No acute intracranial abnormality. 2. No cervical spine fracture or subluxation. Electronically Signed   By: Misty Stanley M.D.   On: 08/05/2021 16:02   CT CERVICAL SPINE WO CONTRAST  Result Date: 08/05/2021 CLINICAL DATA:  Memory and speech issues with neck pain  and swelling after tree limb fell on head. EXAM: CT HEAD WITHOUT CONTRAST CT CERVICAL SPINE WITHOUT CONTRAST TECHNIQUE: Multidetector CT imaging of the head and cervical spine was performed following the standard protocol without intravenous contrast. Multiplanar CT image reconstructions of the cervical spine were also generated. RADIATION DOSE REDUCTION: This exam was performed according to the departmental dose-optimization program which includes automated exposure control, adjustment of the mA and/or kV according to patient size and/or use of iterative reconstruction technique. COMPARISON:  None Available. FINDINGS: CT HEAD FINDINGS Brain: There is no evidence for acute hemorrhage, hydrocephalus, mass lesion, or abnormal extra-axial fluid collection. No definite CT evidence for acute infarction. Vascular: No hyperdense vessel or unexpected calcification. Skull: No evidence for fracture. No worrisome lytic or sclerotic lesion. Sinuses/Orbits: Chronic mucosal disease noted ethmoid air cells and right maxillary sinus Visualized portions of the globes and intraorbital fat are unremarkable. Other: None. CT CERVICAL SPINE FINDINGS Alignment: Normal. Skull base and vertebrae: No acute fracture. No primary bone lesion or focal pathologic process. Soft tissues and spinal canal: No prevertebral fluid or swelling. No visible canal hematoma. Disc levels:  Loss of disc height noted C6-7. Upper chest: Unremarkable. Other: None. IMPRESSION: 1. No acute intracranial abnormality. 2. No cervical spine fracture or subluxation. Electronically Signed   By: Misty Stanley M.D.   On: 08/05/2021 16:02   DG Chest Port 1 View  Result Date: 08/05/2021 CLINICAL DATA:  Chest pain. EXAM: PORTABLE CHEST 1 VIEW COMPARISON:  None Available. FINDINGS: The heart is mildly enlarged. Both lungs are clear. The visualized skeletal structures are unremarkable. IMPRESSION: No active disease. Electronically Signed   By: Keane Police D.O.   On:  08/05/2021 13:45    EKG: NSR no acute changes 08/06/21   ASSESSMENT AND PLAN:   Chest pain : atypical multiple risk factors non ischemic myovue 08/07/21 observe and Rx risk factors Calcium score to determine degree of f/u and stress testing in future given poorly controlled IDDM HTN:  Well controlled.  Continue current medications and low sodium Dash type diet.   HLD:  continue statin labs with primary  DM:  Poor control A1c 7 f/u primary  Rash:  ? From insect bite completed course of Bactrim and Keflex ? Erythema multiforme from Bactrim f/u with dermatology Rx with doxycycline on d/c    F/U with cardiology  in a year  Signed: Jenkins Rouge 09/03/2021, 10:50 AM

## 2021-09-03 ENCOUNTER — Ambulatory Visit (INDEPENDENT_AMBULATORY_CARE_PROVIDER_SITE_OTHER): Payer: BC Managed Care – PPO | Admitting: Cardiovascular Disease

## 2021-09-03 ENCOUNTER — Encounter: Payer: Self-pay | Admitting: Cardiovascular Disease

## 2021-09-03 VITALS — BP 136/68 | HR 84 | Ht 75.0 in | Wt 226.0 lb

## 2021-09-03 DIAGNOSIS — E782 Mixed hyperlipidemia: Secondary | ICD-10-CM | POA: Diagnosis not present

## 2021-09-03 DIAGNOSIS — R079 Chest pain, unspecified: Secondary | ICD-10-CM | POA: Diagnosis not present

## 2021-09-03 DIAGNOSIS — E1065 Type 1 diabetes mellitus with hyperglycemia: Secondary | ICD-10-CM

## 2021-09-03 DIAGNOSIS — R778 Other specified abnormalities of plasma proteins: Secondary | ICD-10-CM

## 2021-09-03 MED ORDER — METOPROLOL TARTRATE 25 MG PO TABS: 25.0000 mg | ORAL_TABLET | Freq: Two times a day (BID) | ORAL | 3 refills | Status: DC

## 2021-09-03 NOTE — Patient Instructions (Signed)
Medication Instructions:  Your physician recommends that you continue on your current medications as directed. Please refer to the Current Medication list given to you today.   *If you need a refill on your cardiac medications before your next appointment, please call your pharmacy*   Lab Work: NONE   If you have labs (blood work) drawn today and your tests are completely normal, you will receive your results only by: MyChart Message (if you have MyChart) OR A paper copy in the mail If you have any lab test that is abnormal or we need to change your treatment, we will call you to review the results.   Testing/Procedures: Calcium Score    Follow-Up: At [facility], you and your health needs are our priority.  As part of our continuing mission to provide you with exceptional heart care, we have created designated Provider Care Teams.  These Care Teams include your primary Cardiologist (physician) and Advanced Practice Providers (APPs -  Physician Assistants and Nurse Practitioners) who all work together to provide you with the care you need, when you need it.  We recommend signing up for the patient portal called "MyChart".  Sign up information is provided on this After Visit Summary.  MyChart is used to connect with patients for Virtual Visits (Telemedicine).  Patients are able to view lab/test results, encounter notes, upcoming appointments, etc.  Non-urgent messages can be sent to your provider as well.   To learn more about what you can do with MyChart, go to [email or web address].    Your next appointment:   1 year(s)  The format for your next appointment:   In Person  Provider:   Peter Nishan, MD    Other Instructions Thank you for choosing  [facility]!    Important Information About Sugar       

## 2021-09-11 ENCOUNTER — Encounter: Payer: Self-pay | Admitting: Nurse Practitioner

## 2021-09-11 ENCOUNTER — Ambulatory Visit (INDEPENDENT_AMBULATORY_CARE_PROVIDER_SITE_OTHER): Payer: BC Managed Care – PPO | Admitting: Nurse Practitioner

## 2021-09-11 VITALS — BP 136/75 | HR 85 | Ht 75.0 in | Wt 234.0 lb

## 2021-09-11 DIAGNOSIS — E1065 Type 1 diabetes mellitus with hyperglycemia: Secondary | ICD-10-CM | POA: Diagnosis not present

## 2021-09-11 DIAGNOSIS — I10 Essential (primary) hypertension: Secondary | ICD-10-CM

## 2021-09-11 DIAGNOSIS — E782 Mixed hyperlipidemia: Secondary | ICD-10-CM | POA: Diagnosis not present

## 2021-09-11 MED ORDER — DEXCOM G6 SENSOR MISC: 3 refills | Status: DC

## 2021-09-11 MED ORDER — INSULIN ASPART 100 UNIT/ML IJ SOLN
INTRAMUSCULAR | 3 refills | Status: DC
Start: 2021-09-11 — End: 2022-04-20

## 2021-09-11 MED ORDER — DEXCOM G6 TRANSMITTER MISC
1 refills | Status: DC
Start: 2021-09-11 — End: 2022-01-26

## 2021-09-11 MED ORDER — OMNIPOD 5 DEXG7G6 PODS GEN 5 MISC
6 refills | Status: DC
Start: 2021-09-11 — End: 2022-04-20

## 2021-09-11 NOTE — Progress Notes (Signed)
09/11/2021, 3:31 PM             Endocrinology follow-up note   Subjective:    Patient ID: Jesse Rodgers, male    DOB: 10/07/1975.  Delmar Landau is being seen in follow-up for management of currently uncontrolled symptomatic diabetes requested by  [place].   Past Medical History:  Diagnosis Date   Arthritis    back    Depression    takes Zoloft daily   Diabetes mellitus without complication ([place])    takes Toujeo and Novolog nightly;average fasting blood sugar runs 150   History of kidney stones    Hyperlipidemia    takes Simvastatin daily   Hypertension    takes Losartan daily   Past Surgical History:  Procedure Laterality Date   ABDOMINAL SURGERY     APPENDECTOMY     BACK SURGERY     DENTAL SURGERY  12/10/2014   molar tooth #  [id number]  bone biopsy  of ramus   PARTIAL COLECTOMY     TOOTH EXTRACTION N/A 12/10/2014   Procedure: EXTRACTION MOLARS #14,15,16  INTRAORAL AND EXTRAORAL INCISION AND DRAINAGE AND BONE BIOPSY OF LEFT RAMUS;  Surgeon: Tamela Oddi, DDS;  Location: [place];  Service: Oral Surgery;  Laterality: N/A;   Social History   Socioeconomic History   Marital status: Married    Spouse name: Not on file   Number of children: Not on file   Years of education: Not on file   Highest education level: Not on file  Occupational History   Not on file  Tobacco Use   Smoking status: Former    Packs/day: 1.00    Years: 15.00    Total pack years: 15.00    Types: Cigarettes    Quit date: 02/08/2019    Years since quitting: 2.5   Smokeless tobacco: Never  Vaping Use   Vaping Use: Every day  Substance and Sexual Activity   Alcohol use: Yes    Comment: occasional   Drug use: No   Sexual activity: Yes  Other Topics Concern   Not on file  Social History Narrative   Not on file   Social Determinants of Health   Financial Resource  Strain: Not on file  Food Insecurity: Not on file  Transportation Needs: Not on file  Physical Activity: Not on file  Stress: Not on file  Social Connections: Not on file   Outpatient Encounter Medications as of 09/11/2021  Medication Sig   aspirin EC 81 MG tablet Take 1 tablet (81 mg total) by mouth daily with breakfast. Swallow whole.   Continuous Blood Gluc Sensor (DEXCOM G6 SENSOR) MISC Apply new sensor every 10 days as directed.   Continuous Blood Gluc Transmit (DEXCOM G6 TRANSMITTER) MISC USE TO TEST BLOOD GLUCOSE 4-5 TIMES DAILY   diphenhydrAMINE (BENADRYL) 25 mg capsule Take 1 capsule (25 mg total) by mouth every 6 (six) hours as needed for itching.   furosemide (LASIX) 20 MG tablet Take 20 mg by mouth daily as needed.   glucagon (GLUCAGON EMERGENCY) 1 MG injection Inject 1 mg  into the vein once as needed.   glucose blood (FREESTYLE TEST STRIPS) test strip Use as instructed to monitor glucose 4 times daily, before breakfast and before bed.   insulin aspart (NOVOLOG) 100 UNIT/ML injection Use with Omnipod for TDD around 100 units daily   Insulin Disposable Pump (OMNIPOD 5 G6 INTRO, GEN 5,) KIT Change pod every 48-72 hrs   Insulin Disposable Pump (OMNIPOD 5 G6 POD, GEN 5,) MISC Change pod every 48-72 hrs   metoprolol tartrate (LOPRESSOR) 25 MG tablet Take 1 tablet (25 mg total) by mouth 2 (two) times daily.   Multiple Vitamin (MULTIVITAMIN WITH MINERALS) TABS tablet Take 1 tablet by mouth daily.   NON FORMULARY Taking Total Beet product daily   rosuvastatin (CRESTOR) 20 MG tablet Take 1 tablet (20 mg total) by mouth daily.   sertraline (ZOLOFT) 100 MG tablet Take 1 tablet (100 mg total) by mouth daily.   [DISCONTINUED] Continuous Blood Gluc Receiver (DEXCOM G6 RECEIVER) DEVI Use to check glucose four times daily as directed.   [DISCONTINUED] Continuous Blood Gluc Sensor (DEXCOM G6 SENSOR) MISC Apply new sensor every 10 days as directed.   [DISCONTINUED] Continuous Blood Gluc Transmit  (DEXCOM G6 TRANSMITTER) MISC USE TO TEST BLOOD GLUCOSE 4-5 TIMES DAILY   [DISCONTINUED] insulin aspart (NOVOLOG) 100 UNIT/ML injection Use with Omnipod for TDD around 100 units daily   [DISCONTINUED] Insulin Disposable Pump (OMNIPOD 5 G6 POD, GEN 5,) MISC Change pod every 48-72 hrs   [DISCONTINUED] losartan (COZAAR) 25 MG tablet Take 1 tablet (25 mg total) by mouth daily.   No facility-administered encounter medications on file as of 09/11/2021.    ALLERGIES: Allergies  Allergen Reactions   Bactrim [Sulfamethoxazole-Trimethoprim] Rash    VACCINATION STATUS: Immunization History  Administered Date(s) Administered   Influenza-Unspecified 11/23/2014    Diabetes He presents for his follow-up diabetic visit. He has type 1 diabetes mellitus. Onset time: He was diagnosed at approximate age of 46 years. His disease course has been stable. There are no hypoglycemic associated symptoms. Pertinent negatives for hypoglycemia include no confusion, headaches, pallor or seizures. Pertinent negatives for diabetes include no chest pain, no fatigue, no polydipsia, no polyphagia, no polyuria and no weakness. There are no hypoglycemic complications. Symptoms are stable. There are no diabetic complications. Risk factors for coronary artery disease include dyslipidemia, diabetes mellitus, hypertension, family history, male sex, tobacco exposure and sedentary lifestyle. Current diabetic treatment includes insulin pump. He is compliant with treatment most of the time. His weight is increasing steadily. He is following a generally unhealthy diet. When asked about meal planning, he reported none. He has not had a previous visit with a dietitian. He never participates in exercise. His home blood glucose trend is decreasing steadily. His overall blood glucose range is 180-200 mg/dl. (He presents today with his Omnipod and Dexcom showing improving glycemic profile overall.  His previsit A1c on 6/6 was 7%, essentially  unchanged from previous visit.  Analysis of his CGM shows TIR 66%, TAR 32%, TBR <2% with a GMI of 7.2%.  He does admit to drinking Sundrop and beer, although he has cut back since his recent scare with possible MI.) An ACE inhibitor/angiotensin II receptor blocker is being taken. He sees a podiatrist.Eye exam is current.  Hyperlipidemia This is a chronic problem. The current episode started more than 1 year ago. The problem is uncontrolled. Recent lipid tests were reviewed and are variable. Exacerbating diseases include diabetes and obesity. Factors aggravating his hyperlipidemia include fatty foods. Pertinent negatives  include no chest pain, myalgias or shortness of breath. Current antihyperlipidemic treatment includes statins. The current treatment provides mild improvement of lipids. Compliance problems include adherence to diet and adherence to exercise.  Risk factors for coronary artery disease include diabetes mellitus, dyslipidemia, hypertension, male sex, a sedentary lifestyle and obesity.  Hypertension This is a chronic problem. The current episode started more than 1 year ago. The problem has been gradually improving since onset. The problem is controlled. Pertinent negatives include no chest pain, headaches, neck pain, palpitations or shortness of breath. There are no associated agents to hypertension. Risk factors for coronary artery disease include diabetes mellitus, dyslipidemia, male gender, smoking/tobacco exposure, sedentary lifestyle and obesity. Past treatments include ACE inhibitors and diuretics. The current treatment provides mild improvement. Compliance problems include exercise and diet.     Review of systems  Constitutional: + steadily increasing body weight,  current Body mass index is 29.25 kg/m. , + fatigue, no subjective hyperthermia, no subjective hypothermia Eyes: no blurry vision, no xerophthalmia ENT: no sore throat, no nodules palpated in throat, no  dysphagia/odynophagia, no hoarseness Cardiovascular: no chest pain, no shortness of breath, no palpitations, no leg swelling Respiratory: no cough, no shortness of breath Gastrointestinal: no nausea/vomiting/diarrhea Musculoskeletal: no muscle/joint aches, generalized joint swelling Skin: + rash to upper arms and back-improving, no hyperemia Neurological: no tremors, no numbness, no tingling, no dizziness Psychiatric: no depression, no anxiety   Objective:    BP 136/75   Pulse 85   Ht _0  (1.905 m)   Wt 234 lb (106.1 kg)   BMI 29.25 kg/m   Wt Readings from Last 3 Encounters:  09/11/21 234 lb (106.1 kg)  09/03/21 226 lb (102.5 kg)  08/07/21 226 lb 1.6 oz (102.6 kg)    BP Readings from Last 3 Encounters:  09/11/21 136/75  09/03/21 136/68  08/07/21 (!) 114/52      Physical Exam- Limited  Constitutional:  Body mass index is 29.25 kg/m. , not in acute distress, normal state of mind Eyes:  EOMI, no exophthalmos Neck: Supple Cardiovascular: RRR, no murmurs, rubs, or gallops, no edema Respiratory: Adequate breathing efforts, no crackles, rales, rhonchi, or wheezing Musculoskeletal: no gross deformities, strength intact in all four extremities, no gross restriction of joint movements Skin:  no rashes, no hyperemia Neurological: no tremor with outstretched hands    Recent Results (from the past 2160 hour(s))  Comprehensive metabolic panel     Status: Abnormal   Collection Time: 08/05/21 12:05 PM  Result Value Ref Range   Sodium 136 135 - 145 mmol/L   Potassium 4.6 3.5 - 5.1 mmol/L   Chloride 100 98 - 111 mmol/L   CO2 25 22 - 32 mmol/L   Glucose, Bld 182 (H) 70 - 99 mg/dL    Comment: Glucose reference range applies only to samples taken after fasting for at least 8 hours.   BUN 17 6 - 20 mg/dL   Creatinine, Ser 1.05 0.61 - 1.24 mg/dL   Calcium 9.0 8.9 - 10.3 mg/dL   Total Protein 7.7 6.5 - 8.1 g/dL   Albumin 3.6 3.5 - 5.0 g/dL   AST 57 (H) 15 - 41 U/L   ALT 66 (H)  0 - 44 U/L   Alkaline Phosphatase 131 (H) 38 - 126 U/L   Total Bilirubin 1.4 (H) 0.3 - 1.2 mg/dL   GFR, Estimated >60 >60 mL/min    Comment: (NOTE) Calculated using the CKD-EPI Creatinine Equation (2021)    Anion gap 11 5 -  15    Comment: Performed at [facility], [address] [place], [place] [address]  Troponin I (High Sensitivity)     Status: Abnormal   Collection Time: 08/05/21 12:05 PM  Result Value Ref Range   Troponin I (High Sensitivity) 298 (HH) <18 ng/L    Comment: CRITICAL RESULT CALLED TO, READ BACK BY AND VERIFIED WITH: A GOFF RN [id number] FORSYTH (NOTE) Elevated high sensitivity troponin I (hsTnI) values and significant  changes across serial measurements may suggest ACS but many other  chronic and acute conditions are known to elevate hsTnI results.  Refer to the Links section for chest pain algorithms and additional  guidance. Performed at [facility], [address] [place], [place] [address]   CBC with Differential     Status: Abnormal   Collection Time: 08/05/21 12:05 PM  Result Value Ref Range   WBC 10.9 (H) 4.0 - 10.5 K/uL   RBC 4.11 (L) 4.22 - 5.81 MIL/uL   Hemoglobin 12.2 (L) 13.0 - 17.0 g/dL   HCT 37.3 (L) 39.0 - 52.0 %   MCV 90.8 80.0 - 100.0 fL   MCH 29.7 26.0 - 34.0 pg   MCHC 32.7 30.0 - 36.0 g/dL   RDW 12.1 11.5 - 15.5 %   Platelets 261 150 - 400 K/uL    Comment: REPEATED TO VERIFY   nRBC 0.0 0.0 - 0.2 %   Neutrophils Relative % 83 %   Neutro Abs 9.0 (H) 1.7 - 7.7 K/uL   Lymphocytes Relative 10 %   Lymphs Abs 1.1 0.7 - 4.0 K/uL   Monocytes Relative 6 %   Monocytes Absolute 0.6 0.1 - 1.0 K/uL   Eosinophils Relative 1 %   Eosinophils Absolute 0.1 0.0 - 0.5 K/uL   Basophils Relative 0 %   Basophils Absolute 0.0 0.0 - 0.1 K/uL   Immature Granulocytes 0 %   Abs Immature Granulocytes 0.04 0.00 - 0.07 K/uL    Comment: Performed at [facility], [address], [place], [place] [address]  Lactic acid, plasma     Status: None   Collection  Time: 08/05/21 12:32 PM  Result Value Ref Range   Lactic Acid, Venous 0.8 0.5 - 1.9 mmol/L    Comment: Performed at [facility], [address], [place], [place] [address]  Blood gas, venous     Status: Abnormal   Collection Time: 08/05/21 12:32 PM  Result Value Ref Range   pH, Ven 7.42 7.25 - 7.43   pCO2, Ven 44 44 - 60 mmHg   pO2, Ven <31 (LL) 32 - 45 mmHg    Comment: CRITICAL RESULT CALLED TO, READ BACK BY AND VERIFIED WITH: STEPHEN WOODS @ 1245 ON 08/05/21 C VARNER    Bicarbonate 28.5 (H) 20.0 - 28.0 mmol/L   Acid-Base Excess 3.7 (H) 0.0 - 2.0 mmol/L   O2 Saturation 26.3 %   Patient temperature 37.7    Collection site RIGHT ANTECUBITAL    Drawn by 6000     Comment: Performed at [facility], [address] [place], [place] [address]  Ethanol     Status: None   Collection Time: 08/05/21 12:32 PM  Result Value Ref Range   Alcohol, Ethyl (B) <10 <10 mg/dL    Comment: (NOTE) Lowest detectable limit for serum alcohol is 10 mg/dL.  For medical purposes only. Performed at [facility], [address], [place],  [address]   Lactic acid, plasma     Status: None   Collection  Time: 08/05/21  2:05 PM  Result Value Ref Range   Lactic Acid, Venous 1.0 0.5 - 1.9 mmol/L    Comment: Performed at [facility], [address], [place], [place] [address]  Troponin I (High Sensitivity)     Status: Abnormal   Collection Time: 08/05/21  2:05 PM  Result Value Ref Range   Troponin I (High Sensitivity) 271 (HH) <18 ng/L    Comment: DELTA CHECK NOTED CRITICAL VALUE NOTED.  VALUE IS CONSISTENT WITH PREVIOUSLY REPORTED AND CALLED VALUE. (NOTE) Elevated high sensitivity troponin I (hsTnI) values and significant  changes across serial measurements may suggest ACS but many other  chronic and acute conditions are known to elevate hsTnI results.  Refer to the Links section for chest pain algorithms and additional  guidance. Performed at [facility], [address], [place], [place] [address]    Hemoglobin A1c     Status: Abnormal   Collection Time: 08/05/21  3:02 PM  Result Value Ref Range   Hgb A1c MFr Bld 7.0 (H) 4.8 - 5.6 %    Comment: (NOTE) Pre diabetes:          5.7%-6.4%  Diabetes:              >6.4%  Glycemic control for   <7.0% adults with diabetes    Mean Plasma Glucose 154.2 mg/dL    Comment: Performed at [facility], [address] [place], [place] [address]  HIV Antibody (routine testing w rflx)     Status: None   Collection Time: 08/05/21  3:02 PM  Result Value Ref Range   HIV Screen 4th Generation wRfx Non Reactive Non Reactive    Comment: Performed at [facility], [address], [place], [place] [address]  Glucose, capillary     Status: Abnormal   Collection Time: 08/05/21  4:56 PM  Result Value Ref Range   Glucose-Capillary 120 (H) 70 - 99 mg/dL    Comment: Glucose reference range applies only to samples taken after fasting for at least 8 hours.  Culture, blood (Routine X 2) w Reflex to ID Panel     Status: None   Collection Time: 08/05/21  6:12 PM   Specimen: BLOOD RIGHT HAND  Result Value Ref Range   Specimen Description BLOOD RIGHT HAND    Special Requests      BOTTLES DRAWN AEROBIC AND ANAEROBIC Blood Culture results may not be optimal due to an excessive volume of blood received in culture bottles   Culture      NO GROWTH 5 DAYS Performed at [facility], [address] [place], [place] [address]    Report Status 08/10/2021 FINAL   Culture, blood (Routine X 2) w Reflex to ID Panel     Status: None   Collection Time: 08/05/21  6:12 PM   Specimen: Right Antecubital; Blood  Result Value Ref Range   Specimen Description RIGHT ANTECUBITAL    Special Requests      BOTTLES DRAWN AEROBIC AND ANAEROBIC Blood Culture results may not be optimal due to an excessive volume of blood received in culture bottles   Culture      NO GROWTH 5 DAYS Performed at [facility], [address], [place], [place] [address]    Report Status  08/10/2021 FINAL   Glucose, capillary     Status: Abnormal   Collection Time: 08/05/21  8:32 PM  Result Value Ref Range   Glucose-Capillary 318 (H) 70 - 99 mg/dL  Comment: Glucose reference range applies only to samples taken after fasting for at least 8 hours.   Comment 1 Notify RN    Comment 2 Document in Chart   Heparin level (unfractionated)     Status: Abnormal   Collection Time: 08/05/21 10:11 PM  Result Value Ref Range   Heparin Unfractionated 0.12 (L) 0.30 - 0.70 IU/mL    Comment: (NOTE) The clinical reportable range upper limit is being lowered to >1.10 to align with the FDA approved guidance for the current laboratory assay.  If heparin results are below expected values, and patient dosage has  been confirmed, suggest follow up testing of antithrombin III levels. Performed at [facility], [address], [place], [place] [address]   Glucose, capillary     Status: None   Collection Time: 08/06/21 12:35 AM  Result Value Ref Range   Glucose-Capillary 74 70 - 99 mg/dL    Comment: Glucose reference range applies only to samples taken after fasting for at least 8 hours.   Comment 1 Notify RN    Comment 2 Document in Chart   CBC     Status: Abnormal   Collection Time: 08/06/21  4:27 AM  Result Value Ref Range   WBC 9.4 4.0 - 10.5 K/uL   RBC 3.47 (L) 4.22 - 5.81 MIL/uL   Hemoglobin 10.3 (L) 13.0 - 17.0 g/dL   HCT 31.0 (L) 39.0 - 52.0 %   MCV 89.3 80.0 - 100.0 fL   MCH 29.7 26.0 - 34.0 pg   MCHC 33.2 30.0 - 36.0 g/dL   RDW 12.3 11.5 - 15.5 %   Platelets 238 150 - 400 K/uL   nRBC 0.0 0.0 - 0.2 %    Comment: Performed at [facility], [address], [place], [place] [address]  Lipid panel     Status: Abnormal   Collection Time: 08/06/21  4:27 AM  Result Value Ref Range   Cholesterol 120 0 - 200 mg/dL   Triglycerides 138 <150 mg/dL   HDL 40 (L) >40 mg/dL   Total CHOL/HDL Ratio 3.0 RATIO   VLDL 28 0 - 40 mg/dL   LDL Cholesterol 52 0 - 99 mg/dL    Comment:         Total Cholesterol/HDL:CHD Risk Coronary Heart Disease Risk Table                     Men   Women  1/2 Average Risk   3.4   3.3  Average Risk       5.0   4.4  2 X Average Risk   9.6   7.1  3 X Average Risk  23.4   11.0        Use the calculated Patient Ratio above and the CHD Risk Table to determine the patient's CHD Risk.        ATP III CLASSIFICATION (LDL):  <100     mg/dL   Optimal  100-129  mg/dL   Near or Above                    Optimal  130-159  mg/dL   Borderline  160-189  mg/dL   High  >190     mg/dL   Very High Performed at [facility] [place], [place] [address]   TSH     Status: None   Collection Time: 08/06/21  4:27 AM  Result Value Ref Range   TSH 2.671 0.350 -  4.500 uIU/mL    Comment: Performed by a 3rd Generation assay with a functional sensitivity of <=0.01 uIU/mL. Performed at [facility], [address] [place], [place] [address]   Basic metabolic panel     Status: Abnormal   Collection Time: 08/06/21  4:27 AM  Result Value Ref Range   Sodium 134 (L) 135 - 145 mmol/L   Potassium 4.5 3.5 - 5.1 mmol/L   Chloride 102 98 - 111 mmol/L   CO2 25 22 - 32 mmol/L   Glucose, Bld 134 (H) 70 - 99 mg/dL    Comment: Glucose reference range applies only to samples taken after fasting for at least 8 hours.   BUN 24 (H) 6 - 20 mg/dL   Creatinine, Ser 1.11 0.61 - 1.24 mg/dL   Calcium 8.3 (L) 8.9 - 10.3 mg/dL   GFR, Estimated >60 >60 mL/min    Comment: (NOTE) Calculated using the CKD-EPI Creatinine Equation (2021)    Anion gap 7 5 - 15    Comment: Performed at [facility], [address], [place], [place] [address]  Heparin level (unfractionated)     Status: Abnormal   Collection Time: 08/06/21  4:27 AM  Result Value Ref Range   Heparin Unfractionated 0.14 (L) 0.30 - 0.70 IU/mL    Comment: (NOTE) The clinical reportable range upper limit is being lowered to >1.10 to align with the FDA approved guidance for the current laboratory assay.  If  heparin results are below expected values, and patient dosage has  been confirmed, suggest follow up testing of antithrombin III levels. Performed at [facility], [address] [place], [place] [address]   Troponin I (High Sensitivity)     Status: Abnormal   Collection Time: 08/06/21  4:38 AM  Result Value Ref Range   Troponin I (High Sensitivity) 103 (HH) <18 ng/L    Comment: CRITICAL VALUE NOTED.  VALUE IS CONSISTENT WITH PREVIOUSLY REPORTED AND CALLED VALUE. (NOTE) Elevated high sensitivity troponin I (hsTnI) values and significant  changes across serial measurements may suggest ACS but many other  chronic and acute conditions are known to elevate hsTnI results.  Refer to the Links section for chest pain algorithms and additional  guidance. Performed at [facility], [address] [place], [place] [address]   Glucose, capillary     Status: Abnormal   Collection Time: 08/06/21  7:51 AM  Result Value Ref Range   Glucose-Capillary 135 (H) 70 - 99 mg/dL    Comment: Glucose reference range applies only to samples taken after fasting for at least 8 hours.  ECHOCARDIOGRAM COMPLETE     Status: None   Collection Time: 08/06/21  9:36 AM  Result Value Ref Range   Weight 3,714.31 oz   Height 76 in   BP 116/61 mmHg   Single Plane A2C EF 54.8 %   Single Plane A4C EF 60.3 %   Calc EF 57.5 %   AR max vel 2.99 cm2   AV Area VTI 3.07 cm2   AV Mean grad 3.0 mmHg   AV Peak grad 4.6 mmHg   Ao pk vel 1.07 m/s   AV Area mean vel 2.69 cm2   MV VTI 2.69 cm2   Area-P 1/2 3.91 cm2   S' Lateral 3.40 cm  Glucose, capillary     Status: Abnormal   Collection Time: 08/06/21 11:54 AM  Result Value Ref Range   Glucose-Capillary 112 (H) 70 - 99 mg/dL    Comment: Glucose reference range applies  only to samples taken after fasting for at least 8 hours.  Heparin level (unfractionated)     Status: Abnormal   Collection Time: 08/06/21  1:59 PM  Result Value Ref Range   Heparin Unfractionated 0.17 (L)  0.30 - 0.70 IU/mL    Comment: (NOTE) The clinical reportable range upper limit is being lowered to >1.10 to align with the FDA approved guidance for the current laboratory assay.  If heparin results are below expected values, and patient dosage has  been confirmed, suggest follow up testing of antithrombin III levels. Performed at [facility], [address] [place], [place] [address]   Ehrlichia antibody panel     Status: None   Collection Time: 08/06/21  2:48 PM  Result Value Ref Range   E chaffeensis (HGE) Ab, IgG Negative Neg:<1:64    Comment: (NOTE) HGE IgG levels are detectable 7 to 10 days post infection and persist approximately one year.    E chaffeensis (HGE) Ab, IgM Negative Neg:<1:20    Comment: (NOTE) Due to a reagent backorder, this test was performed using a different assay. The reference interval for this alternate assay is:                                       Negative      <1:64                                       Positive       1:64 or greater IgM levels usually rise 3 to 5 days post infection and fall to normal levels in approximately 30 to 60 days. Performed At: [facility] [place], [place] [id number] Rush Farmer MD FU:[id number]    E.Chaffeensis (HME) IgG Negative Neg:<1:64   E. Chaffeensis (HME) IgM Titer Negative Neg:<1:20    Comment: (NOTE) IgG titers if 1:64 or greater indicate exposure or  acute and convalescent samples showing a four-fold increase, and/or the presence of IgM indicate recent or current infection.   Rocky mtn spotted fvr abs pnl(IgG+IgM)     Status: None   Collection Time: 08/06/21  2:48 PM  Result Value Ref Range   RMSF IgG Negative Negative   RMSF IgM 0.44 0.00 - 0.89 index    Comment: (NOTE)                                 Negative        <0.90                                 Equivocal 0.90 - 1.10                                 Positive        >1.10 Performed At: [facility] [address] [place], [place] [id number] Rush Farmer MD BJ:[id number]   Lyme Disease Serology w/Reflex     Status: None   Collection Time: 08/06/21  2:48 PM  Result Value Ref Range   Lyme Total Antibody EIA Negative Negative    Comment: (NOTE) Lyme antibodies not detected. Reflex  testing is not indicated. No laboratory evidence of infection with B. burgdorferi (Lyme disease). Negative results may occur in patients recently infected (less than or equal to 14 days) with B. burgdorferi.  If recent infection is suspected, repeat testing on a new sample collected in 7 to 14 days is recommended. Performed At: [facility] [place], [place] [id number] Rush Farmer MD LG:[id number]   Glucose, capillary     Status: Abnormal   Collection Time: 08/06/21  4:39 PM  Result Value Ref Range   Glucose-Capillary 183 (H) 70 - 99 mg/dL    Comment: Glucose reference range applies only to samples taken after fasting for at least 8 hours.  Heparin level (unfractionated)     Status: Abnormal   Collection Time: 08/06/21  8:55 PM  Result Value Ref Range   Heparin Unfractionated 0.16 (L) 0.30 - 0.70 IU/mL    Comment: (NOTE) The clinical reportable range upper limit is being lowered to >1.10 to align with the FDA approved guidance for the current laboratory assay.  If heparin results are below expected values, and patient dosage has  been confirmed, suggest follow up testing of antithrombin III levels. Performed at [facility], [address], [place], [place] [address]   Glucose, capillary     Status: Abnormal   Collection Time: 08/06/21  9:32 PM  Result Value Ref Range   Glucose-Capillary 477 (H) 70 - 99 mg/dL    Comment: Glucose reference range applies only to samples taken after fasting for at least 8 hours.  Glucose, capillary     Status: Abnormal   Collection Time: 08/06/21 11:49 PM  Result Value Ref Range   Glucose-Capillary 395 (H) 70 - 99 mg/dL    Comment: Glucose  reference range applies only to samples taken after fasting for at least 8 hours.  CBC     Status: Abnormal   Collection Time: 08/07/21  4:48 AM  Result Value Ref Range   WBC 7.8 4.0 - 10.5 K/uL   RBC 3.55 (L) 4.22 - 5.81 MIL/uL   Hemoglobin 10.5 (L) 13.0 - 17.0 g/dL   HCT 31.5 (L) 39.0 - 52.0 %   MCV 88.7 80.0 - 100.0 fL   MCH 29.6 26.0 - 34.0 pg   MCHC 33.3 30.0 - 36.0 g/dL   RDW 11.9 11.5 - 15.5 %   Platelets 273 150 - 400 K/uL   nRBC 0.0 0.0 - 0.2 %    Comment: Performed at [facility], [address], [place], [place] [address]  Basic metabolic panel     Status: Abnormal   Collection Time: 08/07/21  4:48 AM  Result Value Ref Range   Sodium 136 135 - 145 mmol/L   Potassium 4.5 3.5 - 5.1 mmol/L   Chloride 104 98 - 111 mmol/L   CO2 24 22 - 32 mmol/L   Glucose, Bld 223 (H) 70 - 99 mg/dL    Comment: Glucose reference range applies only to samples taken after fasting for at least 8 hours.   BUN 20 6 - 20 mg/dL   Creatinine, Ser 0.99 0.61 - 1.24 mg/dL   Calcium 8.4 (L) 8.9 - 10.3 mg/dL   GFR, Estimated >60 >60 mL/min    Comment: (NOTE) Calculated using the CKD-EPI Creatinine Equation (2021)    Anion gap 8 5 - 15    Comment: Performed at [facility], [address] [place], [place] [address]  Heparin level (unfractionated)     Status: None   Collection Time:  08/07/21  4:48 AM  Result Value Ref Range   Heparin Unfractionated 0.41 0.30 - 0.70 IU/mL    Comment: (NOTE) The clinical reportable range upper limit is being lowered to >1.10 to align with the FDA approved guidance for the current laboratory assay.  If heparin results are below expected values, and patient dosage has  been confirmed, suggest follow up testing of antithrombin III levels. Performed at [facility], [address] [place], [place] [address]   Glucose, capillary     Status: Abnormal   Collection Time: 08/07/21  7:19 AM  Result Value Ref Range   Glucose-Capillary 221 (H) 70 - 99 mg/dL    Comment:  Glucose reference range applies only to samples taken after fasting for at least 8 hours.  NM Myocar Multi W/Spect W/Wall Motion / EF     Status: None   Collection Time: 08/07/21  9:39 AM  Result Value Ref Range   Rest HR 85.0 bpm   Rest BP 125/70 mmHg   Peak HR 101 bpm   Peak BP 141/54 mmHg   Rest Nuclear Isotope Dose 11.0 mCi   Stress Nuclear Isotope Dose 32.1 mCi   SSS 2.0    SRS 0.0    SDS 2.0    TID 1.17    LV sys vol 51.0 mL   LV dias vol 141.0 62 - 150 mL   RATE 0.4    Nuc Stress EF 64 %   ST Depression (mm) 0 mm  Glucose, capillary     Status: Abnormal   Collection Time: 08/07/21 11:04 AM  Result Value Ref Range   Glucose-Capillary 304 (H) 70 - 99 mg/dL    Comment: Glucose reference range applies only to samples taken after fasting for at least 8 hours.  Heparin level (unfractionated)     Status: Abnormal   Collection Time: 08/07/21 11:15 AM  Result Value Ref Range   Heparin Unfractionated 0.23 (L) 0.30 - 0.70 IU/mL    Comment: (NOTE) The clinical reportable range upper limit is being lowered to >1.10 to align with the FDA approved guidance for the current laboratory assay.  If heparin results are below expected values, and patient dosage has  been confirmed, suggest follow up testing of antithrombin III levels. Performed at [facility], [address], [place], [place] [address]       Assessment & Plan:   1) Uncontrolled type 1 diabetes mellitus with hyperglycemia ([place])  - BEDFORD WINSOR has currently uncontrolled symptomatic type 1 DM since 46 years of age.  He denies any history of diabetes ketoacidosis.  He presents today with his Omnipod and Dexcom showing improving glycemic profile overall.  His previsit A1c on 6/6 was 7%, essentially unchanged from previous visit.  Analysis of his CGM shows TIR 66%, TAR 32%, TBR <2% with a GMI of 7.2%.  He does admit to drinking Sundrop and beer, although he has cut back since his recent scare with possible MI.  -his  diabetes is complicated by chronic heavy smoking and JAYCUB NOORANI remains at a high risk for more acute and chronic complications which include CAD, CVA, CKD, retinopathy, and neuropathy. These are all discussed in detail with the patient.  - Nutritional counseling repeated at each appointment due to patients tendency to fall back in to old habits.  - The patient admits there is a room for improvement in their diet and drink choices. -  Suggestion is made for the patient to avoid simple carbohydrates from  their diet including Cakes, Sweet Desserts / Pastries, Ice Cream, Soda (diet and regular), Sweet Tea, Candies, Chips, Cookies, Sweet Pastries, Store Bought Juices, Alcohol in Excess of 1-2 drinks a day, Artificial Sweeteners, Coffee Creamer, and "Sugar-free" Products. This will help patient to have stable blood glucose profile and potentially avoid unintended weight gain.   - I encouraged the patient to switch to unprocessed or minimally processed complex starch and increased protein intake (animal or plant source), fruits, and vegetables.   - Patient is advised to stick to a routine mealtimes to eat 3 meals a day and avoid unnecessary snacks (to snack only to correct hypoglycemia).  - I have approached him with the following individualized plan to manage diabetes and patient agrees:   -He has clearly benefited from his CGM device, he is advised to continue using it.  -He is doing well with his Omnipod 5 switch.  He is advised to continue current pump settings.  He will benefit most from avoidance of sugary beverages.  -He is advised to continue monitoring blood glucose 4 times daily, before meals and before bedtime using his CGM, and call the clinic if he has readings less than 70 or greater than 300 for 3 tests in a row.   - Patient is warned not to take insulin without proper monitoring per orders. -Adjustment parameters are given for hypo and hyperglycemia in writing.  - Patient  specific target  A1c;  LDL, HDL, Triglycerides  were discussed in detail.  2) BP/HTN:  Blood pressure is controlled to target.  He is advised to continue his current blood pressure medications including Losartan 25 mg p.o. daily and Lasix 20 mg po daily.  Will recheck kidney function prior to next visit.  3) Lipids/HPL:  His most recent lipid panel from 02/07/21 shows controlled LDL of 97 and elevated triglycerides of 393.  He is advised to continue Crestor 20 mg po daily at bedtime and his Omega 3 Fatty acids.    Also advised him to avoid fried foods and butter.     4)  Weight/Diet: His Body mass index is 29.25 kg/m.-not a candidate for major weight loss.  CDE Consult has been initiated , exercise, and detailed carbohydrates information provided.  5) Chronic Care/Health Maintenance: -he is on ARB and Statin medications and is encouraged to initiate and continue to follow up with Ophthalmology, Dentist,  Podiatrist at least yearly or according to recommendations, and he is advised to stay away from smoking.  I have recommended yearly flu vaccine and pneumonia vaccine at least every 5 years; moderate intensity exercise for up to 150 minutes weekly; and sleep for at least 7 hours a day.  - I advised patient to maintain close follow up with [facility] for primary care needs.    I spent 44 minutes in the care of the patient today including review of labs from [place], Lipids, Thyroid Function, Hematology (current and previous including abstractions from other facilities); face-to-face time discussing  his blood glucose readings/logs, discussing hypoglycemia and hyperglycemia episodes and symptoms, medications doses, his options of short and long term treatment based on the latest standards of care / guidelines;  discussion about incorporating lifestyle medicine;  and documenting the encounter. Risk reduction counseling performed per USPSTF guidelines to reduce obesity and  cardiovascular risk factors.     Please refer to Patient Instructions for Blood Glucose Monitoring and Insulin/Medications Dosing Guide"  in media tab for additional information. Please  also refer to "  Patient Self Inventory" in the Media  tab for reviewed elements of pertinent patient history.  Delmar Landau participated in the discussions, expressed understanding, and voiced agreement with the above plans.  All questions were answered to his satisfaction. he is encouraged to contact clinic should he have any questions or concerns prior to his return visit.    Follow up plan: - Return in about 3 months (around 12/12/2021) for Diabetes F/U with A1c in office, No previsit labs, Bring meter and logs.  Rayetta Pigg, [facility] [address] [place], [place] [address] Phone: [phone] Fax: [phone]  09/11/2021, 3:31 PM

## 2021-09-24 ENCOUNTER — Inpatient Hospital Stay (HOSPITAL_COMMUNITY): Admission: RE | Admit: 2021-09-24 | Payer: BC Managed Care – PPO | Source: Ambulatory Visit

## 2021-10-07 DIAGNOSIS — M79672 Pain in left foot: Secondary | ICD-10-CM | POA: Diagnosis not present

## 2021-10-07 DIAGNOSIS — M79641 Pain in right hand: Secondary | ICD-10-CM | POA: Diagnosis not present

## 2021-10-07 DIAGNOSIS — M79642 Pain in left hand: Secondary | ICD-10-CM | POA: Diagnosis not present

## 2021-10-07 DIAGNOSIS — M2560 Stiffness of unspecified joint, not elsewhere classified: Secondary | ICD-10-CM | POA: Diagnosis not present

## 2021-10-07 DIAGNOSIS — M25532 Pain in left wrist: Secondary | ICD-10-CM | POA: Diagnosis not present

## 2021-10-07 DIAGNOSIS — M25439 Effusion, unspecified wrist: Secondary | ICD-10-CM | POA: Diagnosis not present

## 2021-10-07 DIAGNOSIS — M25531 Pain in right wrist: Secondary | ICD-10-CM | POA: Diagnosis not present

## 2021-10-07 DIAGNOSIS — M79671 Pain in right foot: Secondary | ICD-10-CM | POA: Diagnosis not present

## 2021-10-21 DIAGNOSIS — M79642 Pain in left hand: Secondary | ICD-10-CM | POA: Diagnosis not present

## 2021-10-21 DIAGNOSIS — Z79899 Other long term (current) drug therapy: Secondary | ICD-10-CM | POA: Diagnosis not present

## 2021-10-21 DIAGNOSIS — M79672 Pain in left foot: Secondary | ICD-10-CM | POA: Diagnosis not present

## 2021-10-21 DIAGNOSIS — M79641 Pain in right hand: Secondary | ICD-10-CM | POA: Diagnosis not present

## 2021-10-21 DIAGNOSIS — M79671 Pain in right foot: Secondary | ICD-10-CM | POA: Diagnosis not present

## 2021-10-21 DIAGNOSIS — L4050 Arthropathic psoriasis, unspecified: Secondary | ICD-10-CM | POA: Diagnosis not present

## 2021-10-21 DIAGNOSIS — M25532 Pain in left wrist: Secondary | ICD-10-CM | POA: Diagnosis not present

## 2021-11-05 ENCOUNTER — Other Ambulatory Visit: Payer: Self-pay | Admitting: Nurse Practitioner

## 2021-11-05 DIAGNOSIS — M25532 Pain in left wrist: Secondary | ICD-10-CM | POA: Diagnosis not present

## 2021-11-05 DIAGNOSIS — M25439 Effusion, unspecified wrist: Secondary | ICD-10-CM | POA: Diagnosis not present

## 2021-11-05 DIAGNOSIS — M25531 Pain in right wrist: Secondary | ICD-10-CM | POA: Diagnosis not present

## 2021-11-05 DIAGNOSIS — L4050 Arthropathic psoriasis, unspecified: Secondary | ICD-10-CM | POA: Diagnosis not present

## 2021-12-09 DIAGNOSIS — I872 Venous insufficiency (chronic) (peripheral): Secondary | ICD-10-CM | POA: Diagnosis not present

## 2021-12-09 DIAGNOSIS — E1142 Type 2 diabetes mellitus with diabetic polyneuropathy: Secondary | ICD-10-CM | POA: Diagnosis not present

## 2021-12-15 ENCOUNTER — Ambulatory Visit: Payer: BC Managed Care – PPO | Admitting: Nurse Practitioner

## 2021-12-16 ENCOUNTER — Encounter: Payer: Self-pay | Admitting: Nurse Practitioner

## 2021-12-16 ENCOUNTER — Ambulatory Visit (INDEPENDENT_AMBULATORY_CARE_PROVIDER_SITE_OTHER): Payer: BC Managed Care – PPO | Admitting: Nurse Practitioner

## 2021-12-16 VITALS — BP 134/80 | HR 92 | Ht 75.0 in | Wt 234.6 lb

## 2021-12-16 DIAGNOSIS — E1065 Type 1 diabetes mellitus with hyperglycemia: Secondary | ICD-10-CM

## 2021-12-16 LAB — POCT GLYCOSYLATED HEMOGLOBIN (HGB A1C): Hemoglobin A1C: 7 % — AB (ref 4.0–5.6)

## 2021-12-16 NOTE — Progress Notes (Signed)
12/16/2021, 4:25 PM             Endocrinology follow-up note   Subjective:    Patient ID: Jesse Rodgers, male    DOB: Sep 16, 1975.  Jesse Rodgers is being seen in follow-up for management of currently uncontrolled symptomatic diabetes requested by  [facility].   Past Medical History:  Diagnosis Date   Arthritis    back    Depression    takes Zoloft daily   Diabetes mellitus without complication ([place])    takes Toujeo and Novolog nightly;average fasting blood sugar runs 150   History of kidney stones    Hyperlipidemia    takes Simvastatin daily   Hypertension    takes Losartan daily   Past Surgical History:  Procedure Laterality Date   ABDOMINAL SURGERY     APPENDECTOMY     BACK SURGERY     DENTAL SURGERY  12/10/2014   molar tooth #  [id number]  bone biopsy  of ramus   PARTIAL COLECTOMY     TOOTH EXTRACTION N/A 12/10/2014   Procedure: EXTRACTION MOLARS #14,15,16  INTRAORAL AND EXTRAORAL INCISION AND DRAINAGE AND BONE BIOPSY OF LEFT RAMUS;  Surgeon: Tamela Oddi, DDS;  Location: [place];  Service: Oral Surgery;  Laterality: N/A;   Social History   Socioeconomic History   Marital status: Married    Spouse name: Not on file   Number of children: Not on file   Years of education: Not on file   Highest education level: Not on file  Occupational History   Not on file  Tobacco Use   Smoking status: Former    Packs/day: 1.00    Years: 15.00    Total pack years: 15.00    Types: Cigarettes    Quit date: 02/08/2019    Years since quitting: 2.8   Smokeless tobacco: Never  Vaping Use   Vaping Use: Every day  Substance and Sexual Activity   Alcohol use: Yes    Comment: occasional   Drug use: No   Sexual activity: Yes  Other Topics Concern   Not on file  Social History Narrative   Not on file   Social Determinants of Health   Financial Resource  Strain: Not on file  Food Insecurity: Not on file  Transportation Needs: Not on file  Physical Activity: Not on file  Stress: Not on file  Social Connections: Not on file   Outpatient Encounter Medications as of 12/16/2021  Medication Sig   aspirin EC 81 MG tablet Take 1 tablet (81 mg total) by mouth daily with breakfast. Swallow whole.   Continuous Blood Gluc Sensor (DEXCOM G6 SENSOR) MISC Apply new sensor every 10 days as directed.   Continuous Blood Gluc Transmit (DEXCOM G6 TRANSMITTER) MISC USE TO TEST BLOOD GLUCOSE 4-5 TIMES DAILY   diphenhydrAMINE (BENADRYL) 25 mg capsule Take 1 capsule (25 mg total) by mouth every 6 (six) hours as needed for itching.   furosemide (LASIX) 20 MG tablet Take 20 mg by mouth daily as needed.   glucagon (GLUCAGON EMERGENCY) 1 MG injection Inject 1 mg  into the vein once as needed.   glucose blood (FREESTYLE TEST STRIPS) test strip Use as instructed to monitor glucose 4 times daily, before breakfast and before bed.   HUMIRA PEN 40 MG/0.4ML PNKT SMARTSIG:40 Milligram(s) SUB-Q Every 2 Weeks   insulin aspart (NOVOLOG) 100 UNIT/ML injection Use with Omnipod for TDD around 100 units daily   Insulin Disposable Pump (OMNIPOD 5 G6 INTRO, GEN 5,) KIT Change pod every 48-72 hrs   Insulin Disposable Pump (OMNIPOD 5 G6 POD, GEN 5,) MISC Change pod every 48-72 hrs   metoprolol tartrate (LOPRESSOR) 25 MG tablet Take 1 tablet (25 mg total) by mouth 2 (two) times daily.   Multiple Vitamin (MULTIVITAMIN WITH MINERALS) TABS tablet Take 1 tablet by mouth daily.   NON FORMULARY Taking Total Beet product daily   rosuvastatin (CRESTOR) 20 MG tablet TAKE 1 TABLET(20 MG) BY MOUTH DAILY   sertraline (ZOLOFT) 100 MG tablet Take 1 tablet (100 mg total) by mouth daily.   No facility-administered encounter medications on file as of 12/16/2021.    ALLERGIES: Allergies  Allergen Reactions   Bactrim [Sulfamethoxazole-Trimethoprim] Rash    VACCINATION STATUS: Immunization History   Administered Date(s) Administered   Influenza-Unspecified 11/23/2046    Diabetes He presents for his follow-up diabetic visit. He has type 1 diabetes mellitus. Onset time: He was diagnosed at approximate age of 12 years. His disease course has been stable. There are no hypoglycemic associated symptoms. Pertinent negatives for hypoglycemia include no confusion, headaches, pallor or seizures. Pertinent negatives for diabetes include no chest pain, no fatigue, no polydipsia, no polyphagia, no polyuria and no weakness. There are no hypoglycemic complications. Symptoms are stable. There are no diabetic complications. Risk factors for coronary artery disease include dyslipidemia, diabetes mellitus, hypertension, family history, male sex, tobacco exposure and sedentary lifestyle. Current diabetic treatment includes insulin pump. He is compliant with treatment most of the time. His weight is fluctuating minimally. He is following a generally unhealthy diet. When asked about meal planning, he reported none. He has not had a previous visit with a dietitian. He never participates in exercise. His home blood glucose trend is fluctuating minimally. His overall blood glucose range is 140-180 mg/dl. (He presents today with his CGM/Omnipod combo showing stable glycemic profile overall.  His POCT A1c today is 7%, unchanged from previous visit.  Analysis of his CGM shows TIR 57%, TAR 42%, TBR <1% with a GMI of 7.5%.  He notes he has been eating night time bowl of cereal contributing to his higher night time readings.) An ACE inhibitor/angiotensin II receptor blocker is being taken. He sees a podiatrist.Eye exam is current.  Hyperlipidemia This is a chronic problem. The current episode started more than 1 year ago. The problem is uncontrolled. Recent lipid tests were reviewed and are variable. Exacerbating diseases include diabetes and obesity. Factors aggravating his hyperlipidemia include fatty foods. Pertinent negatives  include no chest pain, myalgias or shortness of breath. Current antihyperlipidemic treatment includes statins. The current treatment provides mild improvement of lipids. Compliance problems include adherence to diet and adherence to exercise.  Risk factors for coronary artery disease include diabetes mellitus, dyslipidemia, hypertension, male sex, a sedentary lifestyle and obesity.  Hypertension This is a chronic problem. The current episode started more than 1 year ago. The problem has been gradually improving since onset. The problem is controlled. Pertinent negatives include no chest pain, headaches, neck pain, palpitations or shortness of breath. There are no associated agents to hypertension. Risk factors for coronary artery  disease include diabetes mellitus, dyslipidemia, male gender, smoking/tobacco exposure, sedentary lifestyle and obesity. Past treatments include ACE inhibitors and diuretics. The current treatment provides mild improvement. Compliance problems include exercise and diet.     Review of systems  Constitutional: + stable body weight,  current Body mass index is 29.32 kg/m. , + fatigue, no subjective hyperthermia, no subjective hypothermia Eyes: no blurry vision, no xerophthalmia ENT: no sore throat, no nodules palpated in throat, no dysphagia/odynophagia, no hoarseness Cardiovascular: no chest pain, no shortness of breath, no palpitations, no leg swelling Respiratory: no cough, no shortness of breath Gastrointestinal: no nausea/vomiting/diarrhea Musculoskeletal: no muscle/joint aches, generalized joint swelling Skin: no rashes, no hyperemia Neurological: no tremors, no numbness, no tingling, no dizziness Psychiatric: no depression, no anxiety   Objective:    BP 134/80 (BP Location: Right Arm, Patient Position: Sitting, Cuff Size: Large)   Pulse 92   Ht _0  (1.905 m)   Wt 234 lb 9.6 oz (106.4 kg)   BMI 29.32 kg/m   Wt Readings from Last 3 Encounters:  12/16/21 234  lb 9.6 oz (106.4 kg)  09/11/21 234 lb (106.1 kg)  09/03/21 226 lb (102.5 kg)    BP Readings from Last 3 Encounters:  12/16/21 134/80  09/11/21 136/75  09/03/21 136/68     Physical Exam- Limited  Constitutional:  Body mass index is 29.32 kg/m. , not in acute distress, normal state of mind Eyes:  EOMI, no exophthalmos Neck: Supple Cardiovascular: RRR, no murmurs, rubs, or gallops, no edema Respiratory: Adequate breathing efforts, no crackles, rales, rhonchi, or wheezing Musculoskeletal: no gross deformities, strength intact in all four extremities, no gross restriction of joint movements Skin:  no rashes, no hyperemia Neurological: no tremor with outstretched hands    Recent Results (from the past 2160 hour(s))  HgB A1c     Status: Abnormal   Collection Time: 12/16/21  4:06 PM  Result Value Ref Range   Hemoglobin A1C 7.0 (A) 4.0 - 5.6 %   HbA1c POC (<> result, manual entry)     HbA1c, POC (prediabetic range)     HbA1c, POC (controlled diabetic range)         Assessment & Plan:   1) Controlled type 1 diabetes mellitus without complications (H[facility])  - Jesse Rodgers has currently uncontrolled symptomatic type 1 DM since 46 years of age.  He denies any history of diabetes ketoacidosis.  He presents today with his CGM/Omnipod combo showing stable glycemic profile overall.  His POCT A1c today is 7%, unchanged from previous visit.  Analysis of his CGM shows TIR 57%, TAR 42%, TBR <1% with a GMI of 7.5%.  He notes he has been eating night time bowl of cereal contributing to his higher night time readings.  -his diabetes is complicated by chronic heavy smoking and FELIBERTO STOCKLEY remains at a high risk for more acute and chronic complications which include CAD, CVA, CKD, retinopathy, and neuropathy. These are all discussed in detail with the patient.  - Nutritional counseling repeated at each appointment due to patients tendency to fall back in to old habits.  - The patient  admits there is a room for improvement in their diet and drink choices. -  Suggestion is made for the patient to avoid simple carbohydrates from their diet including Cakes, Sweet Desserts / Pastries, Ice Cream, Soda (diet and regular), Sweet Tea, Candies, Chips, Cookies, Sweet Pastries, Store Bought Juices, Alcohol in Excess of 1-2 drinks a day, Artificial Sweeteners, Coffee Creamer,  and "Sugar-free" Products. This will help patient to have stable blood glucose profile and potentially avoid unintended weight gain.   - I encouraged the patient to switch to unprocessed or minimally processed complex starch and increased protein intake (animal or plant source), fruits, and vegetables.   - Patient is advised to stick to a routine mealtimes to eat 3 meals a day and avoid unnecessary snacks (to snack only to correct hypoglycemia).  - I have approached him with the following individualized plan to manage diabetes and patient agrees:   -He has clearly benefited from his CGM device, he is advised to continue using it.  -He is doing well with his Omnipod 5 switch.  I did make slight adjustment to his Target BG today to 110 (instead of 120).  -He is advised to continue monitoring blood glucose 4 times daily, before meals and before bedtime using his CGM, and call the clinic if he has readings less than 70 or greater than 300 for 3 tests in a row.   - Patient is warned not to take insulin without proper monitoring per orders. -Adjustment parameters are given for hypo and hyperglycemia in writing.  - Patient specific target  A1c;  LDL, HDL, Triglycerides  were discussed in detail.  2) BP/HTN:  Blood pressure is controlled to target.  He is advised to continue his current blood pressure medications including Losartan 25 mg p.o. daily and Lasix 20 mg po daily.    3) Lipids/HPL:  His most recent lipid panel from 08/06/21 shows controlled LDL of 52.  He is advised to continue Crestor 20 mg po daily at bedtime  and his Omega 3 Fatty acids.    Also advised him to avoid fried foods and butter.     4)  Weight/Diet: His Body mass index is 29.32 kg/m.-not a candidate for major weight loss.  CDE Consult has been initiated , exercise, and detailed carbohydrates information provided.  5) Chronic Care/Health Maintenance: -he is on ARB and Statin medications and is encouraged to initiate and continue to follow up with Ophthalmology, Dentist,  Podiatrist at least yearly or according to recommendations, and he is advised to stay away from smoking.  I have recommended yearly flu vaccine and pneumonia vaccine at least every 5 years; moderate intensity exercise for up to 150 minutes weekly; and sleep for at least 7 hours a day.  - I advised patient to maintain close follow up with [facility], [facility] for primary care needs.      I spent 30 minutes in the care of the patient today including review of labs from [place], Lipids, Thyroid Function, Hematology (current and previous including abstractions from other facilities); face-to-face time discussing  his blood glucose readings/logs, discussing hypoglycemia and hyperglycemia episodes and symptoms, medications doses, his options of short and long term treatment based on the latest standards of care / guidelines;  discussion about incorporating lifestyle medicine;  and documenting the encounter. Risk reduction counseling performed per USPSTF guidelines to reduce obesity and cardiovascular risk factors.     Please refer to Patient Instructions for Blood Glucose Monitoring and Insulin/Medications Dosing Guide"  in media tab for additional information. Please  also refer to " Patient Self Inventory" in the Media  tab for reviewed elements of pertinent patient history.  Jesse Rodgers participated in the discussions, expressed understanding, and voiced agreement with the above plans.  All questions were answered to his satisfaction. he is encouraged to contact  clinic should he have  any questions or concerns prior to his return visit.    Follow up plan: - Return in about 4 months (around 04/18/2022) for Diabetes F/U with A1c in office, No previsit labs, Bring meter and logs.  Rayetta Pigg, [facility] [address] [place], [place] [address] Phone: [phone] Fax: [phone]  12/16/2021, 4:25 PM

## 2021-12-22 ENCOUNTER — Encounter: Payer: Self-pay | Admitting: Internal Medicine

## 2021-12-22 ENCOUNTER — Ambulatory Visit (INDEPENDENT_AMBULATORY_CARE_PROVIDER_SITE_OTHER): Payer: BC Managed Care – PPO | Admitting: Internal Medicine

## 2021-12-22 VITALS — BP 118/76 | HR 85 | Temp 97.9°F | Ht 75.0 in | Wt 232.8 lb

## 2021-12-22 DIAGNOSIS — R0602 Shortness of breath: Secondary | ICD-10-CM

## 2021-12-22 DIAGNOSIS — Z23 Encounter for immunization: Secondary | ICD-10-CM | POA: Diagnosis not present

## 2021-12-22 DIAGNOSIS — F17200 Nicotine dependence, unspecified, uncomplicated: Secondary | ICD-10-CM | POA: Diagnosis not present

## 2021-12-22 MED ORDER — ALBUTEROL SULFATE HFA 108 (90 BASE) MCG/ACT IN AERS
2.0000 | INHALATION_SPRAY | Freq: Four times a day (QID) | RESPIRATORY_TRACT | 5 refills | Status: DC | PRN
Start: 2021-12-22 — End: 2023-01-11

## 2021-12-22 NOTE — Patient Instructions (Addendum)
Please schedule follow up scheduled with APP in 1 months.   Before your next visit I would like you to have:  Full set of PFTs - 1 hr  I am concerned your shortness of breath could be related to COPD. We will get some formal breathing testing to determine this. In the mean time start taking albuterol rescue inhaler to see if this helps your breathing.  We can give you your flu shot today.   Take the albuterol rescue inhaler every 4 to 6 hours as needed for wheezing or shortness of breath. You can also take it 15 minutes before exercise or exertional activity. Side effects include heart racing or pounding, jitters or anxiety. If you have a history of an irregular heart rhythm, it can make this worse. Can also give some patients a hard time sleeping.   Understanding COPD   What is COPD? COPD stands for chronic obstructive pulmonary (lung) disease. COPD is a general term used for several lung diseases.  COPD is an umbrella term and encompasses other  common diseases in this group like chronic bronchitis and emphysema. Chronic asthma may also be included in this group. While some patients with COPD have only chronic bronchitis or emphysema, most patients have a combination of both.  You might hear these terms used in exchange for one another.   COPD adds to the work of the heart. Diseased lungs may reduce the amount of oxygen that goes to the blood. High blood pressure in blood vessels from the heart to the lungs makes it difficult for the heart to pump. Lung disease can also cause the body to produce too many red blood cells which may make the blood thicker and harder to pump.   Patients who have COPD with low oxygen levels may develop an enlarged heart (cor pulmonale). This condition weakens the heart and causes increased shortness of breath and swelling in the legs and feet.   Chronic bronchitis Chronic bronchitis is irritation and inflammation (swelling) of the lining in the bronchial tubes  (air passages). The irritation causes coughing and an excess amount of mucus in the airways. The swelling makes it difficult to get air in and out of the lungs. The small, hair-like structures on the inside of the airways (called cilia) may be damaged by the irritation. The cilia are then unable to help clean mucus from the airways.  Bronchitis is generally considered to be chronic when you have: a productive cough (cough up mucus) and shortness of breath that lasts about 3 months or more each year for 2 or more years in a row. Your doctor may define chronic bronchitis differently.   Emphysema Emphysema is the destruction, or breakdown, of the walls of the alveoli (air sacs) located at the end of the bronchial tubes. The damaged alveoli are not able to exchange oxygen and carbon dioxide between the lungs and the blood. The bronchioles lose their elasticity and collapse when you exhale, trapping air in the lungs. The trapped air keeps fresh air and oxygen from entering the lungs.   Who is affected by COPD? Emphysema and chronic bronchitis affect approximately 16 million people in the [place], or close to 11 percent of the population.   Symptoms of COPD  Shortness of breath  Shortness of breath with mild exercise (walking, using the stairs, etc.)  Chronic, productive cough (with mucus)  A feeling of "tightness" in the chest  Wheezing   What causes COPD? The two primary causes of  COPD are cigarette smoking and alpha1-antitrypsin (AAT) deficiency. Air pollution and occupational dusts may also contribute to COPD, especially when the person exposed to these substances is a cigarette smoker.  Cigarette smoke causes COPD by irritating the airways and creating inflammation that narrows the airways, making it more difficult to breathe. Cigarette smoke also causes the cilia to stop working properly so mucus and trapped particles are not cleaned from the airways. As a result, chronic cough and excess  mucus production develop, leading to chronic bronchitis.  In some people, chronic bronchitis and infections can lead to destruction of the small airways, or emphysema.  AAT deficiency, an inherited disorder, can also lead to emphysema. Alpha antitrypsin (AAT) is a protective material produced in the liver and transported to the lungs to help combat inflammation. When there is not enough of the chemical AAT, the body is no longer protected from an enzyme in the white blood cells.   How is COPD diagnosed?  To diagnose COPD, the physician needs to know: Do you smoke?  Have you had chronic exposure to dust or air pollutants?  Do other members of your family have lung disease?  Are you short of breath?  Do you get short of breath with exercise?  Do you have chronic cough and/or wheezing?  Do you cough up excess mucus?  To help with the diagnosis, the physician will conduct a thorough physical exam which includes:  Listening to your lungs and heart  Checking your blood pressure and pulse  Examining your nose and throat  Checking your feet and ankles for swelling   Laboratory and other tests Several laboratory and other tests are needed to confirm a diagnosis of COPD. These tests may include:  Chest X-ray to look for lung changes that could be caused by COPD   Spirometry and pulmonary function tests (PFTs) to determine lung volume and air flow  Pulse oximetry to measure the saturation of oxygen in the blood  Arterial blood gases (ABGs) to determine the amount of oxygen and carbon dioxide in the blood  Exercise testing to determine if the oxygen level in the blood drops during exercise   Treatment In the beginning stages of COPD, there is minimal shortness of breath that may be noticed only during exercise. As the disease progresses, shortness of breath may worsen and you may need to wear an oxygen device.   To help control other symptoms of COPD, the following treatments and lifestyle changes  may be prescribed.  Quitting smoking  Avoiding cigarette smoke and other irritants  Taking medications including: a. bronchodilators b. anti-inflammatory agents c. oxygen d. antibiotics  Maintaining a healthy diet  Following a structured exercise program such as pulmonary rehabilitation Preventing respiratory infections  Controlling stress   If your COPD progresses, you may be eligible to be evaluated for lung volume reduction surgery or lung transplantation. You may also be eligible to participate in certain clinical trials (research studies). Ask your health care providers about studies being conducted in your hospital.   What is the outlook? Although COPD can not be cured, its symptoms can be treated and your quality of life can be improved. Your prognosis or outlook for the future will depend on how well your lungs are functioning, your symptoms, and how well you respond to and follow your treatment plan.

## 2021-12-22 NOTE — Progress Notes (Signed)
Jesse Rodgers    OH:[id number]    09/10/1975  Primary Care Physician:[facility], [facility]  Referring Physician: Carol Ada, [address] [place],  [place] [address] Reason for Consultation: shortness of breath Date of Consultation: 12/22/2021  Chief complaint:   Chief Complaint  Patient presents with   Consult    Smoker for 25 years. One pack a day. Now vaping x 2 years.  C/o SOB with exercise or resting.  Would like flu vaccine today.     HPI:  Jesse Rodgers is a 46 y.o. man who presents for new patient evaluation for shortness of breath.   Has a history of recently diagnosed psoriatic arthritis. He is on humira for this. Joints are mostly his hands which are involved.   Has been having dyspnea on exertion with fatigue, especially going up stairs.  Denies cough, chest tightness, wheezing.   Had bronchitis several years ago. No recurrent respiratory infections.   She had breathing testing done - ?in office spirometry at [facility] occupational health. Was told there was a decrease in his lung function. He did not bring this with him today.   Does not currently take any inhalers for his breathing.   Also history of DM2, alcohol use disorder (cutting back,) and OSA on CPAP.   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  Exposures: lives at home with wife, 2 kids. Wife smokes cigarettes heavily.  Smoking history: 30 pack years, quit cigarettes in 2020. Currently vapes. 1 cartridge lasts a week.   Social History   Occupational History   Not on file  Tobacco Use   Smoking status: Former    Packs/day: 1.00    Years: 30.00    Total pack years: 30.00    Types: Cigarettes    Quit date: 02/08/2019    Years since quitting: 2.8   Smokeless tobacco: Never   Tobacco comments:    Pt currently vapes, has nicotine.  Daily  Vaping Use   Vaping Use: Every day  Substance and  Sexual Activity   Alcohol use: Yes    Comment: occasional   Drug use: No   Sexual activity: Yes    Relevant family history:  Family History  Problem Relation Age of Onset   Coronary artery disease Mother    Breast cancer Mother    Alzheimer's disease Father    Diabetes Sister    Multiple sclerosis Sister    Asthma Son     Past Medical History:  Diagnosis Date   Arthritis    back    Depression    takes Zoloft daily   Diabetes mellitus without complication ([place])    takes Toujeo and Novolog nightly;average fasting blood sugar runs 150   History of kidney stones    Hyperlipidemia    takes Simvastatin daily   Hypertension    takes Losartan daily    Past Surgical History:  Procedure Laterality Date   ABDOMINAL SURGERY     APPENDECTOMY     BACK SURGERY     DENTAL SURGERY  12/10/2014   molar tooth #  14,15,16  bone biopsy  of ramus   PARTIAL COLECTOMY     TOOTH EXTRACTION N/A 12/10/2014   Procedure: EXTRACTION MOLARS #14,15,16  INTRAORAL AND EXTRAORAL INCISION AND DRAINAGE AND BONE BIOPSY OF LEFT RAMUS;  Surgeon: Tamela Oddi, DDS;  Location: [facility];  Service: Oral Surgery;  Laterality: N/A;     Physical Exam: Blood pressure 118/76, pulse 85, temperature 97.9 F (36.6 C), temperature source Oral, height 6\' 3"  (1.905 m), weight 232 lb 12.8 oz (105.6 kg), SpO2 97 %. Gen:      No acute distress ENT:  no nasal polyps, mucus membranes moist Lungs:    No increased respiratory effort, symmetric chest wall excursion, clear to auscultation bilaterally, no wheezes or crackles CV:         Regular rate and rhythm; no murmurs, rubs, or gallops.  No pedal edema Abd:      + bowel sounds; soft, non-tender; no distension MSK: no acute synovitis of DIP or PIP joints, no mechanics hands.  Skin:      Warm and dry; no rashes Neuro: normal speech, no focal facial asymmetry Psych: alert and oriented x3, normal mood and affect   Data Reviewed/Medical Decision Making:  Independent  interpretation of tests: Imaging:   PFTs:    Labs:   Immunization status:  Immunization History  Administered Date(s) Administered   Influenza,inj,Quad PF,6+ Mos 12/22/2021   Influenza-Unspecified 11/23/2014     I reviewed prior external note(s) from Cardiology  I reviewed the result(s) of the labs and imaging as noted above.   I have ordered PFT   Assessment:  Shortness of breath Tobacco use disorder 25 pack years Current Vaping Psoriatic Arthritis on humira  Plan/Recommendations: Will obtain PFTs Start albuterol inhaler  Suspect COPD.   We discussed disease management and progression at length today.    Return to Care: Return in about 4 weeks (around 01/19/2022).  Jesse Llamas, MD [facility]  CC: Carol Ada, MD

## 2022-01-23 ENCOUNTER — Ambulatory Visit: Payer: BC Managed Care – PPO | Admitting: Nurse Practitioner

## 2022-01-24 ENCOUNTER — Other Ambulatory Visit: Payer: Self-pay | Admitting: Nurse Practitioner

## 2022-01-25 ENCOUNTER — Other Ambulatory Visit: Payer: Self-pay | Admitting: "Endocrinology

## 2022-01-25 DIAGNOSIS — E1065 Type 1 diabetes mellitus with hyperglycemia: Secondary | ICD-10-CM

## 2022-01-26 ENCOUNTER — Other Ambulatory Visit: Payer: Self-pay | Admitting: Nurse Practitioner

## 2022-01-26 ENCOUNTER — Encounter: Payer: Self-pay | Admitting: Nurse Practitioner

## 2022-01-26 DIAGNOSIS — E1065 Type 1 diabetes mellitus with hyperglycemia: Secondary | ICD-10-CM

## 2022-01-26 MED ORDER — DEXCOM G6 TRANSMITTER MISC: 1 refills | Status: DC

## 2022-01-26 MED ORDER — DEXCOM G6 SENSOR MISC: 3 refills | Status: DC

## 2022-02-05 DIAGNOSIS — M2560 Stiffness of unspecified joint, not elsewhere classified: Secondary | ICD-10-CM | POA: Diagnosis not present

## 2022-02-05 DIAGNOSIS — L4050 Arthropathic psoriasis, unspecified: Secondary | ICD-10-CM | POA: Diagnosis not present

## 2022-02-05 DIAGNOSIS — M79642 Pain in left hand: Secondary | ICD-10-CM | POA: Diagnosis not present

## 2022-02-05 DIAGNOSIS — M79641 Pain in right hand: Secondary | ICD-10-CM | POA: Diagnosis not present

## 2022-02-13 ENCOUNTER — Encounter: Payer: Self-pay | Admitting: Nurse Practitioner

## 2022-02-13 ENCOUNTER — Ambulatory Visit (INDEPENDENT_AMBULATORY_CARE_PROVIDER_SITE_OTHER): Payer: BC Managed Care – PPO | Admitting: Internal Medicine

## 2022-02-13 ENCOUNTER — Ambulatory Visit: Payer: BC Managed Care – PPO | Admitting: Nurse Practitioner

## 2022-02-13 VITALS — BP 128/68 | HR 96 | Temp 99.0°F | Ht 75.0 in | Wt 236.0 lb

## 2022-02-13 DIAGNOSIS — Z720 Tobacco use: Secondary | ICD-10-CM

## 2022-02-13 DIAGNOSIS — R0602 Shortness of breath: Secondary | ICD-10-CM | POA: Diagnosis not present

## 2022-02-13 DIAGNOSIS — R0609 Other forms of dyspnea: Secondary | ICD-10-CM | POA: Diagnosis not present

## 2022-02-13 DIAGNOSIS — G4733 Obstructive sleep apnea (adult) (pediatric): Secondary | ICD-10-CM | POA: Diagnosis not present

## 2022-02-13 DIAGNOSIS — F17200 Nicotine dependence, unspecified, uncomplicated: Secondary | ICD-10-CM

## 2022-02-13 LAB — PULMONARY FUNCTION TEST
DL/VA % pred: 95 %
DL/VA: 4.17 ml/min/mmHg/L
DLCO cor % pred: 83 %
DLCO cor: 28.66 ml/min/mmHg
DLCO unc % pred: 83 %
DLCO unc: 28.66 ml/min/mmHg
FEF 25-75 Post: 3.86 L/sec
FEF 25-75 Pre: 3.95 L/sec
FEF2575-%Change-Post: -2 %
FEF2575-%Pred-Post: 95 %
FEF2575-%Pred-Pre: 97 %
FEV1-%Change-Post: 0 %
FEV1-%Pred-Post: 85 %
FEV1-%Pred-Pre: 84 %
FEV1-Post: 3.96 L
FEV1-Pre: 3.92 L
FEV1FVC-%Change-Post: 1 %
FEV1FVC-%Pred-Pre: 102 %
FEV6-%Change-Post: 0 %
FEV6-%Pred-Post: 83 %
FEV6-%Pred-Pre: 83 %
FEV6-Post: 4.84 L
FEV6-Pre: 4.84 L
FEV6FVC-%Pred-Post: 103 %
FEV6FVC-%Pred-Pre: 103 %
FVC-%Change-Post: 0 %
FVC-%Pred-Post: 80 %
FVC-%Pred-Pre: 81 %
FVC-Post: 4.84 L
FVC-Pre: 4.89 L
Post FEV1/FVC ratio: 82 %
Post FEV6/FVC ratio: 100 %
Pre FEV1/FVC ratio: 80 %
Pre FEV6/FVC Ratio: 100 %
RV % pred: 78 %
RV: 1.79 L
TLC % pred: 85 %
TLC: 6.79 L

## 2022-02-13 NOTE — Progress Notes (Signed)
_0  ID: Jesse Rodgers, male    DOB: [birth year], 46 y.o.   MRN: [id number]  Chief Complaint  Patient presents with   Follow-up    Referring provider: [facility]  HPI: 46 year old male, former smoker followed for shortness of breath. He is a patient of Dr. Mauricio Po and last seen in office 12/22/2021 for initial consult. Past medical history significant for HTN, DM, HLD, depression.   TEST/EVENTS:  02/13/2022 PFTs: FVC 81, FEV1 84, ratio 82, TLC 85, DLCO 83. No BD.   12/22/2021: OV with Dr. Shearon Stalls for initial consult for shortness of breath. Having DOE and fatigue, especially going up stairs. Had bronchitis several years ago. Will obtain PFTs. Start albuterol PRN. Suspect COPD.  02/13/2022: Today - follow up Patient presents today for follow-up after pulmonary function testing.  PFTs were overall normal.  He did have a reduced ERV.  DLCO was normal.  Overall, feels like his breathing is unchanged compared to when he was here last.  He feels like over the last 6 months or so he just does not have the stamina that he used to.  He denies any significant cough or chest congestion.  He does not have any dizziness, lower extremity swelling, orthopnea, PND.  He has tried using the albuterol when he is walking back from the end of his driveway.  Feels like this is when he gets most winded.  Does not feel like it makes a huge difference but it also makes his heart race some so he does not use it very often.  He would prefer not to go on any more medicines at this time if he does not have to.  He does tell me that he stopped wearing his CPAP probably about 6 months ago as well.  He had significantly reduced hemorrhage alcohol he was drinking so folic he did not need it anymore.  He has had a 35 pound weight gain over the last few years.  He is still snoring but his wife has not noticed him stopping breathing is much.  Denies any drowsy driving, morning headaches, sleep parasomnias or  paralysis.  No history of narcolepsy or cataplexy.  Allergies  Allergen Reactions   Bactrim [Sulfamethoxazole-Trimethoprim] Rash    Immunization History  Administered Date(s) Administered   Influenza,inj,Quad PF,6+ Mos 12/22/2021   Influenza-Unspecified 11/23/2014    Past Medical History:  Diagnosis Date   Arthritis    back    Depression    takes Zoloft daily   Diabetes mellitus without complication ([place])    takes Toujeo and Novolog nightly;average fasting blood sugar runs 150   History of kidney stones    Hyperlipidemia    takes Simvastatin daily   Hypertension    takes Losartan daily    Tobacco History: Social History   Tobacco Use  Smoking Status Former   Packs/day: 1.00   Years: 30.00   Total pack years: 30.00   Types: Cigarettes   Quit date: 02/08/2019   Years since quitting: 3.0  Smokeless Tobacco Never  Tobacco Comments   Pt no longer vapes as of December 1,2023 LW   Counseling given: Not Answered Tobacco comments: Pt no longer vapes as of December 1,2023 LW   Outpatient Medications Prior to Visit  Medication Sig Dispense Refill   albuterol (VENTOLIN HFA) 108 (90 Base) MCG/ACT inhaler Inhale 2 puffs into the lungs every 6 (six) hours as needed. 18 g 5   aspirin EC 81 MG tablet Take  1 tablet (81 mg total) by mouth daily with breakfast. Swallow whole. 30 tablet 12   Continuous Blood Gluc Sensor (DEXCOM G6 SENSOR) MISC APPLY NEW SENSOR EVERY 10 DAYS AS DIRECTED. 3 each 2   Continuous Blood Gluc Sensor (DEXCOM G6 SENSOR) MISC Apply new sensor every 10 days as directed. 9 each 3   Continuous Blood Gluc Transmit (DEXCOM G6 TRANSMITTER) MISC USE TO TEST BLOOD GLUCOSE 4-5 TIMES DAILY 1 each 1   furosemide (LASIX) 20 MG tablet Take 20 mg by mouth daily as needed.     glucagon (GLUCAGON EMERGENCY) 1 MG injection Inject 1 mg into the vein once as needed. 1 each 12   HUMIRA PEN 40 MG/0.4ML PNKT SMARTSIG:40 Milligram(s) SUB-Q Every 2 Weeks     insulin aspart  (NOVOLOG) 100 UNIT/ML injection Use with Omnipod for TDD around 100 units daily 80 mL 3   Insulin Disposable Pump (OMNIPOD 5 G6 INTRO, GEN 5,) KIT Change pod every 48-72 hrs 1 kit 0   Insulin Disposable Pump (OMNIPOD 5 G6 POD, GEN 5,) MISC Change pod every 48-72 hrs 6 each 6   metoprolol tartrate (LOPRESSOR) 25 MG tablet Take 1 tablet (25 mg total) by mouth 2 (two) times daily. 180 tablet 3   Multiple Vitamin (MULTIVITAMIN WITH MINERALS) TABS tablet Take 1 tablet by mouth daily.     NON FORMULARY Taking Total Beet product daily     rosuvastatin (CRESTOR) 20 MG tablet TAKE 1 TABLET(20 MG) BY MOUTH DAILY 90 tablet 0   sertraline (ZOLOFT) 100 MG tablet Take 1 tablet (100 mg total) by mouth daily. 30 tablet 0   diphenhydrAMINE (BENADRYL) 25 mg capsule Take 1 capsule (25 mg total) by mouth every 6 (six) hours as needed for itching. (Patient not taking: Reported on 12/22/2021) 30 capsule 0   glucose blood (FREESTYLE TEST STRIPS) test strip Use as instructed to monitor glucose 4 times daily, before breakfast and before bed. 100 each 12   No facility-administered medications prior to visit.     Review of Systems:   Constitutional: No weight loss or gain, night sweats, fevers, chills, or lassitude. +fatigue  HEENT: No headaches, difficulty swallowing, tooth/dental problems, or sore throat. No sneezing, itching, ear ache, nasal congestion, or post nasal drip CV:  No chest pain, orthopnea, PND, swelling in lower extremities, anasarca, dizziness, palpitations, syncope Resp: + shortness of breath with exertion. No excess mucus or change in color of mucus. No productive or non-productive. No hemoptysis. No wheezing.  No chest wall deformity GI:  No heartburn, indigestion GU: No dysuria, change in color of urine, urgency or frequency.   Skin: No rash, lesions, ulcerations MSK:  No joint pain or swelling.  No decreased range of motion.  No back pain. Neuro: No dizziness or lightheadedness.  Psych: No  depression or anxiety. Mood stable.     Physical Exam:  BP 128/68 (BP Location: Right Arm, Patient Position: Sitting, Cuff Size: Large)   Pulse 96   Temp 99 F (37.2 C) (Oral)   Ht _0  (1.905 m)   Wt 236 lb (107 kg)   SpO2 98%   BMI 29.50 kg/m   GEN: Pleasant, interactive, well-appearing; in no acute distress. HEENT:  Normocephalic and atraumatic. PERRLA. Sclera white. Nasal turbinates pink, moist and patent bilaterally. No rhinorrhea present. Oropharynx pink and moist, without exudate or edema. No lesions, ulcerations, or postnasal drip. Mallampati III NECK:  Supple w/ fair ROM. No JVD present. Normal carotid impulses w/o bruits. Thyroid symmetrical  with no goiter or nodules palpated. No lymphadenopathy.   CV: RRR, no m/r/g, no peripheral edema. Pulses intact, +2 bilaterally. No cyanosis, pallor or clubbing. PULMONARY:  Unlabored, regular breathing. Clear bilaterally A&P w/o wheezes/rales/rhonchi. No accessory muscle use.  GI: BS present and normoactive. Protuberant abdomen. Soft, non-tender to palpation. No organomegaly or masses detected.  MSK: No erythema, warmth or tenderness. Cap refil <2 sec all extrem. No deformities or joint swelling noted.  Neuro: A/Ox3. No focal deficits noted.   Skin: Warm, no lesions or rashe Psych: Normal affect and behavior. Judgement and thought content appropriate.     Lab Results:  CBC    Component Value Date/Time   WBC 7.8 08/07/2021 0448   RBC 3.55 (L) 08/07/2021 0448   HGB 10.5 (L) 08/07/2021 0448   HCT 31.5 (L) 08/07/2021 0448   PLT 273 08/07/2021 0448   MCV 88.7 08/07/2021 0448   MCH 29.6 08/07/2021 0448   MCHC 33.3 08/07/2021 0448   RDW 11.9 08/07/2021 0448   LYMPHSABS 1.1 08/05/2021 1205   MONOABS 0.6 08/05/2021 1205   EOSABS 0.1 08/05/2021 1205   BASOSABS 0.0 08/05/2021 1205    BMET    Component Value Date/Time   NA 136 08/07/2021 0448   NA 142 02/07/2021 1457   K 4.5 08/07/2021 0448   CL 104 08/07/2021 0448   CO2 24  08/07/2021 0448   GLUCOSE 223 (H) 08/07/2021 0448   BUN 20 08/07/2021 0448   BUN 22 02/07/2021 1457   CREATININE 0.99 08/07/2021 0448   CREATININE 0.90 02/06/2020 0934   CALCIUM 8.4 (L) 08/07/2021 0448   GFRNONAA >60 08/07/2021 0448   GFRNONAA 102 02/06/2020 0934   GFRAA 118 02/06/2020 0934    BNP No results found for: "BNP"   Imaging:  No results found.       Latest Ref Rng & Units 02/13/2022   11:04 AM  PFT Results  FVC-Pre L 4.89  P  FVC-Predicted Pre % 81  P  FVC-Post L 4.84  P  FVC-Predicted Post % 80  P  Pre FEV1/FVC % % 80  P  Post FEV1/FCV % % 82  P  FEV1-Pre L 3.92  P  FEV1-Predicted Pre % 84  P  FEV1-Post L 3.96  P  DLCO uncorrected ml/min/mmHg 28.66  P  DLCO UNC% % 83  P  DLCO corrected ml/min/mmHg 28.66  P  DLCO COR %Predicted % 83  P  DLVA Predicted % 95  P  TLC L 6.79  P  TLC % Predicted % 85  P  RV % Predicted % 78  P    P Preliminary result    No results found for: "NITRICOXIDE"      Assessment & Plan:   DOE (dyspnea on exertion) DOE and fatigue over the past 6 months. This is around the same time that he stopped wearing his CPAP. Possible symptoms are related to untreated OSA. Also suspect that a component is related to his overweight status and weight gain of 35 lb in the past 1-2 years given his reduced ERV on PFTs today. No significant change with albuterol. Advised healthy weight loss measures. We will order repeat HST as well.  Patient Instructions  Continue Albuterol inhaler 2 puffs every 6 hours as needed for shortness of breath or wheezing.   Given your symptoms and history, I am concerned that you still have underlying sleep disordered breathing with obstructive sleep apnea. I have ordered a home sleep study for further evaluation.  Someone will contact you for scheduling.  Follow up after home sleep study to discuss results with Dr. Shearon Stalls or Alanson Aly. If symptoms do not improve or worsen, please contact office for sooner  follow up or seek emergency care.    OSA (obstructive sleep apnea) He has a history of OSA; previously on CPAP. He cut back on his alcohol consumption and has been sleeping better at night. He has had increased weight gain of 35 lb.  He has unexplained DOE, excessive daytime sleepiness and morning dry mouth. BMI 29. Epworth 13. Given this,  I am concerned he still has sleep disordered breathing with obstructive sleep apnea. He will need sleep study for further evaluation.    - discussed how weight can impact sleep and risk for sleep disordered breathing - discussed options to assist with weight loss: combination of diet modification, cardiovascular and strength training exercises   - had an extensive discussion regarding the adverse health consequences related to untreated sleep disordered breathing - specifically discussed the risks for hypertension, coronary artery disease, cardiac dysrhythmias, cerebrovascular disease, and diabetes - lifestyle modification discussed   - discussed how sleep disruption can increase risk of accidents, particularly when driving - safe driving practices were discussed   Tobacco abuse Former tobacco use. 30 pack year history. No evidence of obstruction on PFTs.   I spent 35 minutes of dedicated to the care of this patient on the date of this encounter to include pre-visit review of records, face-to-face time with the patient discussing conditions above, post visit ordering of testing, clinical documentation with the electronic health record, making appropriate referrals as documented, and communicating necessary findings to members of the patients care team.  Clayton Bibles, NP 02/13/2022  Pt aware and understands NP's role.

## 2022-02-13 NOTE — Patient Instructions (Addendum)
Continue Albuterol inhaler 2 puffs every 6 hours as needed for shortness of breath or wheezing.   Given your symptoms and history, I am concerned that you still have underlying sleep disordered breathing with obstructive sleep apnea. I have ordered a home sleep study for further evaluation. Someone will contact you for scheduling.  Follow up after home sleep study to discuss results with Dr. Celine Mans or Philis Nettle. If symptoms do not improve or worsen, please contact office for sooner follow up or seek emergency care.

## 2022-02-13 NOTE — Assessment & Plan Note (Signed)
DOE and fatigue over the past 6 months. This is around the same time that he stopped wearing his CPAP. Possible symptoms are related to untreated OSA. Also suspect that a component is related to his overweight status and weight gain of 35 lb in the past 1-2 years given his reduced ERV on PFTs today. No significant change with albuterol. Advised healthy weight loss measures. We will order repeat HST as well.  Patient Instructions  Continue Albuterol inhaler 2 puffs every 6 hours as needed for shortness of breath or wheezing.   Given your symptoms and history, I am concerned that you still have underlying sleep disordered breathing with obstructive sleep apnea. I have ordered a home sleep study for further evaluation. Someone will contact you for scheduling.  Follow up after home sleep study to discuss results with Dr. Celine Mans or Philis Nettle. If symptoms do not improve or worsen, please contact office for sooner follow up or seek emergency care.

## 2022-02-13 NOTE — Assessment & Plan Note (Signed)
He has a history of OSA; previously on CPAP. He cut back on his alcohol consumption and has been sleeping better at night. He has had increased weight gain of 35 lb.  He has unexplained DOE, excessive daytime sleepiness and morning dry mouth. BMI 29. Epworth 13. Given this,  I am concerned he still has sleep disordered breathing with obstructive sleep apnea. He will need sleep study for further evaluation.    - discussed how weight can impact sleep and risk for sleep disordered breathing - discussed options to assist with weight loss: combination of diet modification, cardiovascular and strength training exercises   - had an extensive discussion regarding the adverse health consequences related to untreated sleep disordered breathing - specifically discussed the risks for hypertension, coronary artery disease, cardiac dysrhythmias, cerebrovascular disease, and diabetes - lifestyle modification discussed   - discussed how sleep disruption can increase risk of accidents, particularly when driving - safe driving practices were discussed

## 2022-02-13 NOTE — Progress Notes (Signed)
Full PFT completed today ? ?

## 2022-02-13 NOTE — Assessment & Plan Note (Signed)
Former tobacco use. 30 pack year history. No evidence of obstruction on PFTs.

## 2022-02-17 ENCOUNTER — Ambulatory Visit: Payer: BC Managed Care – PPO | Admitting: Pulmonary Disease

## 2022-02-17 DIAGNOSIS — G4733 Obstructive sleep apnea (adult) (pediatric): Secondary | ICD-10-CM | POA: Diagnosis not present

## 2022-03-04 DIAGNOSIS — G4733 Obstructive sleep apnea (adult) (pediatric): Secondary | ICD-10-CM | POA: Diagnosis not present

## 2022-03-05 ENCOUNTER — Ambulatory Visit
Admission: EM | Admit: 2022-03-05 | Discharge: 2022-03-05 | Disposition: A | Discharge status: Home / Self Care | Payer: BC Managed Care – PPO | Attending: Nurse Practitioner | Admitting: Nurse Practitioner

## 2022-03-05 DIAGNOSIS — M545 Low back pain, unspecified: Secondary | ICD-10-CM | POA: Diagnosis not present

## 2022-03-05 MED ORDER — KETOROLAC TROMETHAMINE 30 MG/ML IJ SOLN
30.0000 mg | Freq: Once | INTRAMUSCULAR | Status: AC
  Administered 2022-03-05: 30 mg via INTRAMUSCULAR

## 2022-03-05 MED ORDER — TIZANIDINE HCL 4 MG PO TABS: 4.0000 mg | ORAL_TABLET | Freq: Every evening | ORAL | 0 refills | Status: DC | PRN

## 2022-03-05 NOTE — Discharge Instructions (Addendum)
We have given you a shot of Toradol today to help with the pain in your back.  Continue Tylenol [phone] mg every 6 hours as needed for pain along with the tizanidine at night time for muscular pain.  Follow up with your PCP and Rheumatologist as planned and if the pain persists or worsens despite treatment.

## 2022-03-05 NOTE — ED Triage Notes (Signed)
Pt reports his arthritis is flaring up in his back x 3 days.  He is having some back stiffness cannot perform ROM.   Pt states he would like a cortisone shot if he can to ease the pain. States he has an appointment with his rheumatologist on 04/05/22. Pt is on humara but its every other Thursday.

## 2022-03-05 NOTE — ED Provider Notes (Signed)
[facility]    CSN: [id number] Arrival date & time: 03/05/22  1455      History   Chief Complaint Chief Complaint  Patient presents with   Back Pain    I take Humira for Psoriatic Arthritis. Talked with Dr Carlene Coria, she said to see urgent care til my next visit. Back is in severe pain and very tight. - Entered by patient    HPI Jesse Rodgers is a 47 y.o. male.   Patient presents today for a few days of worsening back pain.  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  No recent trauma, fall, accident, or injury to his back that he knows of.  Reports the pain is moderate to severe, pitting does not radiate down leg.  Denies numbness or tingling shooting down leg, saddle anesthesia, new bowel or bladder incontinence, fever, nausea/vomiting, dysuria/urinary frequency, hematuria, falls, lower extremity weakness or decrease sensation since the pain began.  Has taken Tylenol for the pain which helps temporarily.  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  He has a rheumatologist and has follow-up with them scheduled for next month.    Past Medical History:  Diagnosis Date   Arthritis    back    Depression    takes Zoloft daily   Diabetes mellitus without complication ([place])    takes Toujeo and Novolog nightly;average fasting blood sugar runs 150   History of kidney stones    Hyperlipidemia    takes Simvastatin daily   Hypertension    takes Losartan daily    Patient Active Problem List   Diagnosis Date Noted   DOE (dyspnea on exertion) 02/13/2022   OSA (obstructive sleep apnea) 02/13/2022   Chest pain 08/05/2021   Elevated troponin 08/05/2021   Diabetes mellitus type 1 ([place]) 08/05/2021   Rash 08/05/2021   Elevated LFTs 08/05/2021   Uncontrolled type 1 diabetes mellitus with hyperglycemia ([place]) 09/28/2017   Tobacco abuse 09/28/2017   Myositis  10/24/2014   Odontogenic infection of jaw 10/23/2014   Dental infection 10/23/2014   Essential hypertension, benign 10/23/2014   Mixed hyperlipidemia 10/23/2014   Depression 10/23/2014   Dehydration 10/23/2014   DIABETES 05/15/2008   ANKLE PAIN, RIGHT 05/15/2008   ANKLE SPRAIN 05/15/2008    Past Surgical History:  Procedure Laterality Date   ABDOMINAL SURGERY     APPENDECTOMY     BACK SURGERY     DENTAL SURGERY  12/10/2014   molar tooth #  14,15,16  bone biopsy  of ramus   PARTIAL COLECTOMY     TOOTH EXTRACTION N/A 12/10/2014   Procedure: EXTRACTION MOLARS #14,15,16  INTRAORAL AND EXTRAORAL INCISION AND DRAINAGE AND BONE BIOPSY OF LEFT RAMUS;  Surgeon: Tamela Oddi, DDS;  Location: [facility];  Service: Oral Surgery;  Laterality: N/A;       Home Medications    Prior to Admission medications   Medication Sig Start Date End Date Taking? Authorizing Provider  tiZANidine (ZANAFLEX) 4 MG tablet Take 1 tablet (4 mg total) by mouth at bedtime as needed for muscle spasms. Do not take with alcohol or while driving or operating heavy machinery.  May cause drowsiness. 03/05/22  Yes Eulogio Bear, NP  albuterol (VENTOLIN HFA) 108 (90 Base) MCG/ACT inhaler Inhale 2 puffs into the lungs every 6 (six) hours as needed. 12/22/21   Spero Geralds,  MD  aspirin EC 81 MG tablet Take 1 tablet (81 mg total) by mouth daily with breakfast. Swallow whole. 08/08/21   Kathie Dike, MD  Continuous Blood Gluc Sensor (DEXCOM G6 SENSOR) MISC APPLY NEW SENSOR EVERY 10 DAYS AS DIRECTED. 01/28/22   Brita Romp, NP  Continuous Blood Gluc Sensor (DEXCOM G6 SENSOR) MISC Apply new sensor every 10 days as directed. 01/26/22   Brita Romp, NP  Continuous Blood Gluc Transmit (DEXCOM G6 TRANSMITTER) MISC USE TO TEST BLOOD GLUCOSE 4-5 TIMES DAILY 01/26/22   Brita Romp, NP  furosemide (LASIX) 20 MG tablet Take 20 mg by mouth daily as needed. 06/09/19   [provider]  glucagon (GLUCAGON  EMERGENCY) 1 MG injection Inject 1 mg into the vein once as needed. 09/29/18   Cassandria Anger, MD  HUMIRA PEN 40 MG/0.4ML PNKT SMARTSIG:40 Milligram(s) SUB-Q Every 2 Weeks 12/10/21   [provider]  insulin aspart (NOVOLOG) 100 UNIT/ML injection Use with Omnipod for TDD around 100 units daily 09/11/21   Brita Romp, NP  Insulin Disposable Pump (OMNIPOD 5 G6 INTRO, GEN 5,) KIT Change pod every 48-72 hrs 06/12/21   Brita Romp, NP  Insulin Disposable Pump (OMNIPOD 5 G6 POD, GEN 5,) MISC Change pod every 48-72 hrs 09/11/21   Brita Romp, NP  metoprolol tartrate (LOPRESSOR) 25 MG tablet Take 1 tablet (25 mg total) by mouth 2 (two) times daily. 09/03/21   Josue Hector, MD  Multiple Vitamin (MULTIVITAMIN WITH MINERALS) TABS tablet Take 1 tablet by mouth daily.    [provider]  NON FORMULARY Taking Total Beet product daily    [provider]  rosuvastatin (CRESTOR) 20 MG tablet TAKE 1 TABLET(20 MG) BY MOUTH DAILY 01/28/22   Brita Romp, NP  sertraline (ZOLOFT) 100 MG tablet Take 1 tablet (100 mg total) by mouth daily. 08/07/21   Kathie Dike, MD    Family History Family History  Problem Relation Age of Onset   Coronary artery disease Mother    Breast cancer Mother    Alzheimer's disease Father    Diabetes Sister    Multiple sclerosis Sister    Asthma Son     Social History Social History   Tobacco Use   Smoking status: Former    Packs/day: 1.00    Years: 30.00    Total pack years: 30.00    Types: Cigarettes    Quit date: 02/08/2019    Years since quitting: 3.0   Smokeless tobacco: Never   Tobacco comments:    Pt no longer vapes as of December 1,2023 LW  Vaping Use   Vaping Use: Every day  Substance Use Topics   Alcohol use: Yes    Comment: occasional   Drug use: No     Allergies   Bactrim [sulfamethoxazole-trimethoprim]   Review of Systems Review of Systems Per HPI  Physical Exam Triage Vital Signs ED  Triage Vitals  Enc Vitals Group     BP 03/05/22 1614 132/79     Pulse Rate 03/05/22 1614 84     Resp 03/05/22 1614 20     Temp 03/05/22 1614 98.5 F (36.9 C)     Temp Source 03/05/22 1614 Oral     SpO2 03/05/22 1614 97 %     Weight --      Height --      Head Circumference --      Peak Flow --      Pain  Score 03/05/22 1623 8     Pain Loc --      Pain Edu? --      Excl. in [place]? --    No data found.  Updated Vital Signs BP 132/79 (BP Location: Right Arm)   Pulse 84   Temp 98.5 F (36.9 C) (Oral)   Resp 20   SpO2 97%   Visual Acuity Right Eye Distance:   Left Eye Distance:   Bilateral Distance:    Right Eye Near:   Left Eye Near:    Bilateral Near:     Physical Exam Constitutional:      General: He is not in acute distress.    Appearance: Normal appearance. He is not toxic-appearing.  HENT:     Mouth/Throat:     Mouth: Mucous membranes are moist.     Pharynx: Oropharynx is clear.  Pulmonary:     Effort: Pulmonary effort is normal. No respiratory distress.  Musculoskeletal:     Lumbar back: No swelling, edema, deformity, spasms or tenderness. Normal range of motion.       Back:     Right lower leg: No edema.     Left lower leg: No edema.  Skin:    General: Skin is warm and dry.     Capillary Refill: Capillary refill takes less than 2 seconds.     Coloration: Skin is not jaundiced or pale.     Findings: No erythema.  Neurological:     Mental Status: He is alert and oriented to person, place, and time.  Psychiatric:        Behavior: Behavior is cooperative.      UC Treatments / Results  Labs (all labs ordered are listed, but only abnormal results are displayed) Labs Reviewed - No data to display  EKG   Radiology No results found.  Procedures Procedures (including critical care time)  Medications Ordered in UC Medications  ketorolac (TORADOL) 30 MG/ML injection 30 mg (30 mg Intramuscular Given 03/05/22 1651)    Initial Impression / Assessment  and Plan / UC Course  I have reviewed the triage vital signs and the nursing notes.  Pertinent labs & imaging results that were available during my care of the patient were reviewed by me and considered in my medical decision making (see chart for details).   Patient is well-appearing, normotensive, afebrile, not tachycardic, not tachypneic, oxygenating well on room air.    Acute right-sided low back pain without sciatica Suspect flare of psoriatic arthritis Treat with Toradol 30 mg IM in urgent care today Start muscle relaxant as needed Want to avoid corticosteroids secondary to uncontrolled T1DM Other supportive measures discussed  The patient was given the opportunity to ask questions.  All questions answered to their satisfaction.  The patient is in agreement to this plan.    Final Clinical Impressions(s) / UC Diagnoses   Final diagnoses:  Acute right-sided low back pain without sciatica     Discharge Instructions      We have given you a shot of Toradol today to help with the pain in your back.  Continue Tylenol [phone] mg every 6 hours as needed for pain along with the tizanidine at night time for muscular pain.  Follow up with your PCP and Rheumatologist as planned and if the pain persists or worsens despite treatment.    ED Prescriptions     Medication Sig Dispense Auth. Provider   tiZANidine (ZANAFLEX) 4 MG tablet Take 1 tablet (4 mg total)  by mouth at bedtime as needed for muscle spasms. Do not take with alcohol or while driving or operating heavy machinery.  May cause drowsiness. 30 tablet Eulogio Bear, NP      PDMP not reviewed this encounter.   Eulogio Bear, NP 03/05/22 [phone]

## 2022-03-09 NOTE — Progress Notes (Signed)
Please notify patient HST showed severe OSA with AHI 41.1/h. Please schedule f/u to discuss treatment options. Thanks.

## 2022-04-08 DIAGNOSIS — M545 Low back pain, unspecified: Secondary | ICD-10-CM | POA: Diagnosis not present

## 2022-04-08 DIAGNOSIS — M79642 Pain in left hand: Secondary | ICD-10-CM | POA: Diagnosis not present

## 2022-04-08 DIAGNOSIS — M2560 Stiffness of unspecified joint, not elsewhere classified: Secondary | ICD-10-CM | POA: Diagnosis not present

## 2022-04-08 DIAGNOSIS — L4050 Arthropathic psoriasis, unspecified: Secondary | ICD-10-CM | POA: Diagnosis not present

## 2022-04-20 ENCOUNTER — Encounter: Payer: Self-pay | Admitting: Nurse Practitioner

## 2022-04-20 ENCOUNTER — Ambulatory Visit (INDEPENDENT_AMBULATORY_CARE_PROVIDER_SITE_OTHER): Payer: BC Managed Care – PPO | Admitting: Nurse Practitioner

## 2022-04-20 VITALS — BP 138/80 | HR 84 | Ht 75.0 in | Wt 240.8 lb

## 2022-04-20 DIAGNOSIS — E782 Mixed hyperlipidemia: Secondary | ICD-10-CM | POA: Diagnosis not present

## 2022-04-20 DIAGNOSIS — E1065 Type 1 diabetes mellitus with hyperglycemia: Secondary | ICD-10-CM

## 2022-04-20 DIAGNOSIS — I10 Essential (primary) hypertension: Secondary | ICD-10-CM

## 2022-04-20 MED ORDER — DEXCOM G6 SENSOR MISC: 2 refills | Status: AC

## 2022-04-20 MED ORDER — OMNIPOD 5 DEXG7G6 PODS GEN 5 MISC: 6 refills | Status: DC

## 2022-04-20 MED ORDER — DEXCOM G6 TRANSMITTER MISC: 1 refills | Status: DC

## 2022-04-20 MED ORDER — INSULIN ASPART 100 UNIT/ML IJ SOLN: INTRAMUSCULAR | 3 refills | Status: DC

## 2022-04-20 NOTE — Progress Notes (Unsigned)
04/21/2022, 7:26 AM             Endocrinology follow-up note   Subjective:    Patient ID: Jesse Rodgers, male    DOB: 26-Nov-1975.  Jesse Rodgers is being seen in follow-up for management of currently uncontrolled symptomatic diabetes requested by  Sharilyn Sites, MD.   Past Medical History:  Diagnosis Date   Arthritis    back    Depression    takes Zoloft daily   Diabetes mellitus without complication ([facility])    takes Toujeo and Novolog nightly;average fasting blood sugar runs 150   History of kidney stones    Hyperlipidemia    takes Simvastatin daily   Hypertension    takes Losartan daily   Past Surgical History:  Procedure Laterality Date   ABDOMINAL SURGERY     APPENDECTOMY     BACK SURGERY     DENTAL SURGERY  12/10/2014   molar tooth #  14,15,16  bone biopsy  of ramus   PARTIAL COLECTOMY     TOOTH EXTRACTION N/A 12/10/2014   Procedure: EXTRACTION MOLARS #14,15,16  INTRAORAL AND EXTRAORAL INCISION AND DRAINAGE AND BONE BIOPSY OF LEFT RAMUS;  Surgeon: Tamela Oddi, DDS;  Location: [facility];  Service: Oral Surgery;  Laterality: N/A;   Social History   Socioeconomic History   Marital status: Married    Spouse name: Not on file   Number of children: Not on file   Years of education: Not on file   Highest education level: Not on file  Occupational History   Not on file  Tobacco Use   Smoking status: Former    Packs/day: 1.00    Years: 30.00    Total pack years: 30.00    Types: Cigarettes    Quit date: 02/08/2019    Years since quitting: 3.2   Smokeless tobacco: Never   Tobacco comments:    Pt no longer vapes as of December 1,2023 LW  Vaping Use   Vaping Use: Every day  Substance and Sexual Activity   Alcohol use: Yes    Comment: occasional   Drug use: No   Sexual activity: Yes  Other Topics Concern   Not on file  Social History Narrative   Not on file    Social Determinants of Health   Financial Resource Strain: Not on file  Food Insecurity: Not on file  Transportation Needs: Not on file  Physical Activity: Not on file  Stress: Not on file  Social Connections: Not on file   Outpatient Encounter Medications as of 04/20/2022  Medication Sig   albuterol (VENTOLIN HFA) 108 (90 Base) MCG/ACT inhaler Inhale 2 puffs into the lungs every 6 (six) hours as needed.   aspirin EC 81 MG tablet Take 1 tablet (81 mg total) by mouth daily with breakfast. Swallow whole.   Continuous Blood Gluc Sensor (DEXCOM G6 SENSOR) MISC Apply new sensor every 10 days as directed.   furosemide (LASIX) 20 MG tablet Take 20 mg by mouth daily as needed.   glucagon (GLUCAGON EMERGENCY) 1 MG injection Inject 1 mg into the vein once  as needed.   HUMIRA PEN 40 MG/0.4ML PNKT SMARTSIG:40 Milligram(s) SUB-Q Every 2 Weeks   Insulin Disposable Pump (OMNIPOD 5 G6 INTRO, GEN 5,) KIT Change pod every 48-72 hrs   metoprolol tartrate (LOPRESSOR) 25 MG tablet Take 1 tablet (25 mg total) by mouth 2 (two) times daily.   Multiple Vitamin (MULTIVITAMIN WITH MINERALS) TABS tablet Take 1 tablet by mouth daily.   NON FORMULARY Taking Total Beet product daily   rosuvastatin (CRESTOR) 20 MG tablet TAKE 1 TABLET(20 MG) BY MOUTH DAILY   sertraline (ZOLOFT) 100 MG tablet Take 1 tablet (100 mg total) by mouth daily.   tiZANidine (ZANAFLEX) 4 MG tablet Take 1 tablet (4 mg total) by mouth at bedtime as needed for muscle spasms. Do not take with alcohol or while driving or operating heavy machinery.  May cause drowsiness.   [DISCONTINUED] Continuous Blood Gluc Sensor (DEXCOM G6 SENSOR) MISC APPLY NEW SENSOR EVERY 10 DAYS AS DIRECTED.   [DISCONTINUED] Continuous Blood Gluc Transmit (DEXCOM G6 TRANSMITTER) MISC USE TO TEST BLOOD GLUCOSE 4-5 TIMES DAILY   [DISCONTINUED] insulin aspart (NOVOLOG) 100 UNIT/ML injection Use with Omnipod for TDD around 100 units daily   [DISCONTINUED] Insulin Disposable Pump  (OMNIPOD 5 G6 POD, GEN 5,) MISC Change pod every 48-72 hrs   Continuous Blood Gluc Sensor (DEXCOM G6 SENSOR) MISC Check glucose continuously   Continuous Blood Gluc Transmit (DEXCOM G6 TRANSMITTER) MISC USE TO TEST BLOOD GLUCOSE 4-5 TIMES DAILY   insulin aspart (NOVOLOG) 100 UNIT/ML injection Use with Omnipod for TDD around 100 units daily   Insulin Disposable Pump (OMNIPOD 5 G6 PODS, GEN 5,) MISC Change pod every 48-72 hrs   No facility-administered encounter medications on file as of 04/20/2022.    ALLERGIES: Allergies  Allergen Reactions   Bactrim [Sulfamethoxazole-Trimethoprim] Rash    VACCINATION STATUS: Immunization History  Administered Date(s) Administered   Influenza,inj,Quad PF,6+ Mos 12/22/2021   Influenza-Unspecified 11/23/2014    Diabetes He presents for his follow-up diabetic visit. He has type 1 diabetes mellitus. Onset time: He was diagnosed at approximate age of 47 years. His disease course has been improving. There are no hypoglycemic associated symptoms. Pertinent negatives for hypoglycemia include no confusion, headaches, pallor or seizures. Pertinent negatives for diabetes include no chest pain, no fatigue, no polydipsia, no polyphagia, no polyuria and no weakness. There are no hypoglycemic complications. Symptoms are stable. There are no diabetic complications. Risk factors for coronary artery disease include dyslipidemia, diabetes mellitus, hypertension, family history, male sex, tobacco exposure and sedentary lifestyle. Current diabetic treatment includes insulin pump. He is compliant with treatment most of the time. His weight is fluctuating minimally. He is following a generally unhealthy diet. When asked about meal planning, he reported none. He has not had a previous visit with a dietitian. He never participates in exercise. His home blood glucose trend is fluctuating minimally. His overall blood glucose range is 140-180 mg/dl. (He presents today with his CGM and  Omnipod 5 combo showing improved glycemic profile with slightly above target postprandial readings.  His POCT A1c today is 6.6%, improving from last visit of 7%.  Analysis of his CGM shows TIR 64%, TAR 36%, TBR 0% with a GMI of 7.4%.  He notes when he changes his pod, for several hours after, his glucose is higher than usual but eventually comes down.  ) An ACE inhibitor/angiotensin II receptor blocker is being taken. He sees a podiatrist.Eye exam is current.  Hyperlipidemia This is a chronic problem. The current episode started  more than 1 year ago. The problem is uncontrolled. Recent lipid tests were reviewed and are variable. Exacerbating diseases include diabetes and obesity. Factors aggravating his hyperlipidemia include fatty foods. Pertinent negatives include no chest pain, myalgias or shortness of breath. Current antihyperlipidemic treatment includes statins. The current treatment provides mild improvement of lipids. Compliance problems include adherence to diet and adherence to exercise.  Risk factors for coronary artery disease include diabetes mellitus, dyslipidemia, hypertension, male sex, a sedentary lifestyle and obesity.  Hypertension This is a chronic problem. The current episode started more than 1 year ago. The problem has been gradually improving since onset. The problem is controlled. Pertinent negatives include no chest pain, headaches, neck pain, palpitations or shortness of breath. There are no associated agents to hypertension. Risk factors for coronary artery disease include diabetes mellitus, dyslipidemia, male gender, smoking/tobacco exposure, sedentary lifestyle and obesity. Past treatments include ACE inhibitors and diuretics. The current treatment provides mild improvement. Compliance problems include exercise and diet.     Review of systems  Constitutional: + steadily increasing body weight,  current Body mass index is 30.1 kg/m. , + fatigue, no subjective hyperthermia, no  subjective hypothermia Eyes: no blurry vision, no xerophthalmia ENT: no sore throat, no nodules palpated in throat, no dysphagia/odynophagia, no hoarseness Cardiovascular: no chest pain, no shortness of breath, no palpitations, no leg swelling Respiratory: no cough, no shortness of breath Gastrointestinal: no nausea/vomiting/diarrhea Musculoskeletal: no muscle/joint aches, generalized joint swelling Skin: no rashes, no hyperemia Neurological: no tremors, no numbness, no tingling, no dizziness Psychiatric: no depression, no anxiety   Objective:    BP 138/80 (BP Location: Left Arm, Patient Position: Sitting, Cuff Size: Normal) Comment: Retake BP Manuel Cuff  Pulse 84   Ht 6' 3"$  (1.905 m)   Wt 240 lb 12.8 oz (109.2 kg)   BMI 30.10 kg/m   Wt Readings from Last 3 Encounters:  04/20/22 240 lb 12.8 oz (109.2 kg)  02/13/22 236 lb (107 kg)  12/22/21 232 lb 12.8 oz (105.6 kg)    BP Readings from Last 3 Encounters:  04/20/22 138/80  03/05/22 132/79  02/13/22 128/68     Physical Exam- Limited  Constitutional:  Body mass index is 30.1 kg/m. , not in acute distress, normal state of mind Eyes:  EOMI, no exophthalmos Musculoskeletal: no gross deformities, strength intact in all four extremities, no gross restriction of joint movements Skin:  no rashes, no hyperemia Neurological: no tremor with outstretched hands    Recent Results (from the past 2160 hour(s))  Pulmonary function test     Status: None   Collection Time: 02/13/22 11:04 AM  Result Value Ref Range   FVC-Pre 4.89 L   FVC-%Pred-Pre 81 %   FVC-Post 4.84 L   FVC-%Pred-Post 80 %   FVC-%Change-Post 0 %   FEV1-Pre 3.92 L   FEV1-%Pred-Pre 84 %   FEV1-Post 3.96 L   FEV1-%Pred-Post 85 %   FEV1-%Change-Post 0 %   FEV6-Pre 4.84 L   FEV6-%Pred-Pre 83 %   FEV6-Post 4.84 L   FEV6-%Pred-Post 83 %   FEV6-%Change-Post 0 %   Pre FEV1/FVC ratio 80 %   FEV1FVC-%Pred-Pre 102 %   Post FEV1/FVC ratio 82 %   FEV1FVC-%Change-Post 1  %   Pre FEV6/FVC Ratio 100 %   FEV6FVC-%Pred-Pre 103 %   Post FEV6/FVC ratio 100 %   FEV6FVC-%Pred-Post 103 %   FEF 25-75 Pre 3.95 L/sec   FEF2575-%Pred-Pre 97 %   FEF 25-75 Post 3.86 L/sec   FEF2575-%Pred-Post  95 %   FEF2575-%Change-Post -2 %   RV 1.79 L   RV % pred 78 %   TLC 6.79 L   TLC % pred 85 %   DLCO unc 28.66 ml/min/mmHg   DLCO unc % pred 83 %   DLCO cor 28.66 ml/min/mmHg   DLCO cor % pred 83 %   DL/VA 4.17 ml/min/mmHg/L   DL/VA % pred 95 %       Assessment & Plan:   1) Controlled type 1 diabetes mellitus without complications ([facility])  - Jesse Rodgers has currently uncontrolled symptomatic type 1 DM since 47 years of age.  He denies any history of diabetes ketoacidosis.  He presents today with his CGM and Omnipod 5 combo showing improved glycemic profile with slightly above target postprandial readings.  His POCT A1c today is 6.6%, improving from last visit of 7%.  Analysis of his CGM shows TIR 64%, TAR 36%, TBR 0% with a GMI of 7.4%.  He notes when he changes his pod, for several hours after, his glucose is higher than usual but eventually comes down.    -his diabetes is complicated by chronic heavy smoking and Jesse Rodgers remains at a high risk for more acute and chronic complications which include CAD, CVA, CKD, retinopathy, and neuropathy. These are all discussed in detail with the patient.  The following Lifestyle Medicine recommendations according to [facility]) were discussed and offered to patient and he agrees to start the journey:  A. Whole Foods, Plant-based plate comprising of fruits and vegetables, plant-based proteins, whole-grain carbohydrates was discussed in detail with the patient.   A list for source of those nutrients were also provided to the patient.  Patient will use only water or unsweetened tea for hydration. B.  The need to stay away from risky substances including alcohol, smoking; obtaining 7 to 9 hours  of restorative sleep, at least 150 minutes of moderate intensity exercise weekly, the importance of healthy social connections,  and stress reduction techniques were discussed. C.  A full color page of  Calorie density of various food groups per pound showing examples of each food groups was provided to the patient.  - Nutritional counseling repeated at each appointment due to patients tendency to fall back in to old habits.  - The patient admits there is a room for improvement in their diet and drink choices. -  Suggestion is made for the patient to avoid simple carbohydrates from their diet including Cakes, Sweet Desserts / Pastries, Ice Cream, Soda (diet and regular), Sweet Tea, Candies, Chips, Cookies, Sweet Pastries, Store Bought Juices, Alcohol in Excess of 1-2 drinks a day, Artificial Sweeteners, Coffee Creamer, and "Sugar-free" Products. This will help patient to have stable blood glucose profile and potentially avoid unintended weight gain.   - I encouraged the patient to switch to unprocessed or minimally processed complex starch and increased protein intake (animal or plant source), fruits, and vegetables.   - Patient is advised to stick to a routine mealtimes to eat 3 meals a day and avoid unnecessary snacks (to snack only to correct hypoglycemia).  - I have approached him with the following individualized plan to manage diabetes and patient agrees:   -He has clearly benefited from his CGM device, he is advised to continue using it.  -He is doing well with his Omnipod 5 switch.  I did make slight adjustment to his active insulin time today changing it to 3 hours rather than  4 hours which may help the pump bring his high spikes in glucose down a bit faster.  -He is advised to continue monitoring blood glucose 4 times daily, before meals and before bedtime using his CGM, and call the clinic if he has readings less than 70 or greater than 300 for 3 tests in a row.   - Patient is warned  not to take insulin without proper monitoring per orders. -Adjustment parameters are given for hypo and hyperglycemia in writing.  - Patient specific target  A1c;  LDL, HDL, Triglycerides  were discussed in detail.  2) BP/HTN:  Blood pressure is controlled to target.  He is advised to continue his current blood pressure medications including Losartan 25 mg p.o. daily and Lasix 20 mg po daily.    3) Lipids/HPL:  His most recent lipid panel from 08/06/21 shows controlled LDL of 52.  He is advised to continue Crestor 20 mg po daily at bedtime and his Omega 3 Fatty acids.    Also advised him to avoid fried foods and butter.   Will recheck lipid panel prior to next visit.  4)  Weight/Diet: His Body mass index is 30.1 kg/m.-not a candidate for major weight loss.  CDE Consult has been initiated , exercise, and detailed carbohydrates information provided.  5) Chronic Care/Health Maintenance: -he is on ARB and Statin medications and is encouraged to initiate and continue to follow up with Ophthalmology, Dentist,  Podiatrist at least yearly or according to recommendations, and he is advised to stay away from smoking.  I have recommended yearly flu vaccine and pneumonia vaccine at least every 5 years; moderate intensity exercise for up to 150 minutes weekly; and sleep for at least 7 hours a day.  - I advised patient to maintain close follow up with Sharilyn Sites, MD for primary care needs.     I spent  40  minutes in the care of the patient today including review of labs from [facility], Lipids, Thyroid Function, Hematology (current and previous including abstractions from other facilities); face-to-face time discussing  his blood glucose readings/logs, discussing hypoglycemia and hyperglycemia episodes and symptoms, medications doses, his options of short and long term treatment based on the latest standards of care / guidelines;  discussion about incorporating lifestyle medicine;  and documenting the encounter.  Risk reduction counseling performed per USPSTF guidelines to reduce obesity and cardiovascular risk factors.     Please refer to Patient Instructions for Blood Glucose Monitoring and Insulin/Medications Dosing Guide"  in media tab for additional information. Please  also refer to " Patient Self Inventory" in the Media  tab for reviewed elements of pertinent patient history.  Jesse Rodgers participated in the discussions, expressed understanding, and voiced agreement with the above plans.  All questions were answered to his satisfaction. he is encouraged to contact clinic should he have any questions or concerns prior to his return visit.    Follow up plan: - Return in about 4 months (around 08/19/2022) for Diabetes F/U with A1c in office, Previsit labs, Bring meter and logs.  Rayetta Pigg, [facility] [address] [place], [place] [address] Phone: [phone] Fax: [phone]  04/21/2022, 7:26 AM

## 2022-05-10 ENCOUNTER — Other Ambulatory Visit: Payer: Self-pay | Admitting: Nurse Practitioner

## 2022-05-24 ENCOUNTER — Other Ambulatory Visit: Payer: Self-pay | Admitting: Nurse Practitioner

## 2022-05-24 DIAGNOSIS — E1065 Type 1 diabetes mellitus with hyperglycemia: Secondary | ICD-10-CM

## 2022-06-09 DIAGNOSIS — E1142 Type 2 diabetes mellitus with diabetic polyneuropathy: Secondary | ICD-10-CM | POA: Diagnosis not present

## 2022-06-24 ENCOUNTER — Telehealth: Payer: Self-pay

## 2022-06-24 DIAGNOSIS — L4050 Arthropathic psoriasis, unspecified: Secondary | ICD-10-CM | POA: Diagnosis not present

## 2022-06-24 DIAGNOSIS — Z6829 Body mass index (BMI) 29.0-29.9, adult: Secondary | ICD-10-CM | POA: Diagnosis not present

## 2022-06-24 DIAGNOSIS — E663 Overweight: Secondary | ICD-10-CM | POA: Diagnosis not present

## 2022-06-24 DIAGNOSIS — E1165 Type 2 diabetes mellitus with hyperglycemia: Secondary | ICD-10-CM | POA: Diagnosis not present

## 2022-06-24 DIAGNOSIS — I10 Essential (primary) hypertension: Secondary | ICD-10-CM | POA: Diagnosis not present

## 2022-06-24 NOTE — Telephone Encounter (Signed)
PA renewal request received via CMM for Omnipod 5 G6 Pods (Gen 5)  PA has been submitted to [facility] of [place] and is pending determination  Key: [id number]

## 2022-06-26 NOTE — Telephone Encounter (Signed)
Pharmacy Patient Advocate Encounter  Prior Authorization for Omnipod 5 G6 Pods (Gen 5) has been approved by [employer]  (ins).    PA # [id number] Effective dates: 05/26/22 through 06/25/23

## 2022-08-14 ENCOUNTER — Other Ambulatory Visit: Payer: Self-pay | Admitting: Nurse Practitioner

## 2022-08-18 LAB — COMPREHENSIVE METABOLIC PANEL
ALT: 35 IU/L (ref 0–44)
AST: 26 IU/L (ref 0–40)
Albumin: 4.5 g/dL (ref 4.1–5.1)
Alkaline Phosphatase: 78 IU/L (ref 44–121)
BUN/Creatinine Ratio: 29 — ABNORMAL HIGH (ref 9–20)
BUN: 28 mg/dL — ABNORMAL HIGH (ref 6–24)
Bilirubin Total: 0.3 mg/dL (ref 0.0–1.2)
CO2: 23 mmol/L (ref 20–29)
Calcium: 9.4 mg/dL (ref 8.7–10.2)
Chloride: 102 mmol/L (ref 96–106)
Creatinine, Ser: 0.97 mg/dL (ref 0.76–1.27)
Globulin, Total: 2.3 g/dL (ref 1.5–4.5)
Glucose: 81 mg/dL (ref 70–99)
Potassium: 4.3 mmol/L (ref 3.5–5.2)
Sodium: 142 mmol/L (ref 134–144)
Total Protein: 6.8 g/dL (ref 6.0–8.5)
eGFR: 96 mL/min/{1.73_m2} (ref >59)

## 2022-08-18 LAB — LIPID PANEL
Chol/HDL Ratio: 4.4 ratio (ref 0.0–5.0)
Cholesterol, Total: 196 mg/dL (ref 100–199)
HDL: 45 mg/dL (ref >39)
LDL Chol Calc (NIH): 65 mg/dL (ref 0–99)
Triglycerides: 561 mg/dL (ref 0–149)
VLDL Cholesterol Cal: 86 mg/dL — ABNORMAL HIGH (ref 5–40)

## 2022-08-18 LAB — VITAMIN D 25 HYDROXY (VIT D DEFICIENCY, FRACTURES): Vit D, 25-Hydroxy: 30.4 ng/mL (ref 30.0–100.0)

## 2022-08-18 LAB — T4, FREE: Free T4: 0.94 ng/dL (ref 0.82–1.77)

## 2022-08-18 LAB — TSH: TSH: 1.6 u[IU]/mL (ref 0.450–4.500)

## 2022-08-20 ENCOUNTER — Ambulatory Visit (INDEPENDENT_AMBULATORY_CARE_PROVIDER_SITE_OTHER): Payer: BC Managed Care – PPO | Admitting: Nurse Practitioner

## 2022-08-20 ENCOUNTER — Encounter: Payer: Self-pay | Admitting: Nurse Practitioner

## 2022-08-20 VITALS — BP 119/70 | HR 79 | Ht 75.0 in | Wt 237.8 lb

## 2022-08-20 DIAGNOSIS — I10 Essential (primary) hypertension: Secondary | ICD-10-CM | POA: Diagnosis not present

## 2022-08-20 DIAGNOSIS — E1065 Type 1 diabetes mellitus with hyperglycemia: Secondary | ICD-10-CM

## 2022-08-20 DIAGNOSIS — E782 Mixed hyperlipidemia: Secondary | ICD-10-CM | POA: Diagnosis not present

## 2022-08-20 DIAGNOSIS — Z794 Long term (current) use of insulin: Secondary | ICD-10-CM | POA: Diagnosis not present

## 2022-08-20 LAB — POCT GLYCOSYLATED HEMOGLOBIN (HGB A1C): Hemoglobin A1C: 7 % — AB (ref 4.0–5.6)

## 2022-08-20 LAB — POCT UA - MICROALBUMIN
Albumin/Creatinine Ratio, Urine, POC: <30
Creatinine, POC: 300 mg/dL
Microalbumin Ur, POC: 30 mg/L

## 2022-08-20 MED ORDER — ROSUVASTATIN CALCIUM 20 MG PO TABS: ORAL_TABLET | ORAL | 3 refills | Status: DC

## 2022-08-20 MED ORDER — FENOFIBRATE 145 MG PO TABS: 145.0000 mg | ORAL_TABLET | Freq: Every day | ORAL | 3 refills | Status: DC

## 2022-08-20 MED ORDER — GLUCAGON EMERGENCY 1 MG IJ KIT: 1.0000 mg | PACK | Freq: Once | INTRAMUSCULAR | 12 refills | Status: DC | PRN

## 2022-08-20 NOTE — Progress Notes (Signed)
08/20/2022, 4:16 PM             Endocrinology follow-up note   Subjective:    Patient ID: Jesse Rodgers, male    DOB: [birth year].  Jesse Rodgers is being seen in follow-up for management of currently uncontrolled symptomatic diabetes requested by  Assunta Found, MD.   Past Medical History:  Diagnosis Date   Arthritis    back    Depression    takes Zoloft daily   Diabetes mellitus without complication (HCC)    takes Toujeo and Novolog nightly;average fasting blood sugar runs 150   History of kidney stones    Hyperlipidemia    takes Simvastatin daily   Hypertension    takes Losartan daily   Past Surgical History:  Procedure Laterality Date   ABDOMINAL SURGERY     APPENDECTOMY     BACK SURGERY     DENTAL SURGERY  12/10/2014   molar tooth #  [id number],15,[id number]  bone biopsy  of ramus   PARTIAL COLECTOMY     TOOTH EXTRACTION N/A 12/10/2014   Procedure: EXTRACTION MOLARS #14,15,16  INTRAORAL AND EXTRAORAL INCISION AND DRAINAGE AND BONE BIOPSY OF LEFT RAMUS;  Surgeon: [name], DDS;  Location: [facility] OR;  Service: Oral Surgery;  Laterality: N/A;   Social History   Socioeconomic History   Marital status: Married    Spouse name: Not on file   Number of children: Not on file   Years of education: Not on file   Highest education level: Not on file  Occupational History   Not on file  Tobacco Use   Smoking status: Former    Packs/day: 1.00    Years: 30.00    Additional pack years: 0.00    Total pack years: 30.00    Types: Cigarettes    Quit date: 02/08/2019    Years since quitting: 3.5   Smokeless tobacco: Never   Tobacco comments:    Pt no longer vapes as of December 1,2023 LW  Vaping Use   Vaping Use: Every day  Substance and Sexual Activity   Alcohol use: Yes    Comment: occasional   Drug use: No   Sexual activity: Yes  Other Topics Concern   Not on file  Social History  Narrative   Not on file   Social Determinants of Health   Financial Resource Strain: Not on file  Food Insecurity: Not on file  Transportation Needs: Not on file  Physical Activity: Not on file  Stress: Not on file  Social Connections: Not on file   Outpatient Encounter Medications as of 08/20/2022  Medication Sig   albuterol (VENTOLIN HFA) 108 (90 Base) MCG/ACT inhaler Inhale 2 puffs into the lungs every 6 (six) hours as needed.   Continuous Blood Gluc Sensor (DEXCOM G6 SENSOR) MISC Apply new sensor every 10 days as directed.   Continuous Blood Gluc Sensor (DEXCOM G6 SENSOR) MISC Check glucose continuously   Continuous Blood Gluc Sensor (DEXCOM G6 SENSOR) MISC APPLY 1 SENSOR TO SKIN TO MONITOR GLUCOSE CONTINUOUSLY AS DIRECTED. CHANGE SENSOR EVERY 10 DAYS.   Continuous Blood  Gluc Transmit (DEXCOM G6 TRANSMITTER) MISC USE TO TEST BLOOD GLUCOSE 4-5 TIMES DAILY   ENBREL SURECLICK 50 MG/ML injection Inject 50 mg into the skin once a week.   fenofibrate (TRICOR) 145 MG tablet Take 1 tablet (145 mg total) by mouth daily.   furosemide (LASIX) 20 MG tablet Take 20 mg by mouth daily as needed.   HUMIRA PEN 40 MG/0.4ML PNKT SMARTSIG:40 Milligram(s) SUB-Q Every 2 Weeks   insulin aspart (NOVOLOG) 100 UNIT/ML injection Use with Omnipod for TDD around 100 units daily   Insulin Disposable Pump (OMNIPOD 5 G6 INTRO, GEN 5,) KIT Change pod every 48-72 hrs   Insulin Disposable Pump (OMNIPOD 5 G6 PODS, GEN 5,) MISC Change pod every 48-72 hrs   metoprolol tartrate (LOPRESSOR) 25 MG tablet Take 1 tablet (25 mg total) by mouth 2 (two) times daily.   Multiple Vitamin (MULTIVITAMIN WITH MINERALS) TABS tablet Take 1 tablet by mouth daily.   NON FORMULARY Taking Total Beet product daily   sertraline (ZOLOFT) 100 MG tablet Take 1 tablet (100 mg total) by mouth daily.   tiZANidine (ZANAFLEX) 4 MG tablet Take 1 tablet (4 mg total) by mouth at bedtime as needed for muscle spasms. Do not take with alcohol or while  driving or operating heavy machinery.  May cause drowsiness.   [DISCONTINUED] glucagon (GLUCAGON EMERGENCY) 1 MG injection Inject 1 mg into the vein once as needed.   [DISCONTINUED] rosuvastatin (CRESTOR) 20 MG tablet TAKE 1 TABLET(20 MG) BY MOUTH DAILY   Glucagon, rDNA, (GLUCAGON EMERGENCY) 1 MG KIT Inject 1 mg into the vein once as needed.   rosuvastatin (CRESTOR) 20 MG tablet TAKE 1 TABLET(20 MG) BY MOUTH DAILY   No facility-administered encounter medications on file as of 08/20/2022.    ALLERGIES: Allergies  Allergen Reactions   Bactrim [Sulfamethoxazole-Trimethoprim] Rash    VACCINATION STATUS: Immunization History  Administered Date(s) Administered   Influenza,inj,Quad PF,6+ Mos 12/22/2021   Influenza-Unspecified 11/23/2014    Diabetes He presents for his follow-up diabetic visit. He has type 1 diabetes mellitus. Onset time: He was diagnosed at approximate age of 8 years. His disease course has been stable. There are no hypoglycemic associated symptoms. Pertinent negatives for hypoglycemia include no confusion, headaches, pallor or seizures. Pertinent negatives for diabetes include no chest pain, no fatigue, no polydipsia, no polyphagia, no polyuria and no weakness. There are no hypoglycemic complications. Symptoms are stable. There are no diabetic complications. Risk factors for coronary artery disease include dyslipidemia, diabetes mellitus, hypertension, family history, male sex, tobacco exposure and sedentary lifestyle. Current diabetic treatment includes insulin pump. He is compliant with treatment most of the time. His weight is fluctuating minimally. He is following a generally unhealthy diet. When asked about meal planning, he reported none. He has not had a previous visit with a dietitian. He never participates in exercise. His home blood glucose trend is fluctuating minimally. His overall blood glucose range is 140-180 mg/dl. (He presents today with his CGM and Omnipod 5 combo  showing stable glycemic profile.  His POCT A1c today is 7%,unchanged from last visit.  Analysis of his CGM shows TIR 68%, TAR 31%, TBR <1% with a GMI of 7.2%.  He notes when he changes his pod, for several hours after, his glucose is higher than usual but eventually comes down.  He also notes he used his Glucagon pen since last visit.  He was not unconscious but his wife gave it to him to help bring his low glucose up (he could  not recall the specifics of when he used it and why).) An ACE inhibitor/angiotensin II receptor blocker is being taken. He sees a podiatrist.Eye exam is current.  Hyperlipidemia This is a chronic problem. The current episode started more than 1 year ago. The problem is uncontrolled. Recent lipid tests were reviewed and are variable. Exacerbating diseases include diabetes and obesity. Factors aggravating his hyperlipidemia include fatty foods. Pertinent negatives include no chest pain, myalgias or shortness of breath. Current antihyperlipidemic treatment includes statins. The current treatment provides mild improvement of lipids. Compliance problems include adherence to diet and adherence to exercise.  Risk factors for coronary artery disease include diabetes mellitus, dyslipidemia, hypertension, male sex, a sedentary lifestyle and obesity.  Hypertension This is a chronic problem. The current episode started more than 1 year ago. The problem has been gradually improving since onset. The problem is controlled. Pertinent negatives include no chest pain, headaches, neck pain, palpitations or shortness of breath. There are no associated agents to hypertension. Risk factors for coronary artery disease include diabetes mellitus, dyslipidemia, male gender, smoking/tobacco exposure, sedentary lifestyle and obesity. Past treatments include ACE inhibitors and diuretics. The current treatment provides mild improvement. Compliance problems include exercise and diet.     Review of  systems  Constitutional: + minimally fluctuating body weight,  current Body mass index is 29.72 kg/m. , + fatigue, no subjective hyperthermia, no subjective hypothermia Eyes: no blurry vision, no xerophthalmia ENT: no sore throat, no nodules palpated in throat, no dysphagia/odynophagia, no hoarseness Cardiovascular: no chest pain, no shortness of breath, no palpitations, no leg swelling Respiratory: no cough, no shortness of breath Gastrointestinal: no nausea/vomiting/diarrhea Musculoskeletal: no muscle/joint aches, generalized joint swelling Skin: no rashes, no hyperemia Neurological: no tremors, no numbness, no tingling, no dizziness Psychiatric: no depression, no anxiety   Objective:    BP 119/70 (BP Location: Right Arm, Patient Position: Sitting, Cuff Size: Large)   Pulse 79   Ht 6\' 3"  (1.905 m)   Wt 237 lb 12.8 oz (107.9 kg)   BMI 29.72 kg/m   Wt Readings from Last 3 Encounters:  08/20/22 237 lb 12.8 oz (107.9 kg)  04/20/22 240 lb 12.8 oz (109.2 kg)  02/13/22 236 lb (107 kg)    BP Readings from Last 3 Encounters:  08/20/22 119/70  04/20/22 138/80  03/05/22 132/79     Physical Exam- Limited  Constitutional:  Body mass index is 29.72 kg/m. , not in acute distress, normal state of mind Eyes:  EOMI, no exophthalmos Musculoskeletal: no gross deformities, strength intact in all four extremities, no gross restriction of joint movements Skin:  no rashes, no hyperemia Neurological: no tremor with outstretched hands    Recent Results (from the past 2160 hour(s))  Comprehensive metabolic panel     Status: Abnormal   Collection Time: 08/17/22  2:51 PM  Result Value Ref Range   Glucose 81 70 - 99 mg/dL   BUN 28 (H) 6 - 24 mg/dL   Creatinine, Ser 1.61 0.76 - 1.27 mg/dL   eGFR 96 >09 UE/AVW/0.98   BUN/Creatinine Ratio 29 (H) 9 - 20   Sodium 142 134 - 144 mmol/L   Potassium 4.3 3.5 - 5.2 mmol/L   Chloride 102 96 - 106 mmol/L   CO2 23 20 - 29 mmol/L   Calcium 9.4 8.7 -  10.2 mg/dL   Total Protein 6.8 6.0 - 8.5 g/dL   Albumin 4.5 4.1 - 5.1 g/dL   Globulin, Total 2.3 1.5 - 4.5 g/dL   Bilirubin  Total 0.3 0.0 - 1.2 mg/dL   Alkaline Phosphatase 78 44 - 121 IU/L   AST 26 0 - 40 IU/L   ALT 35 0 - 44 IU/L  Lipid panel     Status: Abnormal   Collection Time: 08/17/22  2:51 PM  Result Value Ref Range   Cholesterol, Total 196 100 - 199 mg/dL   Triglycerides 161 (HH) 0 - 149 mg/dL   HDL 45 >09 mg/dL   VLDL Cholesterol Cal 86 (H) 5 - 40 mg/dL   LDL Chol Calc (NIH) 65 0 - 99 mg/dL   Chol/HDL Ratio 4.4 0.0 - 5.0 ratio    Comment:                                   T. Chol/HDL Ratio                                             Men  Women                               1/2 Avg.Risk  3.4    3.3                                   Avg.Risk  5.0    4.4                                2X Avg.Risk  9.6    7.1                                3X Avg.Risk 23.4   11.0   TSH     Status: None   Collection Time: 08/17/22  2:51 PM  Result Value Ref Range   TSH 1.600 0.450 - 4.500 uIU/mL  T4, free     Status: None   Collection Time: 08/17/22  2:51 PM  Result Value Ref Range   Free T4 0.94 0.82 - 1.77 ng/dL  VITAMIN D 25 Hydroxy (Vit-D Deficiency, Fractures)     Status: None   Collection Time: 08/17/22  2:51 PM  Result Value Ref Range   Vit D, 25-Hydroxy 30.4 30.0 - 100.0 ng/mL    Comment: Vitamin D deficiency has been defined by the Institute of Medicine and an Endocrine Society practice guideline as a level of serum 25-OH vitamin D less than 20 ng/mL (1,2). The Endocrine Society went on to further define vitamin D insufficiency as a level between 21 and 29 ng/mL (2). 1. IOM (Institute of Medicine). 2010. Dietary reference    intakes for calcium and D. [place]: The    [employer]. 2. Holick MF, Binkley Lincoln, Bischoff-Ferrari HA, et al.    Evaluation, treatment, and prevention of vitamin D    deficiency: an Endocrine Society clinical practice    guideline.  JCEM. 2011 Jul; 96(7):1911-30.   POCT UA - Microalbumin     Status: Normal   Collection Time: 08/20/22  3:25 PM  Result Value Ref Range   Microalbumin Ur, POC 30 mg/L mg/L  Creatinine, POC 300 mg/dL mg/dL   Albumin/Creatinine Ratio, Urine, POC <30 mg/G   HgB A1c     Status: Abnormal   Collection Time: 08/20/22  3:26 PM  Result Value Ref Range   Hemoglobin A1C 7.0 (A) 4.0 - 5.6 %   HbA1c POC (<> result, manual entry)     HbA1c, POC (prediabetic range)     HbA1c, POC (controlled diabetic range)         Assessment & Plan:   1) Controlled type 1 diabetes mellitus without complications (HCC)  - Jesse Rodgers has currently uncontrolled symptomatic type 1 DM since 47 years of age.  He denies any history of diabetes ketoacidosis.  He presents today with his CGM and Omnipod 5 combo showing stable glycemic profile.  His POCT A1c today is 7%,unchanged from last visit.  Analysis of his CGM shows TIR 68%, TAR 31%, TBR <1% with a GMI of 7.2%.  He notes when he changes his pod, for several hours after, his glucose is higher than usual but eventually comes down.  He also notes he used his Glucagon pen since last visit.  He was not unconscious but his wife gave it to him to help bring his low glucose up (he could not recall the specifics of when he used it and why).   -his diabetes is complicated by chronic heavy smoking and Jesse Rodgers remains at a high risk for more acute and chronic complications which include CAD, CVA, CKD, retinopathy, and neuropathy. These are all discussed in detail with the patient.  The following Lifestyle Medicine recommendations according to American College of Lifestyle Medicine [facility]) were discussed and offered to patient and he agrees to start the journey:  A. Whole Foods, Plant-based plate comprising of fruits and vegetables, plant-based proteins, whole-grain carbohydrates was discussed in detail with the patient.   A list for source of those nutrients were also  provided to the patient.  Patient will use only water or unsweetened tea for hydration. B.  The need to stay away from risky substances including alcohol, smoking; obtaining 7 to 9 hours of restorative sleep, at least 150 minutes of moderate intensity exercise weekly, the importance of healthy social connections,  and stress reduction techniques were discussed. C.  A full color page of  Calorie density of various food groups per pound showing examples of each food groups was provided to the patient.  - Nutritional counseling repeated at each appointment due to patients tendency to fall back in to old habits.  - The patient admits there is a room for improvement in their diet and drink choices. -  Suggestion is made for the patient to avoid simple carbohydrates from their diet including Cakes, Sweet Desserts / Pastries, Ice Cream, Soda (diet and regular), Sweet Tea, Candies, Chips, Cookies, Sweet Pastries, Store Bought Juices, Alcohol in Excess of 1-2 drinks a day, Artificial Sweeteners, Coffee Creamer, and "Sugar-free" Products. This will help patient to have stable blood glucose profile and potentially avoid unintended weight gain.   - I encouraged the patient to switch to unprocessed or minimally processed complex starch and increased protein intake (animal or plant source), fruits, and vegetables.   - Patient is advised to stick to a routine mealtimes to eat 3 meals a day and avoid unnecessary snacks (to snack only to correct hypoglycemia).  - I have approached him with the following individualized plan to manage diabetes and patient agrees:   -He has clearly benefited from his CGM device,  he is advised to continue using it.  -He is doing well with his Omnipod 5 switch.  I did not make any changes to his pump settings today.  We did talk about upgrading to the Omnipod 5 G7 combo once his pods are compatible (that he gets from medical supply company) and once he updates the software on his PDM  (coming in July).  I did also refill his Glucagon pen.  We talked about other strategies to bring up a low glucose reading as well.  -He is advised to continue monitoring blood glucose 4 times daily, before meals and before bedtime using his CGM, and call the clinic if he has readings less than 70 or greater than 300 for 3 tests in a row.   - Patient is warned not to take insulin without proper monitoring per orders. -Adjustment parameters are given for hypo and hyperglycemia in writing.  - Patient specific target  A1c;  LDL, HDL, Triglycerides  were discussed in detail.  2) BP/HTN:  Blood pressure is controlled to target.  He is advised to continue his current blood pressure medications including Losartan 25 mg p.o. daily and Lasix 20 mg po daily.    3) Lipids/HPL:  His most recent lipid panel from 08/17/22 shows high triglycerides of 561.  He is advised to continue Crestor 20 mg po daily at bedtime and his Omega 3 Fatty acids.    Also advised him to avoid fried foods and butter.   I also started him on Fenofibrate 145 mg po daily.  4)  Weight/Diet: His Body mass index is 29.72 kg/m.-not a candidate for major weight loss.  CDE Consult has been initiated , exercise, and detailed carbohydrates information provided.  5) Chronic Care/Health Maintenance: -he is on ARB and Statin medications and is encouraged to initiate and continue to follow up with Ophthalmology, Dentist,  Podiatrist at least yearly or according to recommendations, and he is advised to stay away from smoking.  I have recommended yearly flu vaccine and pneumonia vaccine at least every 5 years; moderate intensity exercise for up to 150 minutes weekly; and sleep for at least 7 hours a day.  - I advised patient to maintain close follow up with Assunta Found, MD for primary care needs.     I spent  40  minutes in the care of the patient today including review of labs from CMP, Lipids, Thyroid Function, Hematology (current and  previous including abstractions from other facilities); face-to-face time discussing  his blood glucose readings/logs, discussing hypoglycemia and hyperglycemia episodes and symptoms, medications doses, his options of short and long term treatment based on the latest standards of care / guidelines;  discussion about incorporating lifestyle medicine;  and documenting the encounter. Risk reduction counseling performed per USPSTF guidelines to reduce obesity and cardiovascular risk factors.     Please refer to Patient Instructions for Blood Glucose Monitoring and Insulin/Medications Dosing Guide"  in media tab for additional information. Please  also refer to " Patient Self Inventory" in the Media  tab for reviewed elements of pertinent patient history.  Jesse Rodgers participated in the discussions, expressed understanding, and voiced agreement with the above plans.  All questions were answered to his satisfaction. he is encouraged to contact clinic should he have any questions or concerns prior to his return visit.    Follow up plan: - Return in about 4 months (around 12/20/2022) for Diabetes F/U with A1c in office, No previsit labs, Bring meter and logs.  Ronny Bacon, [facility] [address] [place], [place] [address] Phone: [phone] Fax: [phone]  08/20/2022, 4:16 PM

## 2022-08-25 ENCOUNTER — Telehealth: Payer: Self-pay

## 2022-08-25 ENCOUNTER — Other Ambulatory Visit: Payer: Self-pay

## 2022-08-25 MED ORDER — GLUCAGON EMERGENCY 1 MG IJ KIT: 1.0000 mg | PACK | Freq: Once | INTRAMUSCULAR | 1 refills | Status: DC | PRN

## 2022-08-25 MED ORDER — GLUCAGON EMERGENCY 1 MG IJ KIT: 1.0000 mg | PACK | Freq: Once | INTRAMUSCULAR | 1 refills | Status: AC | PRN

## 2022-08-25 NOTE — Telephone Encounter (Signed)
Noted  

## 2022-08-25 NOTE — Telephone Encounter (Signed)
I fixed it

## 2022-08-25 NOTE — Telephone Encounter (Signed)
Marcelino Duster with [facility] called stating they received a Rx for glucagon 1mg  with directions to inject via vein. States they need clarification for directions.

## 2022-08-31 DIAGNOSIS — M79642 Pain in left hand: Secondary | ICD-10-CM | POA: Diagnosis not present

## 2022-08-31 DIAGNOSIS — L4050 Arthropathic psoriasis, unspecified: Secondary | ICD-10-CM | POA: Diagnosis not present

## 2022-08-31 DIAGNOSIS — M545 Low back pain, unspecified: Secondary | ICD-10-CM | POA: Diagnosis not present

## 2022-08-31 DIAGNOSIS — M2560 Stiffness of unspecified joint, not elsewhere classified: Secondary | ICD-10-CM | POA: Diagnosis not present

## 2022-09-06 ENCOUNTER — Other Ambulatory Visit: Payer: Self-pay | Admitting: Cardiovascular Disease

## 2022-09-24 ENCOUNTER — Telehealth: Payer: Self-pay | Admitting: Nurse Practitioner

## 2022-09-24 NOTE — Telephone Encounter (Signed)
Was going to send a message to Ronny Bacon, but she was available to ask the question.

## 2022-10-13 ENCOUNTER — Other Ambulatory Visit: Payer: Self-pay | Admitting: Cardiovascular Disease

## 2022-10-21 ENCOUNTER — Other Ambulatory Visit: Payer: Self-pay | Admitting: Nurse Practitioner

## 2022-12-01 DIAGNOSIS — M2560 Stiffness of unspecified joint, not elsewhere classified: Secondary | ICD-10-CM | POA: Diagnosis not present

## 2022-12-01 DIAGNOSIS — M1991 Primary osteoarthritis, unspecified site: Secondary | ICD-10-CM | POA: Diagnosis not present

## 2022-12-01 DIAGNOSIS — L4050 Arthropathic psoriasis, unspecified: Secondary | ICD-10-CM | POA: Diagnosis not present

## 2022-12-01 DIAGNOSIS — Z111 Encounter for screening for respiratory tuberculosis: Secondary | ICD-10-CM | POA: Diagnosis not present

## 2022-12-01 DIAGNOSIS — M25561 Pain in right knee: Secondary | ICD-10-CM | POA: Diagnosis not present

## 2022-12-08 DIAGNOSIS — E1142 Type 2 diabetes mellitus with diabetic polyneuropathy: Secondary | ICD-10-CM | POA: Diagnosis not present

## 2022-12-21 ENCOUNTER — Ambulatory Visit (INDEPENDENT_AMBULATORY_CARE_PROVIDER_SITE_OTHER): Payer: BC Managed Care – PPO | Admitting: Nurse Practitioner

## 2022-12-21 ENCOUNTER — Encounter: Payer: Self-pay | Admitting: Nurse Practitioner

## 2022-12-21 VITALS — BP 128/80 | HR 93 | Ht 75.0 in | Wt 237.2 lb

## 2022-12-21 DIAGNOSIS — E782 Mixed hyperlipidemia: Secondary | ICD-10-CM

## 2022-12-21 DIAGNOSIS — I10 Essential (primary) hypertension: Secondary | ICD-10-CM | POA: Diagnosis not present

## 2022-12-21 DIAGNOSIS — E1065 Type 1 diabetes mellitus with hyperglycemia: Secondary | ICD-10-CM

## 2022-12-21 DIAGNOSIS — Z794 Long term (current) use of insulin: Secondary | ICD-10-CM | POA: Diagnosis not present

## 2022-12-21 LAB — POCT GLYCOSYLATED HEMOGLOBIN (HGB A1C): Hemoglobin A1C: 6.8 % — AB (ref 4.0–5.6)

## 2022-12-21 MED ORDER — DEXCOM G6 TRANSMITTER MISC: 1 refills | Status: DC

## 2022-12-21 MED ORDER — INSULIN ASPART 100 UNIT/ML IJ SOLN: INTRAMUSCULAR | 0 refills | Status: DC

## 2022-12-21 MED ORDER — DEXCOM G6 SENSOR MISC
3 refills | Status: AC
Start: 2022-12-21 — End: ?

## 2022-12-21 MED ORDER — OMNIPOD 5 DEXG7G6 PODS GEN 5 MISC: 6 refills | Status: DC

## 2022-12-21 NOTE — Progress Notes (Addendum)
12/21/2022, 3:35 PM             Endocrinology follow-up note   Subjective:    Patient ID: Jesse Rodgers, male    DOB: [birth year].  Lawana Pai is being seen in follow-up for management of currently uncontrolled symptomatic diabetes requested by  Assunta Found, MD.   Past Medical History:  Diagnosis Date   Arthritis    back    Depression    takes Zoloft daily   Diabetes mellitus without complication (HCC)    takes Toujeo and Novolog nightly;average fasting blood sugar runs 150   History of kidney stones    Hyperlipidemia    takes Simvastatin daily   Hypertension    takes Losartan daily   Past Surgical History:  Procedure Laterality Date   ABDOMINAL SURGERY     APPENDECTOMY     BACK SURGERY     DENTAL SURGERY  12/10/2014   molar tooth #  14,15,16  bone biopsy  of ramus   PARTIAL COLECTOMY     TOOTH EXTRACTION N/A 12/10/2014   Procedure: EXTRACTION MOLARS #14,15,16  INTRAORAL AND EXTRAORAL INCISION AND DRAINAGE AND BONE BIOPSY OF LEFT RAMUS;  Surgeon: [name], DDS;  Location: [facility] OR;  Service: Oral Surgery;  Laterality: N/A;   Social History   Socioeconomic History   Marital status: Married    Spouse name: Not on file   Number of children: Not on file   Years of education: Not on file   Highest education level: Not on file  Occupational History   Not on file  Tobacco Use   Smoking status: Former    Current packs/day: 0.00    Average packs/day: 1 pack/day for 30.0 years (30.0 ttl pk-yrs)    Types: Cigarettes    Start date: 02/07/1989    Quit date: 02/08/2019    Years since quitting: 3.8   Smokeless tobacco: Never   Tobacco comments:    Pt no longer vapes as of December 1,2023 LW  Vaping Use   Vaping status: Every Day  Substance and Sexual Activity   Alcohol use: Yes    Comment: occasional   Drug use: No   Sexual activity: Yes  Other Topics Concern   Not  on file  Social History Narrative   Not on file   Social Determinants of Health   Financial Resource Strain: Not on file  Food Insecurity: Not on file  Transportation Needs: Not on file  Physical Activity: Not on file  Stress: Not on file  Social Connections: Not on file   Outpatient Encounter Medications as of 12/21/2022  Medication Sig   albuterol (VENTOLIN HFA) 108 (90 Base) MCG/ACT inhaler Inhale 2 puffs into the lungs every 6 (six) hours as needed.   Continuous Blood Gluc Sensor (DEXCOM G6 SENSOR) MISC Check glucose continuously   Continuous Blood Gluc Sensor (DEXCOM G6 SENSOR) MISC APPLY 1 SENSOR TO SKIN TO MONITOR GLUCOSE CONTINUOUSLY AS DIRECTED. CHANGE SENSOR EVERY 10 DAYS.   fenofibrate (TRICOR) 145 MG tablet Take 1 tablet (145 mg total) by mouth daily.   furosemide (LASIX) 20  MG tablet Take 20 mg by mouth daily as needed.   Glucagon, rDNA, (GLUCAGON EMERGENCY) 1 MG KIT Inject 1 mg into the muscle once as needed.   Insulin Disposable Pump (OMNIPOD 5 G6 INTRO, GEN 5,) KIT Change pod every 48-72 hrs   metoprolol tartrate (LOPRESSOR) 25 MG tablet Take 1 tablet (25 mg total) by mouth 2 (two) times daily.   Multiple Vitamin (MULTIVITAMIN WITH MINERALS) TABS tablet Take 1 tablet by mouth daily.   NON FORMULARY Taking Total Beet product daily   OTEZLA 30 MG TABS Take 1 tablet by mouth 2 (two) times daily.   rosuvastatin (CRESTOR) 20 MG tablet TAKE 1 TABLET(20 MG) BY MOUTH DAILY   sertraline (ZOLOFT) 100 MG tablet Take 1 tablet (100 mg total) by mouth daily.   tiZANidine (ZANAFLEX) 4 MG tablet Take 1 tablet (4 mg total) by mouth at bedtime as needed for muscle spasms. Do not take with alcohol or while driving or operating heavy machinery.  May cause drowsiness.   [DISCONTINUED] Continuous Blood Gluc Sensor (DEXCOM G6 SENSOR) MISC Apply new sensor every 10 days as directed.   [DISCONTINUED] Continuous Blood Gluc Transmit (DEXCOM G6 TRANSMITTER) MISC USE TO TEST BLOOD GLUCOSE 4-5 TIMES  DAILY   [DISCONTINUED] insulin aspart (NOVOLOG) 100 UNIT/ML injection USE WITH OMNIPOD FOR TOTAL DAILY DOSE AROUND 100 UNITS DAILY   [DISCONTINUED] Insulin Disposable Pump (OMNIPOD 5 G6 PODS, GEN 5,) MISC Change pod every 48-72 hrs   Continuous Glucose Sensor (DEXCOM G6 SENSOR) MISC Apply new sensor every 10 days as directed.   Continuous Glucose Transmitter (DEXCOM G6 TRANSMITTER) MISC USE TO TEST BLOOD GLUCOSE 4-5 TIMES DAILY   ENBREL SURECLICK 50 MG/ML injection Inject 50 mg into the skin once a week. (Patient not taking: Reported on 12/21/2022)   insulin aspart (NOVOLOG) 100 UNIT/ML injection USE WITH OMNIPOD FOR TOTAL DAILY DOSE AROUND 100 UNITS DAILY   Insulin Disposable Pump (OMNIPOD 5 DEXG7G6 PODS GEN 5) MISC Change pod every 48-72 hrs   [DISCONTINUED] HUMIRA PEN 40 MG/0.4ML PNKT SMARTSIG:40 Milligram(s) SUB-Q Every 2 Weeks (Patient not taking: Reported on 12/21/2022)   No facility-administered encounter medications on file as of 12/21/2022.    ALLERGIES: Allergies  Allergen Reactions   Bactrim [Sulfamethoxazole-Trimethoprim] Rash    VACCINATION STATUS: Immunization History  Administered Date(s) Administered   Influenza,inj,Quad PF,6+ Mos 12/22/2021   Influenza-Unspecified 11/23/2014    Diabetes He presents for his follow-up diabetic visit. He has type 1 diabetes mellitus. Onset time: He was diagnosed at approximate age of 8 years. His disease course has been improving. There are no hypoglycemic associated symptoms. Pertinent negatives for hypoglycemia include no confusion, headaches, pallor or seizures. Pertinent negatives for diabetes include no chest pain, no fatigue, no polydipsia, no polyphagia, no polyuria and no weakness. There are no hypoglycemic complications. Symptoms are stable. There are no diabetic complications. Risk factors for coronary artery disease include dyslipidemia, diabetes mellitus, hypertension, family history, male sex, tobacco exposure and sedentary  lifestyle. Current diabetic treatment includes insulin pump. He is compliant with treatment most of the time. His weight is fluctuating minimally. He is following a generally unhealthy diet. When asked about meal planning, he reported none. He has not had a previous visit with a dietitian. He never participates in exercise. His home blood glucose trend is decreasing steadily. His overall blood glucose range is 140-180 mg/dl. (He presents today with his CGM and Omnipod 5 combo showing stable, mostly at target glycemic profile.  His POCT A1c today is 6.8,  improving from last visit of 7%.  Analysis of his CGM shows TIR 69%, TAR 30%, TBR 1% with a GMI of 7.1%.  He denies any significant hypoglycemia since last visit.) An ACE inhibitor/angiotensin II receptor blocker is being taken. He sees a podiatrist.Eye exam is current.  Hyperlipidemia This is a chronic problem. The current episode started more than 1 year ago. The problem is uncontrolled. Recent lipid tests were reviewed and are variable. Exacerbating diseases include diabetes and obesity. Factors aggravating his hyperlipidemia include fatty foods. Pertinent negatives include no chest pain, myalgias or shortness of breath. Current antihyperlipidemic treatment includes statins. The current treatment provides mild improvement of lipids. Compliance problems include adherence to diet and adherence to exercise.  Risk factors for coronary artery disease include diabetes mellitus, dyslipidemia, hypertension, male sex, a sedentary lifestyle and obesity.  Hypertension This is a chronic problem. The current episode started more than 1 year ago. The problem has been gradually improving since onset. The problem is controlled. Pertinent negatives include no chest pain, headaches, neck pain, palpitations or shortness of breath. There are no associated agents to hypertension. Risk factors for coronary artery disease include diabetes mellitus, dyslipidemia, male gender,  smoking/tobacco exposure, sedentary lifestyle and obesity. Past treatments include ACE inhibitors and diuretics. The current treatment provides mild improvement. Compliance problems include exercise and diet.     Review of systems  Constitutional: + minimally fluctuating body weight,  current Body mass index is 29.65 kg/m. , + fatigue, no subjective hyperthermia, no subjective hypothermia Eyes: no blurry vision, no xerophthalmia ENT: no sore throat, no nodules palpated in throat, no dysphagia/odynophagia, no hoarseness Cardiovascular: no chest pain, no shortness of breath, no palpitations, no leg swelling Respiratory: no cough, no shortness of breath Gastrointestinal: no nausea/vomiting/diarrhea Musculoskeletal: no muscle/joint aches, generalized joint swelling Skin: no rashes, no hyperemia Neurological: no tremors, no numbness, no tingling, no dizziness Psychiatric: no depression, no anxiety   Objective:    BP 128/80 (BP Location: Left Arm, Patient Position: Sitting, Cuff Size: Large)   Pulse 93   Ht 6\' 3"  (1.905 m)   Wt 237 lb 3.2 oz (107.6 kg)   BMI 29.65 kg/m   Wt Readings from Last 3 Encounters:  12/21/22 237 lb 3.2 oz (107.6 kg)  08/20/22 237 lb 12.8 oz (107.9 kg)  04/20/22 240 lb 12.8 oz (109.2 kg)    BP Readings from Last 3 Encounters:  12/21/22 128/80  08/20/22 119/70  04/20/22 138/80     Physical Exam- Limited  Constitutional:  Body mass index is 29.65 kg/m. , not in acute distress, normal state of mind Eyes:  EOMI, no exophthalmos Musculoskeletal: no gross deformities, strength intact in all four extremities, no gross restriction of joint movements Skin:  no rashes, no hyperemia Neurological: no tremor with outstretched hands   Diabetic Foot Exam - Simple   No data filed       Recent Results (from the past 2160 hour(s))  HgB A1c     Status: Abnormal   Collection Time: 12/21/22  3:17 PM  Result Value Ref Range   Hemoglobin A1C 6.8 (A) 4.0 - 5.6 %    HbA1c POC (<> result, manual entry)     HbA1c, POC (prediabetic range)     HbA1c, POC (controlled diabetic range)          Assessment & Plan:   1) Controlled type 1 diabetes mellitus without complications (HCC)  - Lawana Pai has currently uncontrolled symptomatic type 1 DM since 47 years  of age.  He denies any history of diabetes ketoacidosis.  He presents today with his CGM and Omnipod 5 combo showing stable, mostly at target glycemic profile.  His POCT A1c today is 6.8, improving from last visit of 7%.  Analysis of his CGM shows TIR 69%, TAR 30%, TBR 1% with a GMI of 7.1%.  He denies any significant hypoglycemia since last visit.  -his diabetes is complicated by chronic heavy smoking and JAXSTIN ZENISEK remains at a high risk for more acute and chronic complications which include CAD, CVA, CKD, retinopathy, and neuropathy. These are all discussed in detail with the patient.  The following Lifestyle Medicine recommendations according to American College of Lifestyle Medicine [facility]) were discussed and offered to patient and he agrees to start the journey:  A. Whole Foods, Plant-based plate comprising of fruits and vegetables, plant-based proteins, whole-grain carbohydrates was discussed in detail with the patient.   A list for source of those nutrients were also provided to the patient.  Patient will use only water or unsweetened tea for hydration. B.  The need to stay away from risky substances including alcohol, smoking; obtaining 7 to 9 hours of restorative sleep, at least 150 minutes of moderate intensity exercise weekly, the importance of healthy social connections,  and stress reduction techniques were discussed. C.  A full color page of  Calorie density of various food groups per pound showing examples of each food groups was provided to the patient.  - Nutritional counseling repeated at each appointment due to patients tendency to fall back in to old habits.  - The patient  admits there is a room for improvement in their diet and drink choices. -  Suggestion is made for the patient to avoid simple carbohydrates from their diet including Cakes, Sweet Desserts / Pastries, Ice Cream, Soda (diet and regular), Sweet Tea, Candies, Chips, Cookies, Sweet Pastries, Store Bought Juices, Alcohol in Excess of 1-2 drinks a day, Artificial Sweeteners, Coffee Creamer, and "Sugar-free" Products. This will help patient to have stable blood glucose profile and potentially avoid unintended weight gain.   - I encouraged the patient to switch to unprocessed or minimally processed complex starch and increased protein intake (animal or plant source), fruits, and vegetables.   - Patient is advised to stick to a routine mealtimes to eat 3 meals a day and avoid unnecessary snacks (to snack only to correct hypoglycemia).  - I have approached him with the following individualized plan to manage diabetes and patient agrees:   -He has clearly benefited from his CGM device, he is advised to continue using it.  -He is doing well with his current Omnipod 5 settings, therefore, I did not make any changes to his pump settings today.    He was recently started on a new medication for his RA and thinks it may be dehydrating him as he is urinating a lot more.  Will recheck CMP.  -He is advised to continue monitoring blood glucose 4 times daily, before meals and before bedtime using his CGM, and call the clinic if he has readings less than 70 or greater than 300 for 3 tests in a row.   - Patient is warned not to take insulin without proper monitoring per orders. -Adjustment parameters are given for hypo and hyperglycemia in writing.  - Patient specific target  A1c;  LDL, HDL, Triglycerides  were discussed in detail.  2) BP/HTN:  Blood pressure is controlled to target.  He is advised to continue  his current blood pressure medications including Losartan 25 mg p.o. daily and Lasix 20 mg po daily.    3)  Lipids/HPL:  His most recent lipid panel from 08/17/22 shows high triglycerides of 561.  He is advised to continue Crestor 20 mg po daily at bedtime and his Omega 3 Fatty acids.    Also advised him to avoid fried foods and butter.   I also started him on Fenofibrate 145 mg po daily.  4)  Weight/Diet: His Body mass index is 29.65 kg/m.-not a candidate for major weight loss.  CDE Consult has been initiated , exercise, and detailed carbohydrates information provided.  5) Chronic Care/Health Maintenance: -he is on ARB and Statin medications and is encouraged to initiate and continue to follow up with Ophthalmology, Dentist,  Podiatrist at least yearly or according to recommendations, and he is advised to stay away from smoking.  I have recommended yearly flu vaccine and pneumonia vaccine at least every 5 years; moderate intensity exercise for up to 150 minutes weekly; and sleep for at least 7 hours a day.  - I advised patient to maintain close follow up with Assunta Found, MD for primary care needs.     I spent  41  minutes in the care of the patient today including review of labs from CMP, Lipids, Thyroid Function, Hematology (current and previous including abstractions from other facilities); face-to-face time discussing  his blood glucose readings/logs, discussing hypoglycemia and hyperglycemia episodes and symptoms, medications doses, his options of short and long term treatment based on the latest standards of care / guidelines;  discussion about incorporating lifestyle medicine;  and documenting the encounter. Risk reduction counseling performed per USPSTF guidelines to reduce obesity and cardiovascular risk factors.     Please refer to Patient Instructions for Blood Glucose Monitoring and Insulin/Medications Dosing Guide"  in media tab for additional information. Please  also refer to " Patient Self Inventory" in the Media  tab for reviewed elements of pertinent patient history.  Lawana Pai participated in the discussions, expressed understanding, and voiced agreement with the above plans.  All questions were answered to his satisfaction. he is encouraged to contact clinic should he have any questions or concerns prior to his return visit.    Follow up plan: - Return in about 4 months (around 04/23/2023) for Diabetes F/U with A1c in office, Previsit labs, Bring meter and logs.  Ronny Bacon, [facility] [address] [place], [place] [address] Phone: [phone] Fax: [phone]  12/21/2022, 3:35 PM

## 2022-12-24 DIAGNOSIS — M79672 Pain in left foot: Secondary | ICD-10-CM | POA: Diagnosis not present

## 2022-12-24 DIAGNOSIS — E1065 Type 1 diabetes mellitus with hyperglycemia: Secondary | ICD-10-CM | POA: Diagnosis not present

## 2022-12-24 DIAGNOSIS — M722 Plantar fascial fibromatosis: Secondary | ICD-10-CM | POA: Diagnosis not present

## 2022-12-25 LAB — COMPREHENSIVE METABOLIC PANEL
ALT: 22 [IU]/L (ref 0–44)
AST: 19 [IU]/L (ref 0–40)
Albumin: 4.2 g/dL (ref 4.1–5.1)
Alkaline Phosphatase: 67 [IU]/L (ref 44–121)
BUN/Creatinine Ratio: 18 (ref 9–20)
BUN: 25 mg/dL — ABNORMAL HIGH (ref 6–24)
Bilirubin Total: <0.2 mg/dL (ref 0.0–1.2)
CO2: 22 mmol/L (ref 20–29)
Calcium: 9.5 mg/dL (ref 8.7–10.2)
Chloride: 101 mmol/L (ref 96–106)
Creatinine, Ser: 1.36 mg/dL — ABNORMAL HIGH (ref 0.76–1.27)
Globulin, Total: 2.7 g/dL (ref 1.5–4.5)
Glucose: 166 mg/dL — ABNORMAL HIGH (ref 70–99)
Potassium: 4.2 mmol/L (ref 3.5–5.2)
Sodium: 139 mmol/L (ref 134–144)
Total Protein: 6.9 g/dL (ref 6.0–8.5)
eGFR: 64 mL/min/{1.73_m2} (ref >59)

## 2023-01-06 NOTE — Progress Notes (Signed)
CARDIOLOGY CONSULT NOTE       Patient ID: Jesse Rodgers MRN: [id number] DOB/AGE: [birth year] 49 y.o.  Primary Physician: Assunta Found, MD Primary Cardiologist: Eden Emms    HPI:  47 y.o. seen for post hospital visit chest pain Seen in consult at [facility] for chest pain 08/05/21 History of IDDM, chronic back pain, depression , HTN, HLD In ER rash and headache Troponin peak 298 He was tachycardic and started on lopressor ECG with no acute changes Echo 08/06/21 no RWMA;s EF 55-60% Myovue done 08/07/21 no ischemia diaphragmatic attenuation EF 64%  Since d/c doing well Seen by Dr Margo Aye dermatology and biopsy no vasculitis  Discussed utility of calcium score to further risk stratify given IDDM  His BP/HR up today forgot to take meds Usually ok at home Use to be on Benicar but stopped due to insurance reasons. Discussed importance of beta blocker to control HR and ARB to protect kidneys  Also suggested calcium score again   ROS All other systems reviewed and negative except as noted above  Past Medical History:  Diagnosis Date   Arthritis    back    Depression    takes Zoloft daily   Diabetes mellitus without complication (HCC)    takes Toujeo and Novolog nightly;average fasting blood sugar runs 150   History of kidney stones    Hyperlipidemia    takes Simvastatin daily   Hypertension    takes Losartan daily    Family History  Problem Relation Age of Onset   Coronary artery disease Mother    Breast cancer Mother    Alzheimer's disease Father    Diabetes Sister    Multiple sclerosis Sister    Asthma Son     Social History   Socioeconomic History   Marital status: Married    Spouse name: Not on file   Number of children: Not on file   Years of education: Not on file   Highest education level: Not on file  Occupational History   Not on file  Tobacco Use   Smoking status: Former    Current packs/day: 0.00    Average packs/day: 1 pack/day for 30.0 years (30.0 ttl pk-yrs)     Types: Cigarettes    Start date: 02/07/1989    Quit date: 02/08/2019    Years since quitting: 3.9   Smokeless tobacco: Never   Tobacco comments:    Pt no longer vapes as of December 1,2023 LW  Vaping Use   Vaping status: Every Day  Substance and Sexual Activity   Alcohol use: Yes    Comment: occasional   Drug use: No   Sexual activity: Yes  Other Topics Concern   Not on file  Social History Narrative   Not on file   Social Determinants of Health   Financial Resource Strain: Not on file  Food Insecurity: Not on file  Transportation Needs: Not on file  Physical Activity: Not on file  Stress: Not on file  Social Connections: Not on file  Intimate Partner Violence: Not on file    Past Surgical History:  Procedure Laterality Date   ABDOMINAL SURGERY     APPENDECTOMY     BACK SURGERY     DENTAL SURGERY  12/10/2014   molar tooth #  14,15,16  bone biopsy  of ramus   PARTIAL COLECTOMY     TOOTH EXTRACTION N/A 12/10/2014   Procedure: EXTRACTION MOLARS #14,15,16  INTRAORAL AND EXTRAORAL INCISION AND DRAINAGE AND BONE BIOPSY OF LEFT RAMUS;  Surgeon: [name], DDS;  Location: [facility] OR;  Service: Oral Surgery;  Laterality: N/A;      Current Outpatient Medications:    albuterol (VENTOLIN HFA) 108 (90 Base) MCG/ACT inhaler, Inhale 2 puffs into the lungs every 6 (six) hours as needed., Disp: 18 g, Rfl: 5   Continuous Blood Gluc Sensor (DEXCOM G6 SENSOR) MISC, Check glucose continuously, Disp: 3 each, Rfl: 2   Continuous Blood Gluc Sensor (DEXCOM G6 SENSOR) MISC, APPLY 1 SENSOR TO SKIN TO MONITOR GLUCOSE CONTINUOUSLY AS DIRECTED. CHANGE SENSOR EVERY 10 DAYS., Disp: 3 each, Rfl: 2   Continuous Glucose Sensor (DEXCOM G6 SENSOR) MISC, Apply new sensor every 10 days as directed., Disp: 9 each, Rfl: 3   Continuous Glucose Transmitter (DEXCOM G6 TRANSMITTER) MISC, USE TO TEST BLOOD GLUCOSE 4-5 TIMES DAILY, Disp: 1 each, Rfl: 1   ENBREL SURECLICK 50 MG/ML injection, Inject 50 mg into the  skin once a week. (Patient not taking: Reported on 12/21/2022), Disp: , Rfl:    fenofibrate (TRICOR) 145 MG tablet, Take 1 tablet (145 mg total) by mouth daily., Disp: 90 tablet, Rfl: 3   furosemide (LASIX) 20 MG tablet, Take 20 mg by mouth daily as needed., Disp: , Rfl:    Glucagon, rDNA, (GLUCAGON EMERGENCY) 1 MG KIT, Inject 1 mg into the muscle once as needed., Disp: 1 kit, Rfl: 1   insulin aspart (NOVOLOG) 100 UNIT/ML injection, USE WITH OMNIPOD FOR TOTAL DAILY DOSE AROUND 100 UNITS DAILY, Disp: 80 mL, Rfl: 0   Insulin Disposable Pump (OMNIPOD 5 DEXG7G6 PODS GEN 5) MISC, Change pod every 48-72 hrs, Disp: 6 each, Rfl: 6   Insulin Disposable Pump (OMNIPOD 5 G6 INTRO, GEN 5,) KIT, Change pod every 48-72 hrs, Disp: 1 kit, Rfl: 0   metoprolol tartrate (LOPRESSOR) 25 MG tablet, Take 1 tablet (25 mg total) by mouth 2 (two) times daily., Disp: 180 tablet, Rfl: 0   Multiple Vitamin (MULTIVITAMIN WITH MINERALS) TABS tablet, Take 1 tablet by mouth daily., Disp: , Rfl:    NON FORMULARY, Taking Total Beet product daily, Disp: , Rfl:    OTEZLA 30 MG TABS, Take 1 tablet by mouth 2 (two) times daily., Disp: , Rfl:    rosuvastatin (CRESTOR) 20 MG tablet, TAKE 1 TABLET(20 MG) BY MOUTH DAILY, Disp: 90 tablet, Rfl: 3   sertraline (ZOLOFT) 100 MG tablet, Take 1 tablet (100 mg total) by mouth daily., Disp: 30 tablet, Rfl: 0   tiZANidine (ZANAFLEX) 4 MG tablet, Take 1 tablet (4 mg total) by mouth at bedtime as needed for muscle spasms. Do not take with alcohol or while driving or operating heavy machinery.  May cause drowsiness., Disp: 30 tablet, Rfl: 0    Physical Exam:  There were no vitals taken for this visit.   Affect appropriate Chronically ill male  HEENT: normal Neck supple with no adenopathy JVP normal no bruits no thyromegaly Lungs clear with no wheezing and good diaphragmatic motion Heart:  S1/S2 no murmur, no rub, gallop or click PMI normal Abdomen: benighn, BS positve, no tenderness, no  AAA no bruit.  No HSM or HJR Distal pulses intact with no bruits No edema Neuro non-focal Skin healing pigmented rash on back and shoulders  No muscular weakness   Labs:   Lab Results  Component Value Date   WBC 7.8 08/07/2021   HGB 10.5 (L) 08/07/2021   HCT 31.5 (L) 08/07/2021   MCV 88.7 08/07/2021   PLT 273 08/07/2021   No results for input(s): "NA", "  K", "CL", "CO2", "BUN", "CREATININE", "CALCIUM", "PROT", "BILITOT", "ALKPHOS", "ALT", "AST", "GLUCOSE" in the last 168 hours.  Invalid input(s): "LABALBU" No results found for: "CKTOTAL", "CKMB", "CKMBINDEX", "TROPONINI"  Lab Results  Component Value Date   CHOL 196 08/17/2022   CHOL 120 08/06/2021   CHOL 215 (H) 02/07/2021   Lab Results  Component Value Date   HDL 45 08/17/2022   HDL 40 (L) 08/06/2021   HDL 53 02/07/2021   Lab Results  Component Value Date   LDLCALC 65 08/17/2022   LDLCALC 52 08/06/2021   LDLCALC 97 02/07/2021   Lab Results  Component Value Date   TRIG 561 (HH) 08/17/2022   TRIG 138 08/06/2021   TRIG 393 (H) 02/07/2021   Lab Results  Component Value Date   CHOLHDL 4.4 08/17/2022   CHOLHDL 3.0 08/06/2021   CHOLHDL 4.1 02/07/2021   No results found for: "LDLDIRECT"    Radiology: No results found.  EKG: NSR no acute changes 08/06/21  01/11/2023 ST rate 112 RAD insignificant Q waves in inferior leads    ASSESSMENT AND PLAN:   Chest pain : atypical multiple risk factors non ischemic myovue 08/07/21 observe and Rx risk factors Calcium score to determine degree of f/u and stress testing in future given poorly controlled IDDM HTN:  Missed lopressor today Compliance discussed Start Benicar /BMET today   HLD:  continue statin labs with primary LDL 65 08/17/22 Triglycerides elevated due to DM and poor diet  DM:  slightly better control A1c 6.8 12/21/22 has insulin pump Needs to see dietician and improve diet  Rash:  ? From insect bite completed course of Bactrim and Keflex ? Erythema multiforme from  Bactrim f/u with dermatology Rx with doxycycline on d/c Resolved   Calcium score  BMET Benicar 5 mg   F/U with cardiology  in a year  Signed: Charlton Haws 01/06/2023, 12:02 PM

## 2023-01-11 ENCOUNTER — Encounter: Payer: Self-pay | Admitting: Cardiovascular Disease

## 2023-01-11 ENCOUNTER — Ambulatory Visit: Payer: BC Managed Care – PPO | Attending: Cardiovascular Disease | Admitting: Cardiovascular Disease

## 2023-01-11 ENCOUNTER — Other Ambulatory Visit (HOSPITAL_COMMUNITY)
Admission: RE | Admit: 2023-01-11 | Discharge: 2023-01-11 | Disposition: A | Discharge status: Home / Self Care | Payer: BC Managed Care – PPO | Source: Ambulatory Visit | Attending: Cardiovascular Disease | Admitting: Cardiovascular Disease

## 2023-01-11 VITALS — BP 150/80 | HR 112 | Ht 75.0 in | Wt 228.8 lb

## 2023-01-11 DIAGNOSIS — I2510 Atherosclerotic heart disease of native coronary artery without angina pectoris: Secondary | ICD-10-CM | POA: Diagnosis not present

## 2023-01-11 DIAGNOSIS — E782 Mixed hyperlipidemia: Secondary | ICD-10-CM

## 2023-01-11 DIAGNOSIS — I10 Essential (primary) hypertension: Secondary | ICD-10-CM

## 2023-01-11 LAB — BASIC METABOLIC PANEL
Anion gap: 10 (ref 5–15)
BUN: 20 mg/dL (ref 6–20)
CO2: 23 mmol/L (ref 22–32)
Calcium: 9.3 mg/dL (ref 8.9–10.3)
Chloride: 100 mmol/L (ref 98–111)
Creatinine, Ser: 1.14 mg/dL (ref 0.61–1.24)
GFR, Estimated: >60 mL/min (ref >60)
Glucose, Bld: 155 mg/dL — ABNORMAL HIGH (ref 70–99)
Potassium: 3.9 mmol/L (ref 3.5–5.1)
Sodium: 133 mmol/L — ABNORMAL LOW (ref 135–145)

## 2023-01-11 MED ORDER — OLMESARTAN MEDOXOMIL 5 MG PO TABS: 5.0000 mg | ORAL_TABLET | Freq: Every day | ORAL | 11 refills | Status: DC

## 2023-01-11 NOTE — Patient Instructions (Signed)
Medication Instructions:   Start Taking Benicar 5 mg Daily   *If you need a refill on your cardiac medications before your next appointment, please call your pharmacy*   Lab Work: Your physician recommends that you return for lab work in: Today   If you have labs (blood work) drawn today and your tests are completely normal, you will receive your results only by: MyChart Message (if you have MyChart) OR A paper copy in the mail If you have any lab test that is abnormal or we need to change your treatment, we will call you to review the results.   Testing/Procedures: Calcium Score    Follow-Up: At [facility], you and your health needs are our priority.  As part of our continuing mission to provide you with exceptional heart care, we have created designated Provider Care Teams.  These Care Teams include your primary Cardiologist (physician) and Advanced Practice Providers (APPs -  Physician Assistants and Nurse Practitioners) who all work together to provide you with the care you need, when you need it.  We recommend signing up for the patient portal called "MyChart".  Sign up information is provided on this After Visit Summary.  MyChart is used to connect with patients for Virtual Visits (Telemedicine).  Patients are able to view lab/test results, encounter notes, upcoming appointments, etc.  Non-urgent messages can be sent to your provider as well.   To learn more about what you can do with MyChart, go to [email or web address].    Your next appointment:   1 year(s)  Provider:   You may see Charlton Haws, MD or one of the following Advanced Practice Providers on your designated Care Team:   Randall An, PA-C  Jacolyn Reedy, PA-C     Other Instructions Thank you for choosing [facility]!

## 2023-01-13 ENCOUNTER — Ambulatory Visit: Payer: BC Managed Care – PPO

## 2023-01-13 ENCOUNTER — Ambulatory Visit
Admission: EM | Admit: 2023-01-13 | Discharge: 2023-01-13 | Disposition: A | Discharge status: Home / Self Care | Payer: BC Managed Care – PPO | Attending: Nurse Practitioner | Admitting: Nurse Practitioner

## 2023-01-13 ENCOUNTER — Encounter: Payer: Self-pay | Admitting: *Deleted

## 2023-01-13 DIAGNOSIS — R0602 Shortness of breath: Secondary | ICD-10-CM | POA: Diagnosis not present

## 2023-01-13 DIAGNOSIS — R0781 Pleurodynia: Secondary | ICD-10-CM | POA: Diagnosis not present

## 2023-01-13 MED ORDER — TIZANIDINE HCL 4 MG PO TABS: 4.0000 mg | ORAL_TABLET | Freq: Three times a day (TID) | ORAL | 0 refills | Status: DC | PRN

## 2023-01-13 NOTE — Discharge Instructions (Addendum)
You likely have bruised ribs which are causing the pain.  I will contact you later today if the x-ray comes back abnormal.  This can take up to 6 weeks to fully improve.  To help with pain, you can take Tylenol 500 to 1000 mg every 6 hours as needed.  You can also take the tizanidine as needed for muscular pain.  Seek care if you develop fever, worsening cough and congestion.

## 2023-01-13 NOTE — ED Provider Notes (Signed)
[facility]    CSN: [id number] Arrival date & time: 01/13/23  0920      History   Chief Complaint Chief Complaint  Patient presents with   Fall    HPI Jesse Rodgers is a 47 y.o. male.   Patient presents today with left side pain that began after he tripped over his dog on Sunday and landed on the side of his deck.  He reports since that time, he has had severe pain in the left side with any movement, deep breathing, talking, or laughing.  He denies fevers, but does have a little bit of a cough.  No chest pain or shortness of breath, to bowel movements, coughing up blood.  Has been taking Tylenol for symptoms with minimal improvement.    Past Medical History:  Diagnosis Date   Arthritis    back    Depression    takes Zoloft daily   Diabetes mellitus without complication (HCC)    takes Toujeo and Novolog nightly;average fasting blood sugar runs 150   History of kidney stones    Hyperlipidemia    takes Simvastatin daily   Hypertension    takes Losartan daily    Patient Active Problem List   Diagnosis Date Noted   DOE (dyspnea on exertion) 02/13/2022   OSA (obstructive sleep apnea) 02/13/2022   Chest pain 08/05/2021   Elevated troponin 08/05/2021   Diabetes mellitus type 1 (HCC) 08/05/2021   Rash 08/05/2021   Elevated LFTs 08/05/2021   Uncontrolled type 1 diabetes mellitus with hyperglycemia (HCC) 09/28/2017   Tobacco abuse 09/28/2017   Myositis 10/24/2014   Odontogenic infection of jaw 10/23/2014   Dental infection 10/23/2014   Essential hypertension, benign 10/23/2014   Mixed hyperlipidemia 10/23/2014   Depression 10/23/2014   Dehydration 10/23/2014   DIABETES 05/15/2008   ANKLE PAIN, RIGHT 05/15/2008   Sprain of ankle 05/15/2008    Past Surgical History:  Procedure Laterality Date   ABDOMINAL SURGERY     APPENDECTOMY     BACK SURGERY     DENTAL SURGERY  12/10/2014   molar tooth #  14,15,16  bone biopsy  of ramus   PARTIAL COLECTOMY      TOOTH EXTRACTION N/A 12/10/2014   Procedure: EXTRACTION MOLARS #14,15,16  INTRAORAL AND EXTRAORAL INCISION AND DRAINAGE AND BONE BIOPSY OF LEFT RAMUS;  Surgeon: [name], DDS;  Location: [facility] OR;  Service: Oral Surgery;  Laterality: N/A;       Home Medications    Prior to Admission medications   Medication Sig Start Date End Date Taking? Authorizing Provider  fenofibrate (TRICOR) 145 MG tablet Take 1 tablet (145 mg total) by mouth daily. 08/20/22  Yes Reardon, Alphonzo Lemmings J, NP  insulin aspart (NOVOLOG) 100 UNIT/ML injection USE WITH OMNIPOD FOR TOTAL DAILY DOSE AROUND 100 UNITS DAILY 12/21/22  Yes Dani Gobble, NP  metoprolol tartrate (LOPRESSOR) 25 MG tablet Take 1 tablet (25 mg total) by mouth 2 (two) times daily. 10/13/22  Yes Wendall Stade, MD  Multiple Vitamin (MULTIVITAMIN WITH MINERALS) TABS tablet Take 1 tablet by mouth daily.   Yes [provider]  OTEZLA 30 MG TABS Take 1 tablet by mouth 2 (two) times daily.   Yes [provider]  rosuvastatin (CRESTOR) 20 MG tablet TAKE 1 TABLET(20 MG) BY MOUTH DAILY 08/20/22  Yes Dani Gobble, NP  sertraline (ZOLOFT) 100 MG tablet Take 1 tablet (100 mg total) by mouth daily. 08/07/21  Yes Erick Blinks, MD  tiZANidine (  ZANAFLEX) 4 MG tablet Take 1 tablet (4 mg total) by mouth every 8 (eight) hours as needed for muscle spasms. Do not take with alcohol or while driving or operating heavy machinery.  May cause drowsiness. 01/13/23  Yes Valentino Nose, NP  Continuous Blood Gluc Sensor (DEXCOM G6 SENSOR) MISC Check glucose continuously 04/20/22   Dani Gobble, NP  Continuous Blood Gluc Sensor (DEXCOM G6 SENSOR) MISC APPLY 1 SENSOR TO SKIN TO MONITOR GLUCOSE CONTINUOUSLY AS DIRECTED. CHANGE SENSOR EVERY 10 DAYS. 05/26/22   Dani Gobble, NP  Continuous Glucose Sensor (DEXCOM G6 SENSOR) MISC Apply new sensor every 10 days as directed. 12/21/22   Dani Gobble, NP  Continuous Glucose Transmitter  (DEXCOM G6 TRANSMITTER) MISC USE TO TEST BLOOD GLUCOSE 4-5 TIMES DAILY 12/21/22   Dani Gobble, NP  furosemide (LASIX) 20 MG tablet Take 20 mg by mouth daily as needed. 06/09/19   [provider]  Glucagon, rDNA, (GLUCAGON EMERGENCY) 1 MG KIT Inject 1 mg into the muscle once as needed. 08/25/22   Dani Gobble, NP  Insulin Disposable Pump (OMNIPOD 5 DEXG7G6 PODS GEN 5) MISC Change pod every 48-72 hrs 12/21/22   Dani Gobble, NP  Insulin Disposable Pump (OMNIPOD 5 G6 INTRO, GEN 5,) KIT Change pod every 48-72 hrs 06/12/21   Dani Gobble, NP  NON FORMULARY Taking Total Beet product daily    [provider]  olmesartan (BENICAR) 5 MG tablet Take 1 tablet (5 mg total) by mouth daily. 01/11/23   Wendall Stade, MD    Family History Family History  Problem Relation Age of Onset   Coronary artery disease Mother    Breast cancer Mother    Alzheimer's disease Father    Diabetes Sister    Multiple sclerosis Sister    Asthma Son     Social History Social History   Tobacco Use   Smoking status: Former    Current packs/day: 0.00    Average packs/day: 1 pack/day for 30.0 years (30.0 ttl pk-yrs)    Types: Cigarettes    Start date: 02/07/1989    Quit date: 02/08/2019    Years since quitting: 3.9   Smokeless tobacco: Never   Tobacco comments:    Pt no longer vapes as of December 1,2023 LW  Vaping Use   Vaping status: Every Day  Substance Use Topics   Alcohol use: Yes    Comment: occasional   Drug use: No     Allergies   Bactrim [sulfamethoxazole-trimethoprim]   Review of Systems Review of Systems Per HPI  Physical Exam Triage Vital Signs ED Triage Vitals  Encounter Vitals Group     BP 01/13/23 0935 (!) 142/84     Systolic BP Percentile --      Diastolic BP Percentile --      Pulse Rate 01/13/23 0935 89     Resp 01/13/23 0935 16     Temp 01/13/23 0935 98.4 F (36.9 C)     Temp Source 01/13/23 0935 Oral     SpO2 01/13/23 0935 98 %      Weight --      Height --      Head Circumference --      Peak Flow --      Pain Score 01/13/23 0932 9     Pain Loc --      Pain Education --      Exclude from Growth Chart --    No data found.  Updated Vital Signs BP (!) 142/84 (BP Location: Right Arm)   Pulse 89   Temp 98.4 F (36.9 C) (Oral)   Resp 16   SpO2 98%   Visual Acuity Right Eye Distance:   Left Eye Distance:   Bilateral Distance:    Right Eye Near:   Left Eye Near:    Bilateral Near:     Physical Exam Vitals and nursing note reviewed.  Constitutional:      General: He is not in acute distress.    Appearance: Normal appearance. He is not toxic-appearing.  HENT:     Mouth/Throat:     Mouth: Mucous membranes are moist.     Pharynx: Oropharynx is clear.  Pulmonary:     Effort: Pulmonary effort is normal. No respiratory distress.  Chest:       Comments: Patient exquisitely tender to palpation to left eighth and ninth ribs laterally.  No bruising.  There is a large area of tenderness. Skin:    General: Skin is warm and dry.     Capillary Refill: Capillary refill takes less than 2 seconds.     Coloration: Skin is not jaundiced or pale.     Findings: No erythema.  Neurological:     Mental Status: He is alert and oriented to person, place, and time.  Psychiatric:        Behavior: Behavior is cooperative.      UC Treatments / Results  Labs (all labs ordered are listed, but only abnormal results are displayed) Labs Reviewed - No data to display  EKG   Radiology DG Ribs Unilateral W/Chest Left  Result Date: 01/13/2023 CLINICAL DATA:  Left-sided pain after fall, shortness of breath EXAM: LEFT RIBS AND CHEST - 4 VIEW COMPARISON:  None Available. FINDINGS: No displaced fracture or other bone lesions are seen involving the ribs. No pneumothorax or pleural effusion. No focal pulmonary opacity. Normal cardiac and mediastinal contours. IMPRESSION: No displaced rib fracture. Please note that radiographs can  be insensitive to nondisplaced rib fractures. Electronically Signed   By: Wiliam Ke M.D.   On: 01/13/2023 11:25    Procedures Procedures (including critical care time)  Medications Ordered in UC Medications - No data to display  Initial Impression / Assessment and Plan / UC Course  I have reviewed the triage vital signs and the nursing notes.  Pertinent labs & imaging results that were available during my care of the patient were reviewed by me and considered in my medical decision making (see chart for details).   Patient is well-appearing, normotensive, afebrile, not tachycardic, not tachypneic, oxygenating well on room air.    1. Rib pain on left side X-ray imaging is negative for acute rib fracture Vitals and exam are stable Recommended Tylenol, tizanidine as needed, lidocaine patches, heat/ice Also encourage deep breathing exercises to prevent pneumonia Strict ER precautions discussed with patient  The patient was given the opportunity to ask questions.  All questions answered to their satisfaction.  The patient is in agreement to this plan.   Final Clinical Impressions(s) / UC Diagnoses   Final diagnoses:  Rib pain on left side     Discharge Instructions      You likely have bruised ribs which are causing the pain.  I will contact you later today if the x-ray comes back abnormal.  This can take up to 6 weeks to fully improve.  To help with pain, you can take Tylenol 500 to 1000 mg every 6 hours as  needed.  You can also take the tizanidine as needed for muscular pain.  Seek care if you develop fever, worsening cough and congestion.      ED Prescriptions     Medication Sig Dispense Auth. Provider   tiZANidine (ZANAFLEX) 4 MG tablet Take 1 tablet (4 mg total) by mouth every 8 (eight) hours as needed for muscle spasms. Do not take with alcohol or while driving or operating heavy machinery.  May cause drowsiness. 30 tablet Valentino Nose, NP      PDMP not  reviewed this encounter.   Valentino Nose, NP 01/13/23 1128

## 2023-01-13 NOTE — ED Triage Notes (Signed)
Pt states he tripped over his dog on Sunday morning. He is having left sided pain that has increased since then. He states he is having trouble breathing and it hurts more when he takes deep breaths.He has been taking tylneol 100mg , last dose was 7am this morning.

## 2023-01-18 ENCOUNTER — Ambulatory Visit (HOSPITAL_COMMUNITY)
Admission: RE | Admit: 2023-01-18 | Discharge: 2023-01-18 | Disposition: A | Discharge status: Home / Self Care | Payer: BC Managed Care – PPO | Source: Ambulatory Visit | Attending: Cardiovascular Disease | Admitting: Cardiovascular Disease

## 2023-01-18 DIAGNOSIS — I2510 Atherosclerotic heart disease of native coronary artery without angina pectoris: Secondary | ICD-10-CM | POA: Insufficient documentation

## 2023-01-18 DIAGNOSIS — Z136 Encounter for screening for cardiovascular disorders: Secondary | ICD-10-CM | POA: Diagnosis not present

## 2023-01-24 ENCOUNTER — Other Ambulatory Visit: Payer: Self-pay | Admitting: Cardiovascular Disease

## 2023-01-24 ENCOUNTER — Other Ambulatory Visit: Payer: Self-pay | Admitting: Nurse Practitioner

## 2023-01-26 NOTE — Telephone Encounter (Signed)
   Notes to clinic: Provider no longer at practice- non delegated Rx, non delegated Rx  Requested Prescriptions  Pending Prescriptions Disp Refills   tiZANidine (ZANAFLEX) 4 MG tablet [Pharmacy Med Name: TIZANIDINE 4MG  TABLETS] 30 tablet 0    Sig: TAKE 1 TABLET BY MOUTH EVERY 8 HOURS AS NEEDED FOR MUSCLE SPASMS. DO NOT TAKE WITH ALCOHOL, WHILE DRIVING OR OPERATING HEAVY MACHINERY     Not Delegated - Cardiovascular:  Alpha-2 Agonists - tizanidine Failed - 01/24/2023  4:15 PM      Failed - This refill cannot be delegated      Failed - Valid encounter within last 6 months    Recent Outpatient Visits   None               Requested Prescriptions  Pending Prescriptions Disp Refills   tiZANidine (ZANAFLEX) 4 MG tablet [Pharmacy Med Name: TIZANIDINE 4MG  TABLETS] 30 tablet 0    Sig: TAKE 1 TABLET BY MOUTH EVERY 8 HOURS AS NEEDED FOR MUSCLE SPASMS. DO NOT TAKE WITH ALCOHOL, WHILE DRIVING OR OPERATING HEAVY MACHINERY     Not Delegated - Cardiovascular:  Alpha-2 Agonists - tizanidine Failed - 01/24/2023  4:15 PM      Failed - This refill cannot be delegated      Failed - Valid encounter within last 6 months    Recent Outpatient Visits   None

## 2023-03-04 DIAGNOSIS — L4050 Arthropathic psoriasis, unspecified: Secondary | ICD-10-CM | POA: Diagnosis not present

## 2023-03-04 DIAGNOSIS — M2560 Stiffness of unspecified joint, not elsewhere classified: Secondary | ICD-10-CM | POA: Diagnosis not present

## 2023-03-04 DIAGNOSIS — M1991 Primary osteoarthritis, unspecified site: Secondary | ICD-10-CM | POA: Diagnosis not present

## 2023-03-04 DIAGNOSIS — M25561 Pain in right knee: Secondary | ICD-10-CM | POA: Diagnosis not present

## 2023-03-21 ENCOUNTER — Other Ambulatory Visit: Payer: Self-pay | Admitting: Nurse Practitioner

## 2023-04-05 DIAGNOSIS — M65342 Trigger finger, left ring finger: Secondary | ICD-10-CM | POA: Diagnosis not present

## 2023-04-05 DIAGNOSIS — M79642 Pain in left hand: Secondary | ICD-10-CM | POA: Diagnosis not present

## 2023-04-05 DIAGNOSIS — M65332 Trigger finger, left middle finger: Secondary | ICD-10-CM | POA: Diagnosis not present

## 2023-04-14 ENCOUNTER — Telehealth: Payer: BC Managed Care – PPO | Admitting: Physician Assistant

## 2023-04-14 DIAGNOSIS — B9689 Other specified bacterial agents as the cause of diseases classified elsewhere: Secondary | ICD-10-CM | POA: Diagnosis not present

## 2023-04-14 DIAGNOSIS — J069 Acute upper respiratory infection, unspecified: Secondary | ICD-10-CM

## 2023-04-14 MED ORDER — BENZONATATE 100 MG PO CAPS: 100.0000 mg | ORAL_CAPSULE | Freq: Three times a day (TID) | ORAL | 0 refills | Status: DC | PRN

## 2023-04-14 MED ORDER — FLUTICASONE PROPIONATE 50 MCG/ACT NA SUSP: 2.0000 | Freq: Every day | NASAL | 0 refills | Status: AC

## 2023-04-14 MED ORDER — AZITHROMYCIN 250 MG PO TABS: ORAL_TABLET | ORAL | 0 refills | Status: AC

## 2023-04-14 MED ORDER — PROMETHAZINE-DM 6.25-15 MG/5ML PO SYRP: 5.0000 mL | ORAL_SOLUTION | Freq: Every day | ORAL | 0 refills | Status: DC

## 2023-04-14 NOTE — Progress Notes (Signed)
Virtual Visit Consent   Jesse Rodgers, you are scheduled for a virtual visit with a [facility] provider today. Just as with appointments in the office, your consent must be obtained to participate. Your consent will be active for this visit and any virtual visit you may have with one of our providers in the next 365 days. If you have a MyChart account, a copy of this consent can be sent to you electronically.  As this is a virtual visit, video technology does not allow for your provider to perform a traditional examination. This may limit your provider's ability to fully assess your condition. If your provider identifies any concerns that need to be evaluated in person or the need to arrange testing (such as labs, EKG, etc.), we will make arrangements to do so. Although advances in technology are sophisticated, we cannot ensure that it will always work on either your end or our end. If the connection with a video visit is poor, the visit may have to be switched to a telephone visit. With either a video or telephone visit, we are not always able to ensure that we have a secure connection.  By engaging in this virtual visit, you consent to the provision of healthcare and authorize for your insurance to be billed (if applicable) for the services provided during this visit. Depending on your insurance coverage, you may receive a charge related to this service.  I need to obtain your verbal consent now. Are you willing to proceed with your visit today? Jesse Rodgers has provided verbal consent on 04/14/2023 for a virtual visit (video or telephone). Margaretann Loveless, PA-C  Date: 04/14/2023 8:25 AM   Virtual Visit via Video Note   I, Margaretann Loveless, connected with  Jesse Rodgers  ([id number], [birth year]) on 04/14/23 at  8:15 AM EST by a video-enabled telemedicine application and verified that I am speaking with the correct person using two identifiers.  Location: Patient: Virtual Visit  Location Patient: Home Provider: Virtual Visit Location Provider: Home Office   I discussed the limitations of evaluation and management by telemedicine and the availability of in person appointments. The patient expressed understanding and agreed to proceed.    History of Present Illness: Jesse Rodgers is a 48 y.o. who identifies as a male who was assigned male at birth, and is being seen today for URI symptoms.  HPI: URI  This is a new problem. The current episode started in the past 7 days. The problem has been gradually worsening. There has been no fever. Associated symptoms include chest pain (burns when coughing), congestion, coughing (dry), headaches, rhinorrhea (and post nasal drainage), sinus pain and a sore throat. Pertinent negatives include no diarrhea, ear pain, nausea, plugged ear sensation, vomiting or wheezing. Treatments tried: alka seltzer plus, vicks vapor rub. The treatment provided no relief.  Sick contact with wife, she improved with antibiotic  Is on immunosuppression medications for Arthritis and is T1DM  Problems:  Patient Active Problem List   Diagnosis Date Noted   DOE (dyspnea on exertion) 02/13/2022   OSA (obstructive sleep apnea) 02/13/2022   Chest pain 08/05/2021   Elevated troponin 08/05/2021   Diabetes mellitus type 1 (HCC) 08/05/2021   Rash 08/05/2021   Elevated LFTs 08/05/2021   Uncontrolled type 1 diabetes mellitus with hyperglycemia (HCC) 09/28/2017   Tobacco abuse 09/28/2017   Myositis 10/24/2014   Odontogenic infection of jaw 10/23/2014   Dental infection 10/23/2014   Essential hypertension, benign 10/23/2014  Mixed hyperlipidemia 10/23/2014   Depression 10/23/2014   Dehydration 10/23/2014   DIABETES 05/15/2008   ANKLE PAIN, RIGHT 05/15/2008   Sprain of ankle 05/15/2008    Allergies:  Allergies  Allergen Reactions   Bactrim [Sulfamethoxazole-Trimethoprim] Rash   Medications:  Current Outpatient Medications:    azithromycin  (ZITHROMAX) 250 MG tablet, Take 2 tablets on day 1, then 1 tablet daily on days 2 through 5, Disp: 6 tablet, Rfl: 0   benzonatate (TESSALON) 100 MG capsule, Take 1-2 capsules (100-200 mg total) by mouth 3 (three) times daily as needed., Disp: 30 capsule, Rfl: 0   fluticasone (FLONASE) 50 MCG/ACT nasal spray, Place 2 sprays into both nostrils daily., Disp: 16 g, Rfl: 0   promethazine-dextromethorphan (PROMETHAZINE-DM) 6.25-15 MG/5ML syrup, Take 5 mLs by mouth at bedtime., Disp: 118 mL, Rfl: 0   Continuous Blood Gluc Sensor (DEXCOM G6 SENSOR) MISC, Check glucose continuously, Disp: 3 each, Rfl: 2   Continuous Blood Gluc Sensor (DEXCOM G6 SENSOR) MISC, APPLY 1 SENSOR TO SKIN TO MONITOR GLUCOSE CONTINUOUSLY AS DIRECTED. CHANGE SENSOR EVERY 10 DAYS., Disp: 3 each, Rfl: 2   Continuous Glucose Sensor (DEXCOM G6 SENSOR) MISC, Apply new sensor every 10 days as directed., Disp: 9 each, Rfl: 3   Continuous Glucose Transmitter (DEXCOM G6 TRANSMITTER) MISC, USE TO TEST BLOOD GLUCOSE 4-5 TIMES DAILY, Disp: 1 each, Rfl: 1   fenofibrate (TRICOR) 145 MG tablet, Take 1 tablet (145 mg total) by mouth daily., Disp: 90 tablet, Rfl: 3   furosemide (LASIX) 20 MG tablet, Take 20 mg by mouth daily as needed., Disp: , Rfl:    Glucagon, rDNA, (GLUCAGON EMERGENCY) 1 MG KIT, Inject 1 mg into the muscle once as needed., Disp: 1 kit, Rfl: 1   Insulin Disposable Pump (OMNIPOD 5 DEXG7G6 PODS GEN 5) MISC, Change pod every 48-72 hrs, Disp: 6 each, Rfl: 6   Insulin Disposable Pump (OMNIPOD 5 G6 INTRO, GEN 5,) KIT, Change pod every 48-72 hrs, Disp: 1 kit, Rfl: 0   metoprolol tartrate (LOPRESSOR) 25 MG tablet, TAKE 1 TABLET(25 MG) BY MOUTH TWICE DAILY, Disp: 180 tablet, Rfl: 3   Multiple Vitamin (MULTIVITAMIN WITH MINERALS) TABS tablet, Take 1 tablet by mouth daily., Disp: , Rfl:    NON FORMULARY, Taking Total Beet product daily, Disp: , Rfl:    NOVOLOG 100 UNIT/ML injection, USE WITH OMNIPOD FOR TOTAL DAILY DOSE AROUND 100 UNITS, Disp:  80 mL, Rfl: 0   olmesartan (BENICAR) 5 MG tablet, Take 1 tablet (5 mg total) by mouth daily., Disp: 30 tablet, Rfl: 11   OTEZLA 30 MG TABS, Take 1 tablet by mouth 2 (two) times daily., Disp: , Rfl:    rosuvastatin (CRESTOR) 20 MG tablet, TAKE 1 TABLET(20 MG) BY MOUTH DAILY, Disp: 90 tablet, Rfl: 3   sertraline (ZOLOFT) 100 MG tablet, Take 1 tablet (100 mg total) by mouth daily., Disp: 30 tablet, Rfl: 0   tiZANidine (ZANAFLEX) 4 MG tablet, Take 1 tablet (4 mg total) by mouth every 8 (eight) hours as needed for muscle spasms. Do not take with alcohol or while driving or operating heavy machinery.  May cause drowsiness., Disp: 30 tablet, Rfl: 0  Observations/Objective: Patient is well-developed, well-nourished in no acute distress.  Resting comfortably at home.  Head is normocephalic, atraumatic.  No labored breathing.  Speech is clear and coherent with logical content.  Patient is alert and oriented at baseline.    Assessment and Plan: 1. Bacterial upper respiratory infection (Primary) - azithromycin (ZITHROMAX) 250 MG  tablet; Take 2 tablets on day 1, then 1 tablet daily on days 2 through 5  Dispense: 6 tablet; Refill: 0 - fluticasone (FLONASE) 50 MCG/ACT nasal spray; Place 2 sprays into both nostrils daily.  Dispense: 16 g; Refill: 0 - benzonatate (TESSALON) 100 MG capsule; Take 1-2 capsules (100-200 mg total) by mouth 3 (three) times daily as needed.  Dispense: 30 capsule; Refill: 0 - promethazine-dextromethorphan (PROMETHAZINE-DM) 6.25-15 MG/5ML syrup; Take 5 mLs by mouth at bedtime.  Dispense: 118 mL; Refill: 0  - Worsening over a week despite OTC medications - Will treat with Z-pack, Fluticasone, Promethazine DM and tessalon perles - Can continue Mucinex  - Push fluids.  - Rest.  - Steam and humidifier can help - Seek in person evaluation if worsening or symptoms fail to improve    Follow Up Instructions: I discussed the assessment and treatment plan with the patient. The patient  was provided an opportunity to ask questions and all were answered. The patient agreed with the plan and demonstrated an understanding of the instructions.  A copy of instructions were sent to the patient via MyChart unless otherwise noted below.    The patient was advised to call back or seek an in-person evaluation if the symptoms worsen or if the condition fails to improve as anticipated.    Margaretann Loveless, PA-C

## 2023-04-14 NOTE — Patient Instructions (Signed)
Lawana Pai, thank you for joining Margaretann Loveless, PA-C for today's virtual visit.  While this provider is not your primary care provider (PCP), if your PCP is located in our provider database this encounter information will be shared with them immediately following your visit.   A [facility] MyChart account gives you access to today's visit and all your visits, tests, and labs performed at [facility] " click here if you don't have a [facility] MyChart account or go to [email or web address]  Consent: (Patient) Lawana Pai provided verbal consent for this virtual visit at the beginning of the encounter.  Current Medications:  Current Outpatient Medications:    azithromycin (ZITHROMAX) 250 MG tablet, Take 2 tablets on day 1, then 1 tablet daily on days 2 through 5, Disp: 6 tablet, Rfl: 0   benzonatate (TESSALON) 100 MG capsule, Take 1-2 capsules (100-200 mg total) by mouth 3 (three) times daily as needed., Disp: 30 capsule, Rfl: 0   fluticasone (FLONASE) 50 MCG/ACT nasal spray, Place 2 sprays into both nostrils daily., Disp: 16 g, Rfl: 0   promethazine-dextromethorphan (PROMETHAZINE-DM) 6.25-15 MG/5ML syrup, Take 5 mLs by mouth at bedtime., Disp: 118 mL, Rfl: 0   Continuous Blood Gluc Sensor (DEXCOM G6 SENSOR) MISC, Check glucose continuously, Disp: 3 each, Rfl: 2   Continuous Blood Gluc Sensor (DEXCOM G6 SENSOR) MISC, APPLY 1 SENSOR TO SKIN TO MONITOR GLUCOSE CONTINUOUSLY AS DIRECTED. CHANGE SENSOR EVERY 10 DAYS., Disp: 3 each, Rfl: 2   Continuous Glucose Sensor (DEXCOM G6 SENSOR) MISC, Apply new sensor every 10 days as directed., Disp: 9 each, Rfl: 3   Continuous Glucose Transmitter (DEXCOM G6 TRANSMITTER) MISC, USE TO TEST BLOOD GLUCOSE 4-5 TIMES DAILY, Disp: 1 each, Rfl: 1   fenofibrate (TRICOR) 145 MG tablet, Take 1 tablet (145 mg total) by mouth daily., Disp: 90 tablet, Rfl: 3   furosemide (LASIX) 20 MG tablet, Take 20 mg by mouth daily as needed., Disp: ,  Rfl:    Glucagon, rDNA, (GLUCAGON EMERGENCY) 1 MG KIT, Inject 1 mg into the muscle once as needed., Disp: 1 kit, Rfl: 1   Insulin Disposable Pump (OMNIPOD 5 DEXG7G6 PODS GEN 5) MISC, Change pod every 48-72 hrs, Disp: 6 each, Rfl: 6   Insulin Disposable Pump (OMNIPOD 5 G6 INTRO, GEN 5,) KIT, Change pod every 48-72 hrs, Disp: 1 kit, Rfl: 0   metoprolol tartrate (LOPRESSOR) 25 MG tablet, TAKE 1 TABLET(25 MG) BY MOUTH TWICE DAILY, Disp: 180 tablet, Rfl: 3   Multiple Vitamin (MULTIVITAMIN WITH MINERALS) TABS tablet, Take 1 tablet by mouth daily., Disp: , Rfl:    NON FORMULARY, Taking Total Beet product daily, Disp: , Rfl:    NOVOLOG 100 UNIT/ML injection, USE WITH OMNIPOD FOR TOTAL DAILY DOSE AROUND 100 UNITS, Disp: 80 mL, Rfl: 0   olmesartan (BENICAR) 5 MG tablet, Take 1 tablet (5 mg total) by mouth daily., Disp: 30 tablet, Rfl: 11   OTEZLA 30 MG TABS, Take 1 tablet by mouth 2 (two) times daily., Disp: , Rfl:    rosuvastatin (CRESTOR) 20 MG tablet, TAKE 1 TABLET(20 MG) BY MOUTH DAILY, Disp: 90 tablet, Rfl: 3   sertraline (ZOLOFT) 100 MG tablet, Take 1 tablet (100 mg total) by mouth daily., Disp: 30 tablet, Rfl: 0   tiZANidine (ZANAFLEX) 4 MG tablet, Take 1 tablet (4 mg total) by mouth every 8 (eight) hours as needed for muscle spasms. Do not take with alcohol or while driving or operating heavy machinery.  May cause  drowsiness., Disp: 30 tablet, Rfl: 0   Medications ordered in this encounter:  Meds ordered this encounter  Medications   azithromycin (ZITHROMAX) 250 MG tablet    Sig: Take 2 tablets on day 1, then 1 tablet daily on days 2 through 5    Dispense:  6 tablet    Refill:  0    Supervising Provider:   Merrilee Jansky [[id number]]   fluticasone (FLONASE) 50 MCG/ACT nasal spray    Sig: Place 2 sprays into both nostrils daily.    Dispense:  16 g    Refill:  0    Supervising Provider:   Merrilee Jansky [[id number]]   benzonatate (TESSALON) 100 MG capsule    Sig: Take 1-2 capsules (100-200 mg  total) by mouth 3 (three) times daily as needed.    Dispense:  30 capsule    Refill:  0    Supervising Provider:   Merrilee Jansky [[id number]]   promethazine-dextromethorphan (PROMETHAZINE-DM) 6.25-15 MG/5ML syrup    Sig: Take 5 mLs by mouth at bedtime.    Dispense:  118 mL    Refill:  0    Supervising Provider:   Merrilee Jansky [[id number]]     *If you need refills on other medications prior to your next appointment, please contact your pharmacy*  Follow-Up: Call back or seek an in-person evaluation if the symptoms worsen or if the condition fails to improve as anticipated.  [facility] [phone]  Other Instructions Upper Respiratory Infection, Adult An upper respiratory infection (URI) is a common viral infection of the nose, throat, and upper air passages that lead to the lungs. The most common type of URI is the common cold. URIs usually get better on their own, without medical treatment. What are the causes? A URI is caused by a virus. You may catch a virus by: Breathing in droplets from an infected person's cough or sneeze. Touching something that has been exposed to the virus (is contaminated) and then touching your mouth, nose, or eyes. What increases the risk? You are more likely to get a URI if: You are very young or very old. You have close contact with others, such as at work, school, or a health care facility. You smoke. You have long-term (chronic) heart or lung disease. You have a weakened disease-fighting system (immune system). You have nasal allergies or asthma. You are experiencing a lot of stress. You have poor nutrition. What are the signs or symptoms? A URI usually involves some of the following symptoms: Runny or stuffy (congested) nose. Cough. Sneezing. Sore throat. Headache. Fatigue. Fever. Loss of appetite. Pain in your forehead, behind your eyes, and over your cheekbones (sinus pain). Muscle aches. Redness or irritation of  the eyes. Pressure in the ears or face. How is this diagnosed? This condition may be diagnosed based on your medical history and symptoms, and a physical exam. Your health care provider may use a swab to take a mucus sample from your nose (nasal swab). This sample can be tested to determine what virus is causing the illness. How is this treated? URIs usually get better on their own within 7-10 days. Medicines cannot cure URIs, but your health care provider may recommend certain medicines to help relieve symptoms, such as: Over-the-counter cold medicines. Cough suppressants. Coughing is a type of defense against infection that helps to clear the respiratory system, so take these medicines only as recommended by your health care provider. Fever-reducing medicines. Follow these instructions  at home: Activity Rest as needed. If you have a fever, stay home from work or school until your fever is gone or until your health care provider says your URI cannot spread to other people (is no longer contagious). Your health care provider may have you wear a face mask to prevent your infection from spreading. Relieving symptoms Gargle with a mixture of salt and water 3-4 times a day or as needed. To make salt water, completely dissolve -1 tsp (3-6 g) of salt in 1 cup (237 mL) of warm water. Use a cool-mist humidifier to add moisture to the air. This can help you breathe more easily. Eating and drinking  Drink enough fluid to keep your urine pale yellow. Eat soups and other clear broths. General instructions  Take over-the-counter and prescription medicines only as told by your health care provider. These include cold medicines, fever reducers, and cough suppressants. Do not use any products that contain nicotine or tobacco. These products include cigarettes, chewing tobacco, and vaping devices, such as e-cigarettes. If you need help quitting, ask your health care provider. Stay away from secondhand  smoke. Stay up to date on all immunizations, including the yearly (annual) flu vaccine. Keep all follow-up visits. This is important. How to prevent the spread of infection to others URIs can be contagious. To prevent the infection from spreading: Wash your hands with soap and water for at least 20 seconds. If soap and water are not available, use hand sanitizer. Avoid touching your mouth, face, eyes, or nose. Cough or sneeze into a tissue or your sleeve or elbow instead of into your hand or into the air.  Contact a health care provider if: You are getting worse instead of better. You have a fever or chills. Your mucus is brown or red. You have yellow or brown discharge coming from your nose. You have pain in your face, especially when you bend forward. You have swollen neck glands. You have pain while swallowing. You have white areas in the back of your throat. Get help right away if: You have shortness of breath that gets worse. You have severe or persistent: Headache. Ear pain. Sinus pain. Chest pain. You have chronic lung disease along with any of the following: Making high-pitched whistling sounds when you breathe, most often when you breathe out (wheezing). Prolonged cough (more than 14 days). Coughing up blood. A change in your usual mucus. You have a stiff neck. You have changes in your: Vision. Hearing. Thinking. Mood. These symptoms may be an emergency. Get help right away. Call 911. Do not wait to see if the symptoms will go away. Do not drive yourself to the hospital. Summary An upper respiratory infection (URI) is a common infection of the nose, throat, and upper air passages that lead to the lungs. A URI is caused by a virus. URIs usually get better on their own within 7-10 days. Medicines cannot cure URIs, but your health care provider may recommend certain medicines to help relieve symptoms. This information is not intended to replace advice given to you by  your health care provider. Make sure you discuss any questions you have with your health care provider. Document Revised: 09/18/2020 Document Reviewed: 09/18/2020 Elsevier Patient Education  2024 Elsevier Inc.   If you have been instructed to have an in-person evaluation today at a local Urgent Care facility, please use the link below. It will take you to a list of all of our available [facility], including address,  phone number and hours of operation. Please do not delay care.  [facility]  If you or a family member do not have a primary care provider, use the link below to schedule a visit and establish care. When you choose a [facility] primary care physician or advanced practice provider, you gain a long-term partner in health. Find a Primary Care Provider  Learn more about [facility]'s in-office and virtual care options:  - Get Care Now

## 2023-04-17 ENCOUNTER — Telehealth: Payer: BC Managed Care – PPO | Admitting: Family Medicine

## 2023-04-17 DIAGNOSIS — B9689 Other specified bacterial agents as the cause of diseases classified elsewhere: Secondary | ICD-10-CM | POA: Diagnosis not present

## 2023-04-17 DIAGNOSIS — J069 Acute upper respiratory infection, unspecified: Secondary | ICD-10-CM

## 2023-04-17 NOTE — Progress Notes (Signed)
Virtual Visit Consent   Jesse Rodgers, you are scheduled for a virtual visit with a [facility] provider today. Just as with appointments in the office, your consent must be obtained to participate. Your consent will be active for this visit and any virtual visit you may have with one of our providers in the next 365 days. If you have a MyChart account, a copy of this consent can be sent to you electronically.  As this is a virtual visit, video technology does not allow for your provider to perform a traditional examination. This may limit your provider's ability to fully assess your condition. If your provider identifies any concerns that need to be evaluated in person or the need to arrange testing (such as labs, EKG, etc.), we will make arrangements to do so. Although advances in technology are sophisticated, we cannot ensure that it will always work on either your end or our end. If the connection with a video visit is poor, the visit may have to be switched to a telephone visit. With either a video or telephone visit, we are not always able to ensure that we have a secure connection.  By engaging in this virtual visit, you consent to the provision of healthcare and authorize for your insurance to be billed (if applicable) for the services provided during this visit. Depending on your insurance coverage, you may receive a charge related to this service.  I need to obtain your verbal consent now. Are you willing to proceed with your visit today? Jesse Rodgers has provided verbal consent on 04/17/2023 for a virtual visit (video or telephone). Reed Pandy, [place]  Date: 04/17/2023 10:50 AM   Virtual Visit via Video Note   I, Reed Pandy, connected with  Jesse Rodgers  ([id number], 07-13-1975) on 04/17/23 at 10:45 AM EST by a video-enabled telemedicine application and verified that I am speaking with the correct person using two identifiers.  Location: Patient: Virtual Visit Location  Patient: Home Provider: Virtual Visit Location Provider: Home Office   I discussed the limitations of evaluation and management by telemedicine and the availability of in person appointments. The patient expressed understanding and agreed to proceed.    History of Present Illness: Jesse Rodgers is a 48 y.o. who identifies as a male who was assigned male at birth, and is being seen today for c/o feeling ok but feels like he can hear something like fluid in his lungs and he cannot cough it up.  Pt states he was seen via virtual visit a couple of days ago.  Pt states he has a little bit of fever but when he coughs he has difficult time breathing.  HPI: HPI  Problems:  Patient Active Problem List   Diagnosis Date Noted   DOE (dyspnea on exertion) 02/13/2022   OSA (obstructive sleep apnea) 02/13/2022   Chest pain 08/05/2021   Elevated troponin 08/05/2021   Diabetes mellitus type 1 (HCC) 08/05/2021   Rash 08/05/2021   Elevated LFTs 08/05/2021   Uncontrolled type 1 diabetes mellitus with hyperglycemia (HCC) 09/28/2017   Tobacco abuse 09/28/2017   Myositis 10/24/2014   Odontogenic infection of jaw 10/23/2014   Dental infection 10/23/2014   Essential hypertension, benign 10/23/2014   Mixed hyperlipidemia 10/23/2014   Depression 10/23/2014   Dehydration 10/23/2014   DIABETES 05/15/2008   ANKLE PAIN, RIGHT 05/15/2008   Sprain of ankle 05/15/2008    Allergies:  Allergies  Allergen Reactions   Bactrim [Sulfamethoxazole-Trimethoprim] Rash   Medications:  Current Outpatient Medications:    azithromycin (ZITHROMAX) 250 MG tablet, Take 2 tablets on day 1, then 1 tablet daily on days 2 through 5, Disp: 6 tablet, Rfl: 0   benzonatate (TESSALON) 100 MG capsule, Take 1-2 capsules (100-200 mg total) by mouth 3 (three) times daily as needed., Disp: 30 capsule, Rfl: 0   Continuous Blood Gluc Sensor (DEXCOM G6 SENSOR) MISC, Check glucose continuously, Disp: 3 each, Rfl: 2   Continuous Blood Gluc  Sensor (DEXCOM G6 SENSOR) MISC, APPLY 1 SENSOR TO SKIN TO MONITOR GLUCOSE CONTINUOUSLY AS DIRECTED. CHANGE SENSOR EVERY 10 DAYS., Disp: 3 each, Rfl: 2   Continuous Glucose Sensor (DEXCOM G6 SENSOR) MISC, Apply new sensor every 10 days as directed., Disp: 9 each, Rfl: 3   Continuous Glucose Transmitter (DEXCOM G6 TRANSMITTER) MISC, USE TO TEST BLOOD GLUCOSE 4-5 TIMES DAILY, Disp: 1 each, Rfl: 1   fenofibrate (TRICOR) 145 MG tablet, Take 1 tablet (145 mg total) by mouth daily., Disp: 90 tablet, Rfl: 3   fluticasone (FLONASE) 50 MCG/ACT nasal spray, Place 2 sprays into both nostrils daily., Disp: 16 g, Rfl: 0   furosemide (LASIX) 20 MG tablet, Take 20 mg by mouth daily as needed., Disp: , Rfl:    Glucagon, rDNA, (GLUCAGON EMERGENCY) 1 MG KIT, Inject 1 mg into the muscle once as needed., Disp: 1 kit, Rfl: 1   Insulin Disposable Pump (OMNIPOD 5 DEXG7G6 PODS GEN 5) MISC, Change pod every 48-72 hrs, Disp: 6 each, Rfl: 6   Insulin Disposable Pump (OMNIPOD 5 G6 INTRO, GEN 5,) KIT, Change pod every 48-72 hrs, Disp: 1 kit, Rfl: 0   metoprolol tartrate (LOPRESSOR) 25 MG tablet, TAKE 1 TABLET(25 MG) BY MOUTH TWICE DAILY, Disp: 180 tablet, Rfl: 3   Multiple Vitamin (MULTIVITAMIN WITH MINERALS) TABS tablet, Take 1 tablet by mouth daily., Disp: , Rfl:    NON FORMULARY, Taking Total Beet product daily, Disp: , Rfl:    NOVOLOG 100 UNIT/ML injection, USE WITH OMNIPOD FOR TOTAL DAILY DOSE AROUND 100 UNITS, Disp: 80 mL, Rfl: 0   olmesartan (BENICAR) 5 MG tablet, Take 1 tablet (5 mg total) by mouth daily., Disp: 30 tablet, Rfl: 11   OTEZLA 30 MG TABS, Take 1 tablet by mouth 2 (two) times daily., Disp: , Rfl:    promethazine-dextromethorphan (PROMETHAZINE-DM) 6.25-15 MG/5ML syrup, Take 5 mLs by mouth at bedtime., Disp: 118 mL, Rfl: 0   rosuvastatin (CRESTOR) 20 MG tablet, TAKE 1 TABLET(20 MG) BY MOUTH DAILY, Disp: 90 tablet, Rfl: 3   sertraline (ZOLOFT) 100 MG tablet, Take 1 tablet (100 mg total) by mouth daily., Disp: 30  tablet, Rfl: 0   tiZANidine (ZANAFLEX) 4 MG tablet, Take 1 tablet (4 mg total) by mouth every 8 (eight) hours as needed for muscle spasms. Do not take with alcohol or while driving or operating heavy machinery.  May cause drowsiness., Disp: 30 tablet, Rfl: 0  Observations/Objective: Patient is well-developed, well-nourished in no acute distress.  Resting comfortably at home.  Head is normocephalic, atraumatic.  No labored breathing.  Speech is clear and coherent with logical content.  Patient is alert and oriented at baseline.    Assessment and Plan: 1. Bacterial upper respiratory infection (Primary)  -Pt was prescribed antibiotics, two cough medicines and he is still having issues with coughing and breathing  -Pt was advised to follow up in person and nearest urgent care.   -Pt provided with local urgent cares via MyChart -Pt verbalized understanding.   Follow Up Instructions: I  discussed the assessment and treatment plan with the patient. The patient was provided an opportunity to ask questions and all were answered. The patient agreed with the plan and demonstrated an understanding of the instructions.  A copy of instructions were sent to the patient via MyChart unless otherwise noted below.    The patient was advised to call back or seek an in-person evaluation if the symptoms worsen or if the condition fails to improve as anticipated.    Reed Pandy, PA-C

## 2023-04-17 NOTE — Patient Instructions (Signed)
Jesse Rodgers, thank you for joining Reed Pandy, PA-C for today's virtual visit.  While this provider is not your primary care provider (PCP), if your PCP is located in our provider database this encounter information will be shared with them immediately following your visit.   A [facility] MyChart account gives you access to today's visit and all your visits, tests, and labs performed at [facility] " click here if you don't have a [facility] MyChart account or go to [email or web address]  Consent: (Patient) Jesse Rodgers provided verbal consent for this virtual visit at the beginning of the encounter.  Current Medications:  Current Outpatient Medications:    azithromycin (ZITHROMAX) 250 MG tablet, Take 2 tablets on day 1, then 1 tablet daily on days 2 through 5, Disp: 6 tablet, Rfl: 0   benzonatate (TESSALON) 100 MG capsule, Take 1-2 capsules (100-200 mg total) by mouth 3 (three) times daily as needed., Disp: 30 capsule, Rfl: 0   Continuous Blood Gluc Sensor (DEXCOM G6 SENSOR) MISC, Check glucose continuously, Disp: 3 each, Rfl: 2   Continuous Blood Gluc Sensor (DEXCOM G6 SENSOR) MISC, APPLY 1 SENSOR TO SKIN TO MONITOR GLUCOSE CONTINUOUSLY AS DIRECTED. CHANGE SENSOR EVERY 10 DAYS., Disp: 3 each, Rfl: 2   Continuous Glucose Sensor (DEXCOM G6 SENSOR) MISC, Apply new sensor every 10 days as directed., Disp: 9 each, Rfl: 3   Continuous Glucose Transmitter (DEXCOM G6 TRANSMITTER) MISC, USE TO TEST BLOOD GLUCOSE 4-5 TIMES DAILY, Disp: 1 each, Rfl: 1   fenofibrate (TRICOR) 145 MG tablet, Take 1 tablet (145 mg total) by mouth daily., Disp: 90 tablet, Rfl: 3   fluticasone (FLONASE) 50 MCG/ACT nasal spray, Place 2 sprays into both nostrils daily., Disp: 16 g, Rfl: 0   furosemide (LASIX) 20 MG tablet, Take 20 mg by mouth daily as needed., Disp: , Rfl:    Glucagon, rDNA, (GLUCAGON EMERGENCY) 1 MG KIT, Inject 1 mg into the muscle once as needed., Disp: 1 kit, Rfl: 1   Insulin  Disposable Pump (OMNIPOD 5 DEXG7G6 PODS GEN 5) MISC, Change pod every 48-72 hrs, Disp: 6 each, Rfl: 6   Insulin Disposable Pump (OMNIPOD 5 G6 INTRO, GEN 5,) KIT, Change pod every 48-72 hrs, Disp: 1 kit, Rfl: 0   metoprolol tartrate (LOPRESSOR) 25 MG tablet, TAKE 1 TABLET(25 MG) BY MOUTH TWICE DAILY, Disp: 180 tablet, Rfl: 3   Multiple Vitamin (MULTIVITAMIN WITH MINERALS) TABS tablet, Take 1 tablet by mouth daily., Disp: , Rfl:    NON FORMULARY, Taking Total Beet product daily, Disp: , Rfl:    NOVOLOG 100 UNIT/ML injection, USE WITH OMNIPOD FOR TOTAL DAILY DOSE AROUND 100 UNITS, Disp: 80 mL, Rfl: 0   olmesartan (BENICAR) 5 MG tablet, Take 1 tablet (5 mg total) by mouth daily., Disp: 30 tablet, Rfl: 11   OTEZLA 30 MG TABS, Take 1 tablet by mouth 2 (two) times daily., Disp: , Rfl:    promethazine-dextromethorphan (PROMETHAZINE-DM) 6.25-15 MG/5ML syrup, Take 5 mLs by mouth at bedtime., Disp: 118 mL, Rfl: 0   rosuvastatin (CRESTOR) 20 MG tablet, TAKE 1 TABLET(20 MG) BY MOUTH DAILY, Disp: 90 tablet, Rfl: 3   sertraline (ZOLOFT) 100 MG tablet, Take 1 tablet (100 mg total) by mouth daily., Disp: 30 tablet, Rfl: 0   tiZANidine (ZANAFLEX) 4 MG tablet, Take 1 tablet (4 mg total) by mouth every 8 (eight) hours as needed for muscle spasms. Do not take with alcohol or while driving or operating heavy machinery.  May cause drowsiness.,  Disp: 30 tablet, Rfl: 0   Medications ordered in this encounter:  No orders of the defined types were placed in this encounter.    *If you need refills on other medications prior to your next appointment, please contact your pharmacy*  Follow-Up: Call back or seek an in-person evaluation if the symptoms worsen or if the condition fails to improve as anticipated.  [facility] [phone]  Other Instructions [facility]  [facility] at [facility]  [phone]  [address]  [place], [place] [address]  [facility])  [phone]  [address]  [place], [place] [address]  [facility] Urgent [facility])  [phone]  [address]  [place], [place] [address]  [facility] Care at [facility]  [phone] [address]  [place], [place] [address]  [facility] Urgent Care at [facility]  [phone]  [address]  [address]  [place], [place] [address]  [facility] Urgent Care at [facility]  [phone]  [address]  [place], [place] [address]  [facility] Emergency Departments  Emergency Department-[facility]  [phone]  [address]  [place], [place] [address]  open 24/7/365  [facility] Emergency Department at [facility]  [phone]  [address]  [place], [place] [address]  open 24/7/365    If you have been instructed to have an in-person evaluation today at a local Urgent Care facility, please use the link below. It will take you to a list of all of our available [facility], including address, phone number and hours of operation. Please do not delay care.  [facility]  If you or a family member do not have a primary care provider, use the link below to schedule a visit and establish care. When you choose a [facility] primary care physician or advanced practice provider, you gain a long-term partner in health. Find a Primary Care Provider  Learn more about [facility]'s in-office and virtual care options: [facility] - Get Care Now

## 2023-04-26 ENCOUNTER — Ambulatory Visit (INDEPENDENT_AMBULATORY_CARE_PROVIDER_SITE_OTHER): Payer: BC Managed Care – PPO | Admitting: Nurse Practitioner

## 2023-04-26 ENCOUNTER — Encounter: Payer: Self-pay | Admitting: Nurse Practitioner

## 2023-04-26 VITALS — BP 112/58 | HR 76 | Ht 75.0 in | Wt 229.0 lb

## 2023-04-26 DIAGNOSIS — I10 Essential (primary) hypertension: Secondary | ICD-10-CM

## 2023-04-26 DIAGNOSIS — E1065 Type 1 diabetes mellitus with hyperglycemia: Secondary | ICD-10-CM | POA: Diagnosis not present

## 2023-04-26 DIAGNOSIS — Z794 Long term (current) use of insulin: Secondary | ICD-10-CM | POA: Diagnosis not present

## 2023-04-26 DIAGNOSIS — E782 Mixed hyperlipidemia: Secondary | ICD-10-CM

## 2023-04-26 LAB — POCT GLYCOSYLATED HEMOGLOBIN (HGB A1C): Hemoglobin A1C: 7.1 % — AB (ref 4.0–5.6)

## 2023-04-26 MED ORDER — DEXCOM G6 TRANSMITTER MISC: 3 refills | Status: AC

## 2023-04-26 MED ORDER — INSULIN ASPART 100 UNIT/ML IJ SOLN: INTRAMUSCULAR | 0 refills | Status: DC

## 2023-04-26 MED ORDER — OMNIPOD 5 DEXG7G6 PODS GEN 5 MISC
6 refills | Status: DC
Start: 2023-04-26 — End: 2023-05-20

## 2023-04-26 NOTE — Progress Notes (Signed)
 04/26/2023, 5:03 PM             Endocrinology follow-up note   Subjective:    Patient ID: Jesse Rodgers, male    DOB: 30-May-1975.  Jesse Rodgers is being seen in follow-up for management of currently uncontrolled symptomatic diabetes requested by  Assunta Found, MD.   Past Medical History:  Diagnosis Date   Arthritis    back    Depression    takes Zoloft daily   Diabetes mellitus without complication (HCC)    takes Toujeo and Novolog nightly;average fasting blood sugar runs 150   History of kidney stones    Hyperlipidemia    takes Simvastatin daily   Hypertension    takes Losartan daily   Past Surgical History:  Procedure Laterality Date   ABDOMINAL SURGERY     APPENDECTOMY     BACK SURGERY     DENTAL SURGERY  12/10/2014   molar tooth #  [id number],15,16  bone biopsy  of ramus   PARTIAL COLECTOMY     TOOTH EXTRACTION N/A 12/10/2014   Procedure: EXTRACTION MOLARS #14,15,16  INTRAORAL AND EXTRAORAL INCISION AND DRAINAGE AND BONE BIOPSY OF LEFT RAMUS;  Surgeon: [name], DDS;  Location: [facility] OR;  Service: Oral Surgery;  Laterality: N/A;   Social History   Socioeconomic History   Marital status: Married    Spouse name: Not on file   Number of children: Not on file   Years of education: Not on file   Highest education level: Not on file  Occupational History   Not on file  Tobacco Use   Smoking status: Former    Current packs/day: 0.00    Average packs/day: 1 pack/day for 30.0 years (30.0 ttl pk-yrs)    Types: Cigarettes    Start date: 02/07/1989    Quit date: 02/08/2019    Years since quitting: 4.2   Smokeless tobacco: Never   Tobacco comments:    Pt no longer vapes as of December 1,2023 LW  Vaping Use   Vaping status: Every Day  Substance and Sexual Activity   Alcohol use: Yes    Comment: occasional   Drug use: No   Sexual activity: Yes  Other Topics Concern   Not  on file  Social History Narrative   Not on file   Social Drivers of Health   Financial Resource Strain: Not on file  Food Insecurity: Not on file  Transportation Needs: Not on file  Physical Activity: Not on file  Stress: Not on file  Social Connections: Not on file   Outpatient Encounter Medications as of 04/26/2023  Medication Sig   benzonatate (TESSALON) 100 MG capsule Take 1-2 capsules (100-200 mg total) by mouth 3 (three) times daily as needed.   Continuous Blood Gluc Sensor (DEXCOM G6 SENSOR) MISC Check glucose continuously   Continuous Blood Gluc Sensor (DEXCOM G6 SENSOR) MISC APPLY 1 SENSOR TO SKIN TO MONITOR GLUCOSE CONTINUOUSLY AS DIRECTED. CHANGE SENSOR EVERY 10 DAYS.   Continuous Glucose Sensor (DEXCOM G6 SENSOR) MISC Apply new sensor every 10 days as directed.   fenofibrate (TRICOR) 145  MG tablet Take 1 tablet (145 mg total) by mouth daily.   fluticasone (FLONASE) 50 MCG/ACT nasal spray Place 2 sprays into both nostrils daily.   Glucagon, rDNA, (GLUCAGON EMERGENCY) 1 MG KIT Inject 1 mg into the muscle once as needed.   Insulin Disposable Pump (OMNIPOD 5 G6 INTRO, GEN 5,) KIT Change pod every 48-72 hrs   metoprolol tartrate (LOPRESSOR) 25 MG tablet TAKE 1 TABLET(25 MG) BY MOUTH TWICE DAILY   Multiple Vitamin (MULTIVITAMIN WITH MINERALS) TABS tablet Take 1 tablet by mouth daily.   NON FORMULARY Taking Total Beet product daily   olmesartan (BENICAR) 5 MG tablet Take 1 tablet (5 mg total) by mouth daily.   promethazine-dextromethorphan (PROMETHAZINE-DM) 6.25-15 MG/5ML syrup Take 5 mLs by mouth at bedtime.   rosuvastatin (CRESTOR) 20 MG tablet TAKE 1 TABLET(20 MG) BY MOUTH DAILY   sertraline (ZOLOFT) 100 MG tablet Take 1 tablet (100 mg total) by mouth daily.   [DISCONTINUED] Continuous Glucose Transmitter (DEXCOM G6 TRANSMITTER) MISC USE TO TEST BLOOD GLUCOSE 4-5 TIMES DAILY   [DISCONTINUED] Insulin Disposable Pump (OMNIPOD 5 DEXG7G6 PODS GEN 5) MISC Change pod every 48-72 hrs    [DISCONTINUED] NOVOLOG 100 UNIT/ML injection USE WITH OMNIPOD FOR TOTAL DAILY DOSE AROUND 100 UNITS   Continuous Glucose Transmitter (DEXCOM G6 TRANSMITTER) MISC USE TO TEST BLOOD GLUCOSE 4-5 TIMES DAILY   insulin aspart (NOVOLOG) 100 UNIT/ML injection Use with Omnipod for TDD around 110 units per day.   Insulin Disposable Pump (OMNIPOD 5 DEXG7G6 PODS GEN 5) MISC Change pod every 48-72 hrs   OTEZLA 30 MG TABS Take 1 tablet by mouth 2 (two) times daily. (Patient not taking: Reported on 04/26/2023)   [DISCONTINUED] furosemide (LASIX) 20 MG tablet Take 20 mg by mouth daily as needed.   [DISCONTINUED] tiZANidine (ZANAFLEX) 4 MG tablet Take 1 tablet (4 mg total) by mouth every 8 (eight) hours as needed for muscle spasms. Do not take with alcohol or while driving or operating heavy machinery.  May cause drowsiness.   No facility-administered encounter medications on file as of 04/26/2023.    ALLERGIES: Allergies  Allergen Reactions   Bactrim [Sulfamethoxazole-Trimethoprim] Rash    VACCINATION STATUS: Immunization History  Administered Date(s) Administered   Influenza,inj,Quad PF,6+ Mos 12/22/2021   Influenza-Unspecified 11/23/2014    Diabetes He presents for his follow-up diabetic visit. He has type 1 diabetes mellitus. Onset time: He was diagnosed at approximate age of 48 years. His disease course has been improving. There are no hypoglycemic associated symptoms. Pertinent negatives for hypoglycemia include no confusion, headaches, pallor or seizures. Pertinent negatives for diabetes include no chest pain, no fatigue, no polydipsia, no polyphagia, no polyuria and no weakness. There are no hypoglycemic complications. Symptoms are stable. There are no diabetic complications. Risk factors for coronary artery disease include dyslipidemia, diabetes mellitus, hypertension, family history, male sex, tobacco exposure and sedentary lifestyle. Current diabetic treatment includes insulin pump. He is  compliant with treatment most of the time. His weight is fluctuating minimally. He is following a generally unhealthy diet. When asked about meal planning, he reported none. He has not had a previous visit with a dietitian. He never participates in exercise. His home blood glucose trend is fluctuating minimally. His overall blood glucose range is 140-180 mg/dl. (He presents today with his CGM and Omnipod 5 combo showing stable, mostly at target glycemic profile.  His POCT A1c today is 7.1, increasing from last visit of 6.8%.  Analysis of his CGM shows TIR 66%, TAR 35%,  TBR 1% with a GMI of 7.2%.  He denies any significant hypoglycemia since last visit.  He did get sick between visits, is still getting over the lingering cough.  He notes he completely stopped drinking alcohol since last visit.) An ACE inhibitor/angiotensin II receptor blocker is being taken. He sees a podiatrist.Eye exam is current.  Hyperlipidemia This is a chronic problem. The current episode started more than 1 year ago. The problem is uncontrolled. Recent lipid tests were reviewed and are variable. Exacerbating diseases include diabetes and obesity. Factors aggravating his hyperlipidemia include fatty foods. Pertinent negatives include no chest pain, myalgias or shortness of breath. Current antihyperlipidemic treatment includes statins. The current treatment provides mild improvement of lipids. Compliance problems include adherence to diet and adherence to exercise.  Risk factors for coronary artery disease include diabetes mellitus, dyslipidemia, hypertension, male sex, a sedentary lifestyle and obesity.  Hypertension This is a chronic problem. The current episode started more than 1 year ago. The problem has been gradually improving since onset. The problem is controlled. Pertinent negatives include no chest pain, headaches, neck pain, palpitations or shortness of breath. There are no associated agents to hypertension. Risk factors for  coronary artery disease include diabetes mellitus, dyslipidemia, male gender, smoking/tobacco exposure, sedentary lifestyle and obesity. Past treatments include ACE inhibitors and diuretics. The current treatment provides mild improvement. Compliance problems include exercise and diet.     Review of systems  Constitutional: + minimally fluctuating body weight,  current Body mass index is 28.62 kg/m. , + fatigue, no subjective hyperthermia, no subjective hypothermia Eyes: no blurry vision, no xerophthalmia ENT: no sore throat, no nodules palpated in throat, no dysphagia/odynophagia, no hoarseness Cardiovascular: no chest pain, no shortness of breath, no palpitations, no leg swelling Respiratory: no cough, no shortness of breath Gastrointestinal: no nausea/vomiting/diarrhea Musculoskeletal: no muscle/joint aches, generalized joint swelling Skin: no rashes, no hyperemia Neurological: no tremors, no numbness, no tingling, no dizziness Psychiatric: no depression, no anxiety   Objective:    BP (!) 112/58 (BP Location: Left Arm, Patient Position: Sitting, Cuff Size: Large)   Pulse 76   Ht 6\' 3"  (1.905 m)   Wt 229 lb (103.9 kg)   BMI 28.62 kg/m   Wt Readings from Last 3 Encounters:  04/26/23 229 lb (103.9 kg)  01/11/23 228 lb 12.8 oz (103.8 kg)  12/21/22 237 lb 3.2 oz (107.6 kg)    BP Readings from Last 3 Encounters:  04/26/23 (!) 112/58  01/13/23 (!) 142/84  01/11/23 (!) 150/80     Physical Exam- Limited  Constitutional:  Body mass index is 28.62 kg/m. , not in acute distress, normal state of mind Eyes:  EOMI, no exophthalmos Musculoskeletal: no gross deformities, strength intact in all four extremities, no gross restriction of joint movements Skin:  no rashes, no hyperemia Neurological: no tremor with outstretched hands   Diabetic Foot Exam - Simple   No data filed       Recent Results (from the past 2160 hours)  HgB A1c     Status: Abnormal   Collection Time:  04/26/23  3:25 PM  Result Value Ref Range   Hemoglobin A1C 7.1 (A) 4.0 - 5.6 %   HbA1c POC (<> result, manual entry)     HbA1c, POC (prediabetic range)     HbA1c, POC (controlled diabetic range)          Assessment & Plan:   1) Controlled type 1 diabetes mellitus without complications (HCC)  - Jesse Rodgers has  currently uncontrolled symptomatic type 1 DM since 48 years of age.  He denies any history of diabetes ketoacidosis.  He presents today with his CGM and Omnipod 5 combo showing stable, mostly at target glycemic profile.  His POCT A1c today is 7.1, increasing from last visit of 6.8%.  Analysis of his CGM shows TIR 66%, TAR 35%, TBR 1% with a GMI of 7.2%.  He denies any significant hypoglycemia since last visit.  He did get sick between visits, is still getting over the lingering cough.  He notes he completely stopped drinking alcohol since last visit.  -his diabetes is complicated by chronic heavy smoking and Jesse Rodgers remains at a high risk for more acute and chronic complications which include CAD, CVA, CKD, retinopathy, and neuropathy. These are all discussed in detail with the patient.  The following Lifestyle Medicine recommendations according to American College of Lifestyle Medicine [facility]) were discussed and offered to patient and he agrees to start the journey:  A. Whole Foods, Plant-based plate comprising of fruits and vegetables, plant-based proteins, whole-grain carbohydrates was discussed in detail with the patient.   A list for source of those nutrients were also provided to the patient.  Patient will use only water or unsweetened tea for hydration. B.  The need to stay away from risky substances including alcohol, smoking; obtaining 7 to 9 hours of restorative sleep, at least 150 minutes of moderate intensity exercise weekly, the importance of healthy social connections,  and stress reduction techniques were discussed. C.  A full color page of  Calorie density  of various food groups per pound showing examples of each food groups was provided to the patient.  - Nutritional counseling repeated at each appointment due to patients tendency to fall back in to old habits.  - The patient admits there is a room for improvement in their diet and drink choices. -  Suggestion is made for the patient to avoid simple carbohydrates from their diet including Cakes, Sweet Desserts / Pastries, Ice Cream, Soda (diet and regular), Sweet Tea, Candies, Chips, Cookies, Sweet Pastries, Store Bought Juices, Alcohol in Excess of 1-2 drinks a day, Artificial Sweeteners, Coffee Creamer, and "Sugar-free" Products. This will help patient to have stable blood glucose profile and potentially avoid unintended weight gain.   - I encouraged the patient to switch to unprocessed or minimally processed complex starch and increased protein intake (animal or plant source), fruits, and vegetables.   - Patient is advised to stick to a routine mealtimes to eat 3 meals a day and avoid unnecessary snacks (to snack only to correct hypoglycemia).  - I have approached him with the following individualized plan to manage diabetes and patient agrees:   -He has clearly benefited from his CGM device, he is advised to continue using it.  -He is doing well with his current Omnipod 5 settings, therefore, I did not make any changes to his pump settings today.    -He is advised to continue monitoring blood glucose 4 times daily, before meals and before bedtime using his CGM, and call the clinic if he has readings less than 70 or greater than 300 for 3 tests in a row.   - Patient is warned not to take insulin without proper monitoring per orders. -Adjustment parameters are given for hypo and hyperglycemia in writing.  - Patient specific target  A1c;  LDL, HDL, Triglycerides  were discussed in detail.  2) BP/HTN:  Blood pressure is controlled to target.  He  is advised to continue his current blood  pressure medications including Losartan 25 mg p.o. daily and Lasix 20 mg po daily.    3) Lipids/HPL:  His most recent lipid panel from 08/17/22 shows high triglycerides of 561.  He is advised to continue Crestor 20 mg po daily at bedtime and his Omega 3 Fatty acids.    Also advised him to avoid fried foods and butter.   I also started him on Fenofibrate 145 mg po daily.  Will recheck lipid panel prior to next visit.  4)  Weight/Diet: His Body mass index is 28.62 kg/m.-not a candidate for major weight loss.  CDE Consult has been initiated , exercise, and detailed carbohydrates information provided.  5) Chronic Care/Health Maintenance: -he is on ARB and Statin medications and is encouraged to initiate and continue to follow up with Ophthalmology, Dentist,  Podiatrist at least yearly or according to recommendations, and he is advised to stay away from smoking.  I have recommended yearly flu vaccine and pneumonia vaccine at least every 5 years; moderate intensity exercise for up to 150 minutes weekly; and sleep for at least 7 hours a day.  - I advised patient to maintain close follow up with Assunta Found, MD for primary care needs.     I spent  41  minutes in the care of the patient today including review of labs from CMP, Lipids, Thyroid Function, Hematology (current and previous including abstractions from other facilities); face-to-face time discussing  his blood glucose readings/logs, discussing hypoglycemia and hyperglycemia episodes and symptoms, medications doses, his options of short and long term treatment based on the latest standards of care / guidelines;  discussion about incorporating lifestyle medicine;  and documenting the encounter. Risk reduction counseling performed per USPSTF guidelines to reduce obesity and cardiovascular risk factors.     Please refer to Patient Instructions for Blood Glucose Monitoring and Insulin/Medications Dosing Guide"  in media tab for additional  information. Please  also refer to " Patient Self Inventory" in the Media  tab for reviewed elements of pertinent patient history.  Jesse Rodgers participated in the discussions, expressed understanding, and voiced agreement with the above plans.  All questions were answered to his satisfaction. he is encouraged to contact clinic should he have any questions or concerns prior to his return visit.    Follow up plan: - Return in about 6 months (around 10/24/2023) for Diabetes F/U with A1c in office, Previsit labs, Bring meter and logs.  Ronny Bacon, [facility] [address] [place], [place] [address] Phone: [phone] Fax: [phone]  04/26/2023, 5:03 PM

## 2023-05-20 ENCOUNTER — Other Ambulatory Visit: Payer: Self-pay | Admitting: Nurse Practitioner

## 2023-05-24 ENCOUNTER — Other Ambulatory Visit: Payer: Self-pay | Admitting: Nurse Practitioner

## 2023-05-24 MED ORDER — OMNIPOD 5 DEXG7G6 PODS GEN 5 MISC: 6 refills | Status: DC

## 2023-06-03 DIAGNOSIS — M1991 Primary osteoarthritis, unspecified site: Secondary | ICD-10-CM | POA: Diagnosis not present

## 2023-06-03 DIAGNOSIS — L4050 Arthropathic psoriasis, unspecified: Secondary | ICD-10-CM | POA: Diagnosis not present

## 2023-06-03 DIAGNOSIS — M2560 Stiffness of unspecified joint, not elsewhere classified: Secondary | ICD-10-CM | POA: Diagnosis not present

## 2023-06-03 DIAGNOSIS — M25561 Pain in right knee: Secondary | ICD-10-CM | POA: Diagnosis not present

## 2023-06-07 ENCOUNTER — Telehealth: Payer: Self-pay

## 2023-06-07 ENCOUNTER — Other Ambulatory Visit (HOSPITAL_COMMUNITY): Payer: Self-pay

## 2023-06-07 NOTE — Telephone Encounter (Signed)
 Pharmacy Patient Advocate Encounter   Received notification from CoverMyMeds that prior authorization for Omnipod is required/requested.   Insurance verification completed.   The patient is insured through [employer] [place]  .   Per test claim: PA required and submitted KEY/EOC/Request #: [id number] CANCELLED due to PA not required. Medication available to pt without authorization

## 2023-06-10 DIAGNOSIS — E1142 Type 2 diabetes mellitus with diabetic polyneuropathy: Secondary | ICD-10-CM | POA: Diagnosis not present

## 2023-06-17 ENCOUNTER — Telehealth: Payer: Self-pay | Admitting: *Deleted

## 2023-06-17 ENCOUNTER — Other Ambulatory Visit: Payer: Self-pay | Admitting: Nurse Practitioner

## 2023-06-17 MED ORDER — DEXCOM G7 SENSOR MISC: 1.0000 | 3 refills | Status: DC

## 2023-06-17 NOTE — Telephone Encounter (Signed)
 Patient was called and made aware.

## 2023-06-17 NOTE — Telephone Encounter (Signed)
 He wouldn't need an appointment.  Essentially, we can send in script for G7 and once he gets them from the pharmacy, he can reach out to Omnipod to make sure the transition goes smoothly.  He just has to download the G7 app on his phone (use the same username and pswd as the G6 app) and it should link up like the other did.  I went ahead and sent the script to [facility].

## 2023-06-17 NOTE — Telephone Encounter (Signed)
 Patient left a message that he cannot get a Dexcom G 6 sensor any where. He wants to know if he needs to make appointment to get started on a Dexcom G 7. The appointment would have to be after 3 pm.

## 2023-06-27 ENCOUNTER — Other Ambulatory Visit: Payer: Self-pay | Admitting: Nurse Practitioner

## 2023-07-05 ENCOUNTER — Ambulatory Visit
Admission: EM | Admit: 2023-07-05 | Discharge: 2023-07-05 | Disposition: A | Discharge status: Home / Self Care | Attending: Nurse Practitioner | Admitting: Nurse Practitioner

## 2023-07-05 DIAGNOSIS — M546 Pain in thoracic spine: Secondary | ICD-10-CM | POA: Diagnosis not present

## 2023-07-05 DIAGNOSIS — Z8739 Personal history of other diseases of the musculoskeletal system and connective tissue: Secondary | ICD-10-CM

## 2023-07-05 MED ORDER — TIZANIDINE HCL 4 MG PO TABS: 4.0000 mg | ORAL_TABLET | Freq: Three times a day (TID) | ORAL | 0 refills | Status: DC | PRN

## 2023-07-05 NOTE — Discharge Instructions (Signed)
 Take medication as prescribed. Try to remain as active as possible. Gentle range of motion and stretching exercises to help with back spasm and pain. May apply ice or heat as needed.  Ice is recommended for pain or swelling, heat for spasm or stiffness.  Apply for 20 minutes, remove for 1 hour, then repeat. May take over-the-counter Tylenol  extra strength 500 mg tablet approximately 1 -2 hours after taking ibuprofen  for breakthrough pain. Go to the emergency department immediately if you develop numbness or tingling in your arms or hands, difficulty breathing, loss of bowel or bladder function, other concerns. Follow-up with your primary care physician if your symptoms do not improve.

## 2023-07-05 NOTE — ED Provider Notes (Signed)
 [facility]    CSN: [id number] Arrival date & time: 07/05/23  1447      History   Chief Complaint No chief complaint on file.   HPI Jesse Rodgers is a 48 y.o. male.   The history is provided by the patient.   Patient presents with a 2-day history of mid back pain.  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  States that he was seen by his nurse at work and she was concerned patient may have shingles.  Patient states that his pain is fine in the mornings, but about "midday" is when it begins to bother him.  He is describes the pain as a "cramp" and that it "catches" him from time to time.  Patient states that he has been taking all prescription of tizanidine  for his symptoms.  Further denies difficulty breathing, numbness or tingling in his upper extremities, or radiation of pain.  Past Medical History:  Diagnosis Date  . Arthritis    back   . Depression    takes Zoloft  daily  . Diabetes mellitus without complication (HCC)    takes Toujeo  and Novolog  nightly;average fasting blood sugar runs 150  . History of kidney stones   . Hyperlipidemia    takes Simvastatin  daily  . Hypertension    takes Losartan  daily    Patient Active Problem List   Diagnosis Date Noted  . DOE (dyspnea on exertion) 02/13/2022  . OSA (obstructive sleep apnea) 02/13/2022  . Chest pain 08/05/2021  . Elevated troponin 08/05/2021  . Diabetes mellitus type 1 (HCC) 08/05/2021  . Rash 08/05/2021  . Elevated LFTs 08/05/2021  . Uncontrolled type 1 diabetes mellitus with hyperglycemia (HCC) 09/28/2017  . Tobacco abuse 09/28/2017  . Myositis 10/24/2014  . Odontogenic infection of jaw 10/23/2014  . Dental infection 10/23/2014  . Essential hypertension, benign 10/23/2014  . Mixed hyperlipidemia 10/23/2014  . Depression 10/23/2014  . Dehydration 10/23/2014  . DIABETES  05/15/2008  . ANKLE PAIN, RIGHT 05/15/2008  . Sprain of ankle 05/15/2008    Past Surgical History:  Procedure Laterality Date  . ABDOMINAL SURGERY    . APPENDECTOMY    . BACK SURGERY    . DENTAL SURGERY  12/10/2014   molar tooth #  14,15,16  bone biopsy  of ramus  . PARTIAL COLECTOMY    . TOOTH EXTRACTION N/A 12/10/2014   Procedure: EXTRACTION MOLARS #14,15,16  INTRAORAL AND EXTRAORAL INCISION AND DRAINAGE AND BONE BIOPSY OF LEFT RAMUS;  Surgeon: Josem Nick, DDS;  Location: [facility] OR;  Service: Oral Surgery;  Laterality: N/A;       Home Medications    Prior to Admission medications   Medication Sig Start Date End Date Taking? Authorizing Provider  benzonatate  (TESSALON ) 100 MG capsule Take 1-2 capsules (100-200 mg total) by mouth 3 (three) times daily as needed. 04/14/23   Angelia Kelp, PA-C  Continuous Blood Gluc Sensor (DEXCOM G6 SENSOR) MISC Check glucose continuously 04/20/22   Wendel Hals, NP  Continuous Blood Gluc Sensor (DEXCOM G6 SENSOR) MISC APPLY 1 SENSOR TO SKIN TO MONITOR GLUCOSE CONTINUOUSLY AS DIRECTED. CHANGE SENSOR EVERY 10 DAYS. 05/26/22   Wendel Hals, NP  Continuous Glucose Sensor (DEXCOM G6 SENSOR) MISC Apply new sensor every 10 days as directed. 12/21/22   Wendel Hals, NP  Continuous Glucose Sensor (DEXCOM G7 SENSOR)  MISC Inject 1 Application into the skin as directed. Change sensor every 10 days as directed. 06/17/23   Wendel Hals, NP  Continuous Glucose Transmitter (DEXCOM G6 TRANSMITTER) MISC USE TO TEST BLOOD GLUCOSE 4-5 TIMES DAILY 04/26/23   Wendel Hals, NP  fenofibrate  (TRICOR ) 145 MG tablet Take 1 tablet (145 mg total) by mouth daily. 08/20/22   Wendel Hals, NP  fluticasone  (FLONASE ) 50 MCG/ACT nasal spray Place 2 sprays into both nostrils daily. 04/14/23   Angelia Kelp, PA-C  Glucagon , rDNA, (GLUCAGON  EMERGENCY) 1 MG KIT Inject 1 mg into the muscle once as needed. 08/25/22   Wendel Hals, NP   insulin  aspart (NOVOLOG ) 100 UNIT/ML injection USE WITH OMNIPOD FOR TOTAL DAILY DOSE AROUND 110 UNITS 06/21/23   Wendel Hals, NP  Insulin  Disposable Pump (OMNIPOD 5 DEXG7G6 PODS GEN 5) MISC CHANGE POD EVERY 2-3 DAYS 05/24/23   Wendel Hals, NP  Insulin  Disposable Pump (OMNIPOD 5 G6 INTRO, GEN 5,) KIT Change pod every 48-72 hrs 06/12/21   Wendel Hals, NP  metoprolol  tartrate (LOPRESSOR ) 25 MG tablet TAKE 1 TABLET(25 MG) BY MOUTH TWICE DAILY 01/25/23   Nishan, Peter C, MD  Multiple Vitamin (MULTIVITAMIN WITH MINERALS) TABS tablet Take 1 tablet by mouth daily.    [provider]  NON FORMULARY Taking Total Beet product daily    [provider]  olmesartan  (BENICAR ) 5 MG tablet Take 1 tablet (5 mg total) by mouth daily. 01/11/23   Nishan, Peter C, MD  OTEZLA 30 MG TABS Take 1 tablet by mouth 2 (two) times daily. Patient not taking: Reported on 04/26/2023    [provider]  promethazine -dextromethorphan (PROMETHAZINE -DM) 6.25-15 MG/5ML syrup Take 5 mLs by mouth at bedtime. 04/14/23   Angelia Kelp, PA-C  rosuvastatin  (CRESTOR ) 20 MG tablet TAKE 1 TABLET(20 MG) BY MOUTH DAILY 08/20/22   Wendel Hals, NP  sertraline  (ZOLOFT ) 100 MG tablet Take 1 tablet (100 mg total) by mouth daily. 08/07/21   Gwendalyn Lemma, MD    Family History Family History  Problem Relation Age of Onset  . Coronary artery disease Mother   . Breast cancer Mother   . Alzheimer's disease Father   . Diabetes Sister   . Multiple sclerosis Sister   . Asthma Son     Social History Social History   Tobacco Use  . Smoking status: Former    Current packs/day: 0.00    Average packs/day: 1 pack/day for 30.0 years (30.0 ttl pk-yrs)    Types: Cigarettes    Start date: 02/07/1989    Quit date: 02/08/2019    Years since quitting: 4.4  . Smokeless tobacco: Never  . Tobacco comments:    Pt no longer vapes as of December 1,2023 [facility]  Vaping Use  . Vaping status: Every Day   Substance Use Topics  . Alcohol use: Yes    Comment: occasional  . Drug use: No     Allergies   Bactrim [sulfamethoxazole-trimethoprim]   Review of Systems Review of Systems Per HPI  Physical Exam Triage Vital Signs ED Triage Vitals  Encounter Vitals Group     BP 07/05/23 1621 104/66     Systolic BP Percentile --      Diastolic BP Percentile --      Pulse Rate 07/05/23 1621 92     Resp 07/05/23 1621 16     Temp 07/05/23 1621 98.2 F (36.8 C)     Temp Source 07/05/23 1621  Oral     SpO2 07/05/23 1621 94 %     Weight --      Height --      Head Circumference --      Peak Flow --      Pain Score 07/05/23 1624 10     Pain Loc --      Pain Education --      Exclude from Growth Chart --    No data found.  Updated Vital Signs BP 104/66 (BP Location: Right Arm)   Pulse 92   Temp 98.2 F (36.8 C) (Oral)   Resp 16   SpO2 94%   Visual Acuity Right Eye Distance:   Left Eye Distance:   Bilateral Distance:    Right Eye Near:   Left Eye Near:    Bilateral Near:     Physical Exam Vitals and nursing note reviewed.  Constitutional:      General: He is not in acute distress.    Appearance: Normal appearance.  HENT:     Head: Normocephalic.     Mouth/Throat:     Mouth: Mucous membranes are moist.  Eyes:     Extraocular Movements: Extraocular movements intact.     Conjunctiva/sclera: Conjunctivae normal.     Pupils: Pupils are equal, round, and reactive to light.  Cardiovascular:     Rate and Rhythm: Normal rate and regular rhythm.     Pulses: Normal pulses.     Heart sounds: Normal heart sounds.  Pulmonary:     Effort: Pulmonary effort is normal. No respiratory distress.     Breath sounds: Normal breath sounds. No stridor. No wheezing, rhonchi or rales.  Abdominal:     General: Bowel sounds are normal.     Palpations: Abdomen is soft.     Tenderness: There is no abdominal tenderness.  Musculoskeletal:       Arms:     Cervical back: Normal range of  motion.  Skin:    General: Skin is warm and dry.  Neurological:     General: No focal deficit present.     Mental Status: He is alert and oriented to person, place, and time.  Psychiatric:        Mood and Affect: Mood normal.        Behavior: Behavior normal.     UC Treatments / Results  Labs (all labs ordered are listed, but only abnormal results are displayed) Labs Reviewed - No data to display  EKG   Radiology No results found.  Procedures Procedures (including critical care time)  Medications Ordered in UC Medications - No data to display  Initial Impression / Assessment and Plan / UC Course  I have reviewed the triage vital signs and the nursing notes.  Pertinent labs & imaging results that were available during my care of the patient were reviewed by me and considered in my medical decision making (see chart for details).     *** Final Clinical Impressions(s) / UC Diagnoses   Final diagnoses:  None   Discharge Instructions   None    ED Prescriptions   None    PDMP not reviewed this encounter.

## 2023-07-05 NOTE — ED Triage Notes (Signed)
 Pt reports back pain mid ways of back with redness and tenderness

## 2023-07-14 ENCOUNTER — Telehealth: Payer: Self-pay | Admitting: Nurse Practitioner

## 2023-07-14 NOTE — Telephone Encounter (Signed)
 I would recommend he reach out to [facility] for some tips and tricks to help increase the communication between the 2, perhaps more people have experienced this same issue.

## 2023-07-14 NOTE — Telephone Encounter (Signed)
 Pt states that since going on G7, sends readings to phone but is not communicating with pump(only about half the time).

## 2023-07-16 ENCOUNTER — Other Ambulatory Visit (HOSPITAL_COMMUNITY): Payer: Self-pay

## 2023-07-16 ENCOUNTER — Telehealth: Payer: Self-pay

## 2023-07-16 ENCOUNTER — Telehealth: Payer: Self-pay | Admitting: *Deleted

## 2023-07-16 NOTE — Telephone Encounter (Signed)
 We have received from [facility] that the patient is in need of a PA for his Omnipod 5 Dexom G7G6 Pods. We are sending this to [employer] for urgent PA to be completed.

## 2023-07-16 NOTE — Telephone Encounter (Signed)
 LVM for pt with [facility] Recommendations

## 2023-07-16 NOTE — Telephone Encounter (Signed)
 Pharmacy Patient Advocate Encounter   Received notification from Pt Calls Messages that prior authorization for Omnipod is required/requested.   Insurance verification completed.   The patient is insured through [employer]  .   Per test claim: PA required; PA submitted to above mentioned insurance via CoverMyMeds Key/confirmation #/EOC [id number] Status is pending

## 2023-07-20 ENCOUNTER — Telehealth: Payer: Self-pay | Admitting: *Deleted

## 2023-07-20 NOTE — Telephone Encounter (Signed)
 We had sent to the PA Team for review. e have received from [facility] that the patient is in need of a PA for his Omnipod 5 Dexom G7G6 Pods. We are sending this to [employer] for urgent PA to be completed.  Just sending a followup to see what the status of this PA mabe.

## 2023-07-20 NOTE — Telephone Encounter (Signed)
 Pharmacy Patient Advocate Encounter  Received notification from [facility] [place] that Prior Authorization for OMNIPOD has been APPROVED from 06/16/2023 to 07/15/2024

## 2023-07-21 ENCOUNTER — Telehealth: Payer: Self-pay | Admitting: *Deleted

## 2023-07-21 NOTE — Telephone Encounter (Signed)
 Noted, faxing to [employer].

## 2023-07-21 NOTE — Telephone Encounter (Signed)
 We have received a PA from the patients insurance for the Omnipod 5 DexG7G6 Poda Gen 5 Miscellaneous, for approval and this is valid 06/18/2023-07/15/2024. I called [employer] and made them aware and I am faxing them a copy of it.

## 2023-09-09 DIAGNOSIS — M2560 Stiffness of unspecified joint, not elsewhere classified: Secondary | ICD-10-CM | POA: Diagnosis not present

## 2023-09-09 DIAGNOSIS — L4050 Arthropathic psoriasis, unspecified: Secondary | ICD-10-CM | POA: Diagnosis not present

## 2023-09-09 DIAGNOSIS — M1991 Primary osteoarthritis, unspecified site: Secondary | ICD-10-CM | POA: Diagnosis not present

## 2023-09-09 DIAGNOSIS — M25561 Pain in right knee: Secondary | ICD-10-CM | POA: Diagnosis not present

## 2023-09-10 ENCOUNTER — Other Ambulatory Visit: Payer: Self-pay | Admitting: Nurse Practitioner

## 2023-10-25 ENCOUNTER — Ambulatory Visit: Payer: BC Managed Care – PPO | Admitting: Nurse Practitioner

## 2023-10-28 ENCOUNTER — Telehealth: Payer: Self-pay | Admitting: Cardiovascular Disease

## 2023-10-28 ENCOUNTER — Telehealth: Payer: Self-pay | Admitting: Nurse Practitioner

## 2023-10-28 ENCOUNTER — Other Ambulatory Visit: Payer: Self-pay | Admitting: Nurse Practitioner

## 2023-10-28 MED ORDER — METOPROLOL TARTRATE 25 MG PO TABS: 25.0000 mg | ORAL_TABLET | Freq: Two times a day (BID) | ORAL | 0 refills | Status: DC

## 2023-10-28 MED ORDER — OLMESARTAN MEDOXOMIL 5 MG PO TABS: 5.0000 mg | ORAL_TABLET | Freq: Every day | ORAL | 0 refills | Status: DC

## 2023-10-28 NOTE — Telephone Encounter (Signed)
*  STAT* If patient is at the pharmacy, call can be transferred to refill team.   1. Which medications need to be refilled? (please list name of each medication and dose if known)   metoprolol  tartrate (LOPRESSOR ) 25 MG tablet    olmesartan  (BENICAR ) 5 MG tablet    2. Would you like to learn more about the convenience, safety, & potential cost savings by using the [facility]? No   3. Are you open to using the [facility] (Type [facility]. ). No   4. Which pharmacy/location (including street and city if local pharmacy) is medication to be sent to? [address] [place], [place], [place] [address]  Fax number: [phone]   5. Do they need a 30 day or 90 day supply? 90 day

## 2023-10-28 NOTE — Telephone Encounter (Signed)
Rx refill sent to [facility]. 

## 2023-10-28 NOTE — Telephone Encounter (Signed)
 Pt's medications were sent to pt's pharmacy as requested. Confirmation received.

## 2023-10-28 NOTE — Telephone Encounter (Signed)
 Pt is needing refill of rosuvastatin  sent to [facility]

## 2023-11-09 DIAGNOSIS — E1065 Type 1 diabetes mellitus with hyperglycemia: Secondary | ICD-10-CM | POA: Diagnosis not present

## 2023-11-10 LAB — COMPREHENSIVE METABOLIC PANEL WITH GFR
ALT: 23 IU/L (ref 0–44)
AST: 20 IU/L (ref 0–40)
Albumin: 4.2 g/dL (ref 4.1–5.1)
Alkaline Phosphatase: 78 IU/L (ref 44–121)
BUN/Creatinine Ratio: 19 (ref 9–20)
BUN: 22 mg/dL (ref 6–24)
Bilirubin Total: 0.2 mg/dL (ref 0.0–1.2)
CO2: 23 mmol/L (ref 20–29)
Calcium: 9.4 mg/dL (ref 8.7–10.2)
Chloride: 101 mmol/L (ref 96–106)
Creatinine, Ser: 1.14 mg/dL (ref 0.76–1.27)
Globulin, Total: 2.6 g/dL (ref 1.5–4.5)
Glucose: 161 mg/dL — ABNORMAL HIGH (ref 70–99)
Potassium: 4.7 mmol/L (ref 3.5–5.2)
Sodium: 138 mmol/L (ref 134–144)
Total Protein: 6.8 g/dL (ref 6.0–8.5)
eGFR: 78 mL/min/1.73 (ref >59)

## 2023-11-10 LAB — LIPID PANEL
Chol/HDL Ratio: 5 ratio (ref 0.0–5.0)
Cholesterol, Total: 189 mg/dL (ref 100–199)
HDL: 38 mg/dL — ABNORMAL LOW (ref >39)
LDL Chol Calc (NIH): 94 mg/dL (ref 0–99)
Triglycerides: 343 mg/dL — ABNORMAL HIGH (ref 0–149)
VLDL Cholesterol Cal: 57 mg/dL — ABNORMAL HIGH (ref 5–40)

## 2023-11-10 LAB — TSH: TSH: 2.41 u[IU]/mL (ref 0.450–4.500)

## 2023-11-10 LAB — VITAMIN D 25 HYDROXY (VIT D DEFICIENCY, FRACTURES): Vit D, 25-Hydroxy: 47.4 ng/mL (ref 30.0–100.0)

## 2023-11-10 LAB — T4, FREE: Free T4: 1.1 ng/dL (ref 0.82–1.77)

## 2023-11-15 ENCOUNTER — Encounter: Payer: Self-pay | Admitting: Nurse Practitioner

## 2023-11-15 ENCOUNTER — Ambulatory Visit: Admitting: Nurse Practitioner

## 2023-11-15 VITALS — BP 131/74 | HR 81 | Ht 75.0 in | Wt 238.6 lb

## 2023-11-15 DIAGNOSIS — I10 Essential (primary) hypertension: Secondary | ICD-10-CM | POA: Diagnosis not present

## 2023-11-15 DIAGNOSIS — Z794 Long term (current) use of insulin: Secondary | ICD-10-CM | POA: Diagnosis not present

## 2023-11-15 DIAGNOSIS — E1065 Type 1 diabetes mellitus with hyperglycemia: Secondary | ICD-10-CM | POA: Diagnosis not present

## 2023-11-15 DIAGNOSIS — E782 Mixed hyperlipidemia: Secondary | ICD-10-CM

## 2023-11-15 LAB — POCT GLYCOSYLATED HEMOGLOBIN (HGB A1C): Hemoglobin A1C: 7 % — AB (ref 4.0–5.6)

## 2023-11-15 LAB — POCT UA - MICROALBUMIN
Albumin/Creatinine Ratio, Urine, POC: <30
Creatinine, POC: 300 mg/dL
Microalbumin Ur, POC: 10 mg/L

## 2023-11-15 MED ORDER — DEXCOM G7 SENSOR MISC: 1.0000 | 3 refills | Status: AC

## 2023-11-15 MED ORDER — OMNIPOD 5 DEXG7G6 PODS GEN 5 MISC: 6 refills | Status: AC

## 2023-11-15 MED ORDER — INSULIN ASPART 100 UNIT/ML IJ SOLN: INTRAMUSCULAR | 3 refills | Status: AC

## 2023-11-15 NOTE — Progress Notes (Signed)
 11/15/2023, 4:32 PM             Endocrinology follow-up note   Subjective:    Patient ID: Jesse Rodgers, male    DOB: 1975-04-14.  Jesse Rodgers is being seen in follow-up for management of currently uncontrolled symptomatic diabetes requested by  Bertell Satterfield, MD.   Past Medical History:  Diagnosis Date   Arthritis    back    Depression    takes Zoloft  daily   Diabetes mellitus without complication (HCC)    takes Toujeo  and Novolog  nightly;average fasting blood sugar runs 150   History of kidney stones    Hyperlipidemia    takes Simvastatin  daily   Hypertension    takes Losartan  daily   Past Surgical History:  Procedure Laterality Date   ABDOMINAL SURGERY     APPENDECTOMY     BACK SURGERY     DENTAL SURGERY  12/10/2014   molar tooth #  14,15,16  bone biopsy  of ramus   PARTIAL COLECTOMY     TOOTH EXTRACTION N/A 12/10/2014   Procedure: EXTRACTION MOLARS #14,15,16  INTRAORAL AND EXTRAORAL INCISION AND DRAINAGE AND BONE BIOPSY OF LEFT RAMUS;  Surgeon: Lonni Sax, DDS;  Location: [facility] OR;  Service: Oral Surgery;  Laterality: N/A;   Social History   Socioeconomic History   Marital status: Married    Spouse name: Not on file   Number of children: Not on file   Years of education: Not on file   Highest education level: Not on file  Occupational History   Not on file  Tobacco Use   Smoking status: Former    Current packs/day: 0.00    Average packs/day: 1 pack/day for 30.0 years (30.0 ttl pk-yrs)    Types: Cigarettes    Start date: 02/07/1989    Quit date: 02/08/2019    Years since quitting: 4.7   Smokeless tobacco: Never   Tobacco comments:    Pt no longer vapes as of December 1,2023 LW  Vaping Use   Vaping status: Every Day  Substance and Sexual Activity   Alcohol use: Yes    Comment: occasional   Drug use: No   Sexual activity: Yes  Other Topics Concern    Not on file  Social History Narrative   Not on file   Social Drivers of Health   Financial Resource Strain: Not on file  Food Insecurity: Not on file  Transportation Needs: Not on file  Physical Activity: Not on file  Stress: Not on file  Social Connections: Not on file   Outpatient Encounter Medications as of 11/15/2023  Medication Sig   benzonatate  (TESSALON ) 100 MG capsule Take 1-2 capsules (100-200 mg total) by mouth 3 (three) times daily as needed.   Continuous Blood Gluc Sensor (DEXCOM G6 SENSOR) MISC Check glucose continuously   Continuous Blood Gluc Sensor (DEXCOM G6 SENSOR) MISC APPLY 1 SENSOR TO SKIN TO MONITOR GLUCOSE CONTINUOUSLY AS DIRECTED. CHANGE SENSOR EVERY 10 DAYS.   Continuous Glucose Sensor (DEXCOM G6 SENSOR) MISC Apply new sensor every 10 days as directed.   Continuous Glucose Transmitter (  DEXCOM G6 TRANSMITTER) MISC USE TO TEST BLOOD GLUCOSE 4-5 TIMES DAILY   fenofibrate  (TRICOR ) 145 MG tablet TAKE 1 TABLET(145 MG) BY MOUTH DAILY   fluticasone  (FLONASE ) 50 MCG/ACT nasal spray Place 2 sprays into both nostrils daily.   Glucagon , rDNA, (GLUCAGON  EMERGENCY) 1 MG KIT Inject 1 mg into the muscle once as needed.   Insulin  Disposable Pump (OMNIPOD 5 G6 INTRO, GEN 5,) KIT Change pod every 48-72 hrs   metoprolol  tartrate (LOPRESSOR ) 25 MG tablet Take 1 tablet (25 mg total) by mouth 2 (two) times daily.   Multiple Vitamin (MULTIVITAMIN WITH MINERALS) TABS tablet Take 1 tablet by mouth daily.   NON FORMULARY Taking Total Beet product daily   olmesartan  (BENICAR ) 5 MG tablet Take 1 tablet (5 mg total) by mouth daily.   OTEZLA 30 MG TABS Take 1 tablet by mouth 2 (two) times daily.   promethazine -dextromethorphan (PROMETHAZINE -DM) 6.25-15 MG/5ML syrup Take 5 mLs by mouth at bedtime.   rosuvastatin  (CRESTOR ) 20 MG tablet TAKE ONE TABLET BY MOUTH EVERY DAY   sertraline  (ZOLOFT ) 100 MG tablet Take 1 tablet (100 mg total) by mouth daily.   tiZANidine  (ZANAFLEX ) 4 MG tablet Take 1  tablet (4 mg total) by mouth every 8 (eight) hours as needed for muscle spasms.   [DISCONTINUED] Continuous Glucose Sensor (DEXCOM G7 SENSOR) MISC Inject 1 Application into the skin as directed. Change sensor every 10 days as directed.   [DISCONTINUED] insulin  aspart (NOVOLOG ) 100 UNIT/ML injection USE WITH OMNIPOD FOR TOTAL DAILY DOSE AROUND 110 UNITS   [DISCONTINUED] Insulin  Disposable Pump (OMNIPOD 5 DEXG7G6 PODS GEN 5) MISC CHANGE POD EVERY 2-3 DAYS   Continuous Glucose Sensor (DEXCOM G7 SENSOR) MISC Inject 1 Application into the skin as directed. Change sensor every 10 days as directed.   insulin  aspart (NOVOLOG ) 100 UNIT/ML injection USE WITH OMNIPOD FOR TOTAL DAILY DOSE AROUND 110 UNITS   Insulin  Disposable Pump (OMNIPOD 5 DEXG7G6 PODS GEN 5) MISC CHANGE POD EVERY 2-3 DAYS   No facility-administered encounter medications on file as of 11/15/2023.    ALLERGIES: Allergies  Allergen Reactions   Bactrim [Sulfamethoxazole-Trimethoprim] Rash    VACCINATION STATUS: Immunization History  Administered Date(s) Administered   Influenza,inj,Quad PF,6+ Mos 12/22/2021   Influenza-Unspecified 11/23/2014    Diabetes He presents for his follow-up diabetic visit. He has type 1 diabetes mellitus. Onset time: He was diagnosed at approximate age of 8 years. His disease course has been improving. There are no hypoglycemic associated symptoms. Pertinent negatives for hypoglycemia include no confusion, headaches, pallor or seizures. Pertinent negatives for diabetes include no chest pain, no fatigue, no polydipsia, no polyphagia, no polyuria and no weakness. There are no hypoglycemic complications. Symptoms are stable. There are no diabetic complications. Risk factors for coronary artery disease include dyslipidemia, diabetes mellitus, hypertension, family history, male sex, tobacco exposure and sedentary lifestyle. Current diabetic treatment includes insulin  pump. He is compliant with treatment most of the  time. His weight is fluctuating minimally. He is following a generally unhealthy diet. When asked about meal planning, he reported none. He has not had a previous visit with a dietitian. He never participates in exercise. His home blood glucose trend is fluctuating minimally. His overall blood glucose range is 140-180 mg/dl. (He presents today with his CGM and Omnipod 5 combo showing stable, mostly at target glycemic profile.  His POCT A1c today is 7%,improving from last visit of 7.1%.  Analysis of his CGM shows TIR 56%, TAR 43%, TBR 1% with a GMI  of 7.5%.  He has had trouble getting his CGM to connect to his PDM.) An ACE inhibitor/angiotensin II receptor blocker is being taken. He sees a podiatrist.Eye exam is current.  Hyperlipidemia This is a chronic problem. The current episode started more than 1 year ago. The problem is uncontrolled. Recent lipid tests were reviewed and are variable. Exacerbating diseases include diabetes and obesity. Factors aggravating his hyperlipidemia include fatty foods. Pertinent negatives include no chest pain, myalgias or shortness of breath. Current antihyperlipidemic treatment includes statins. The current treatment provides mild improvement of lipids. Compliance problems include adherence to diet and adherence to exercise.  Risk factors for coronary artery disease include diabetes mellitus, dyslipidemia, hypertension, male sex, a sedentary lifestyle and obesity.  Hypertension This is a chronic problem. The current episode started more than 1 year ago. The problem has been gradually improving since onset. The problem is controlled. Pertinent negatives include no chest pain, headaches, neck pain, palpitations or shortness of breath. There are no associated agents to hypertension. Risk factors for coronary artery disease include diabetes mellitus, dyslipidemia, male gender, smoking/tobacco exposure, sedentary lifestyle and obesity. Past treatments include ACE inhibitors and  diuretics. The current treatment provides mild improvement. Compliance problems include exercise and diet.     Review of systems  Constitutional: + minimally fluctuating body weight,  current Body mass index is 29.82 kg/m. , + fatigue, no subjective hyperthermia, no subjective hypothermia Eyes: no blurry vision, no xerophthalmia ENT: no sore throat, no nodules palpated in throat, no dysphagia/odynophagia, no hoarseness Cardiovascular: no chest pain, no shortness of breath, no palpitations, no leg swelling Respiratory: no cough, no shortness of breath Gastrointestinal: no nausea/vomiting/diarrhea Musculoskeletal: no muscle/joint aches, generalized joint swelling Skin: no rashes, no hyperemia Neurological: no tremors, no numbness, no tingling, no dizziness Psychiatric: no depression, no anxiety   Objective:    BP 131/74   Pulse 81   Ht 6' 3 (1.905 m)   Wt 238 lb 9.6 oz (108.2 kg)   SpO2 99%   BMI 29.82 kg/m   Wt Readings from Last 3 Encounters:  11/15/23 238 lb 9.6 oz (108.2 kg)  04/26/23 229 lb (103.9 kg)  01/11/23 228 lb 12.8 oz (103.8 kg)    BP Readings from Last 3 Encounters:  11/15/23 131/74  07/05/23 104/66  04/26/23 (!) 112/58     Physical Exam- Limited  Constitutional:  Body mass index is 29.82 kg/m. , not in acute distress, normal state of mind Eyes:  EOMI, no exophthalmos Musculoskeletal: no gross deformities, strength intact in all four extremities, no gross restriction of joint movements Skin:  no rashes, no hyperemia Neurological: no tremor with outstretched hands   Diabetic Foot Exam - Simple   No data filed       Recent Results (from the past 2160 hours)  Comprehensive metabolic panel     Status: Abnormal   Collection Time: 11/09/23  2:50 PM  Result Value Ref Range   Glucose 161 (H) 70 - 99 mg/dL   BUN 22 6 - 24 mg/dL   Creatinine, Ser 8.85 0.76 - 1.27 mg/dL   eGFR 78 >40 fO/fpw/8.26   BUN/Creatinine Ratio 19 9 - 20   Sodium 138 134 - 144  mmol/L   Potassium 4.7 3.5 - 5.2 mmol/L   Chloride 101 96 - 106 mmol/L   CO2 23 20 - 29 mmol/L   Calcium  9.4 8.7 - 10.2 mg/dL   Total Protein 6.8 6.0 - 8.5 g/dL   Albumin 4.2 4.1 - 5.1  g/dL   Globulin, Total 2.6 1.5 - 4.5 g/dL   Bilirubin Total 0.2 0.0 - 1.2 mg/dL   Alkaline Phosphatase 78 44 - 121 IU/L    Comment: **Effective November 15, 2023 Alkaline Phosphatase**   reference interval will be changing to:              Age                Male          Male           0 -  5 days         47 - 127       47 - 127           6 - 10 days         29 - 242       29 - 242          11 - 20 days        109 - 357      109 - 357          21 - 30 days         94 - 494       94 - 494           1 -  2 months      149 - 539      149 - 539           3 -  6 months      131 - 452      131 - 452           7 - 11 months      117 - 401      117 - 401   12 months -  6 years       158 - 369      158 - 369           7 - 12 years       150 - 409      150 - 409               13 years       156 - 435       78 - 227               14 years       114 - 375       64 - 161               15 years        88 - 279       56 - 134               16 years        74 - 207       51 - 121               17 years        63 - 161       47 - 113          18 - 20 years        51 - 125       42 - 106          21 - 50 years         44 - 123  41 - 116          51 - 80 years        49 - 135       51 - 125              >80 years        48 - 129       48 - 129    AST 20 0 - 40 IU/L   ALT 23 0 - 44 IU/L  Lipid panel     Status: Abnormal   Collection Time: 11/09/23  2:50 PM  Result Value Ref Range   Cholesterol, Total 189 100 - 199 mg/dL   Triglycerides 656 (H) 0 - 149 mg/dL   HDL 38 (L) >60 mg/dL   VLDL Cholesterol Cal 57 (H) 5 - 40 mg/dL   LDL Chol Calc (NIH) 94 0 - 99 mg/dL   Chol/HDL Ratio 5.0 0.0 - 5.0 ratio    Comment:                                   T. Chol/HDL Ratio                                              Men  Women                               1/2 Avg.Risk  3.4    3.3                                   Avg.Risk  5.0    4.4                                2X Avg.Risk  9.6    7.1                                3X Avg.Risk 23.4   11.0   TSH     Status: None   Collection Time: 11/09/23  2:50 PM  Result Value Ref Range   TSH 2.410 0.450 - 4.500 uIU/mL  T4, free     Status: None   Collection Time: 11/09/23  2:50 PM  Result Value Ref Range   Free T4 1.10 0.82 - 1.77 ng/dL  VITAMIN D  25 Hydroxy (Vit-D Deficiency, Fractures)     Status: None   Collection Time: 11/09/23  2:50 PM  Result Value Ref Range   Vit D, 25-Hydroxy 47.4 30.0 - 100.0 ng/mL    Comment: Vitamin D  deficiency has been defined by the Institute of Medicine and an Endocrine Society practice guideline as a level of serum 25-OH vitamin D  less than 20 ng/mL (1,2). The Endocrine Society went on to further define vitamin D  insufficiency as a level between 21 and 29 ng/mL (2). 1. IOM (Institute of Medicine). 2010. Dietary reference    intakes for calcium  and D. [place]: The    [employer]. 2. Holick MF, Binkley [place], Bischoff-Ferrari HA, et al.    Evaluation, treatment, and  prevention of vitamin D     deficiency: an Endocrine Society clinical practice    guideline. JCEM. 2011 Jul; 96(7):1911-30.   POCT UA - Microalbumin     Status: None   Collection Time: 11/15/23  3:58 PM  Result Value Ref Range   Microalbumin Ur, POC 10 mg/L   Creatinine, POC 300 mg/dL   Albumin/Creatinine Ratio, Urine, POC <30   POCT glycosylated hemoglobin (Hb A1C)     Status: Abnormal   Collection Time: 11/15/23  4:02 PM  Result Value Ref Range   Hemoglobin A1C 7.0 (A) 4.0 - 5.6 %   HbA1c POC (<> result, manual entry)     HbA1c, POC (prediabetic range)     HbA1c, POC (controlled diabetic range)          Assessment & Plan:   1) Controlled type 1 diabetes mellitus without complications (HCC)  - Jesse Rodgers has currently  uncontrolled symptomatic type 1 DM since 48 years of age.  He denies any history of diabetes ketoacidosis.  He presents today with his CGM and Omnipod 5 combo showing stable, mostly at target glycemic profile.  His POCT A1c today is 7%,improving from last visit of 7.1%.  Analysis of his CGM shows TIR 56%, TAR 43%, TBR 1% with a GMI of 7.5%.  He has had trouble getting his CGM to connect to his PDM.  -his diabetes is complicated by chronic heavy smoking and Jesse Rodgers remains at a high risk for more acute and chronic complications which include CAD, CVA, CKD, retinopathy, and neuropathy. These are all discussed in detail with the patient.  The following Lifestyle Medicine recommendations according to American College of Lifestyle Medicine [facility]) were discussed and offered to patient and he agrees to start the journey:  A. Whole Foods, Plant-based plate comprising of fruits and vegetables, plant-based proteins, whole-grain carbohydrates was discussed in detail with the patient.   A list for source of those nutrients were also provided to the patient.  Patient will use only water or unsweetened tea for hydration. B.  The need to stay away from risky substances including alcohol, smoking; obtaining 7 to 9 hours of restorative sleep, at least 150 minutes of moderate intensity exercise weekly, the importance of healthy social connections,  and stress reduction techniques were discussed. C.  A full color page of  Calorie density of various food groups per pound showing examples of each food groups was provided to the patient.  - Nutritional counseling repeated at each appointment due to patients tendency to fall back in to old habits.  - The patient admits there is a room for improvement in their diet and drink choices. -  Suggestion is made for the patient to avoid simple carbohydrates from their diet including Cakes, Sweet Desserts / Pastries, Ice Cream, Soda (diet and regular), Sweet Tea, Candies,  Chips, Cookies, Sweet Pastries, Store Bought Juices, Alcohol in Excess of 1-2 drinks a day, Artificial Sweeteners, Coffee Creamer, and Sugar-free Products. This will help patient to have stable blood glucose profile and potentially avoid unintended weight gain.   - I encouraged the patient to switch to unprocessed or minimally processed complex starch and increased protein intake (animal or plant source), fruits, and vegetables.   - Patient is advised to stick to a routine mealtimes to eat 3 meals a day and avoid unnecessary snacks (to snack only to correct hypoglycemia).  - I have approached him with the following individualized plan to manage diabetes and patient agrees:   -  He has clearly benefited from his CGM device, he is advised to continue using it.  -He is doing well with his current Omnipod 5 settings, therefore, I did not make any changes to his pump settings today.  I did help him download the app on his phone which may alleviate the communication issue he has had between his CGM and pump PDM.  -He is advised to continue monitoring blood glucose 4 times daily, before meals and before bedtime using his CGM, and call the clinic if he has readings less than 70 or greater than 300 for 3 tests in a row.   - Patient is warned not to take insulin  without proper monitoring per orders. -Adjustment parameters are given for hypo and hyperglycemia in writing.  - Patient specific target  A1c;  LDL, HDL, Triglycerides  were discussed in detail.  2) BP/HTN:  Blood pressure is controlled to target.  He is advised to continue his current blood pressure medications including Losartan  25 mg p.o. daily and Lasix 20 mg po daily.    3) Lipids/HPL:  His most recent lipid panel from 11/09/23 shows high triglycerides of 343, improving.  He is advised to continue Crestor  20 mg po daily at bedtime and his Omega 3 Fatty acids.    Also advised him to avoid fried foods and butter.   I also started him on  Fenofibrate  145 mg po daily.  He had run out between visits for a while due to pharmacy issues.  4)  Weight/Diet: His Body mass index is 29.82 kg/m.-not a candidate for major weight loss.  CDE Consult has been initiated , exercise, and detailed carbohydrates information provided.  5) Chronic Care/Health Maintenance: -he is on ARB and Statin medications and is encouraged to initiate and continue to follow up with Ophthalmology, Dentist,  Podiatrist at least yearly or according to recommendations, and he is advised to stay away from smoking.  I have recommended yearly flu vaccine and pneumonia vaccine at least every 5 years; moderate intensity exercise for up to 150 minutes weekly; and sleep for at least 7 hours a day.  - I advised patient to maintain close follow up with Bertell Satterfield, MD for primary care needs.     I spent  45  minutes in the care of the patient today including review of labs from CMP, Lipids, Thyroid  Function, Hematology (current and previous including abstractions from other facilities); face-to-face time discussing  his blood glucose readings/logs, discussing hypoglycemia and hyperglycemia episodes and symptoms, medications doses, his options of short and long term treatment based on the latest standards of care / guidelines;  discussion about incorporating lifestyle medicine;  and documenting the encounter. Risk reduction counseling performed per USPSTF guidelines to reduce obesity and cardiovascular risk factors.     Please refer to Patient Instructions for Blood Glucose Monitoring and Insulin /Medications Dosing Guide  in media tab for additional information. Please  also refer to  Patient Self Inventory in the Media  tab for reviewed elements of pertinent patient history.  Jesse Rodgers participated in the discussions, expressed understanding, and voiced agreement with the above plans.  All questions were answered to his satisfaction. he is encouraged to contact  clinic should he have any questions or concerns prior to his return visit.    Follow up plan: - Return in about 6 months (around 05/14/2024) for Diabetes F/U with A1c in office, No previsit labs, Bring meter and logs.  Benton Rio, FNP-BC [facility] [address]  [address] [place], [place] [address] Phone: [phone] Fax: [phone]  11/15/2023, 4:32 PM

## 2023-12-09 DIAGNOSIS — E1142 Type 2 diabetes mellitus with diabetic polyneuropathy: Secondary | ICD-10-CM | POA: Diagnosis not present

## 2023-12-30 DIAGNOSIS — M545 Low back pain, unspecified: Secondary | ICD-10-CM | POA: Diagnosis not present

## 2023-12-30 DIAGNOSIS — Z79899 Other long term (current) drug therapy: Secondary | ICD-10-CM | POA: Diagnosis not present

## 2023-12-30 DIAGNOSIS — L4050 Arthropathic psoriasis, unspecified: Secondary | ICD-10-CM | POA: Diagnosis not present

## 2023-12-30 DIAGNOSIS — M25561 Pain in right knee: Secondary | ICD-10-CM | POA: Diagnosis not present

## 2024-01-31 ENCOUNTER — Ambulatory Visit

## 2024-02-05 ENCOUNTER — Other Ambulatory Visit: Payer: Self-pay | Admitting: Nurse Practitioner

## 2024-02-05 ENCOUNTER — Other Ambulatory Visit: Payer: Self-pay | Admitting: Cardiovascular Disease

## 2024-02-14 ENCOUNTER — Encounter (HOSPITAL_COMMUNITY): Payer: Self-pay

## 2024-02-14 ENCOUNTER — Ambulatory Visit (HOSPITAL_COMMUNITY)

## 2024-02-14 DIAGNOSIS — M6281 Muscle weakness (generalized): Secondary | ICD-10-CM | POA: Insufficient documentation

## 2024-02-14 DIAGNOSIS — M5459 Other low back pain: Secondary | ICD-10-CM | POA: Insufficient documentation

## 2024-02-14 NOTE — Therapy (Signed)
 OUTPATIENT PHYSICAL THERAPY THORACOLUMBAR EVALUATION   Patient Name: Jesse Rodgers MRN: [id number] DOB:11/18/1975, 48 y.o., male Today's Date: 02/14/2024  END OF SESSION:  PT End of Session - 02/14/24 1508     Visit Number 1    Number of Visits 12    Date for Recertification  04/28/24    Authorization Type BC[employer]    Authorization Time Period Seeking authorization    PT Start Time 1508    PT Stop Time 1546    PT Time Calculation (min) 38 min    Activity Tolerance Patient tolerated treatment well    Behavior During Therapy WFL for tasks assessed/performed          Past Medical History:  Diagnosis Date   Arthritis    back    Depression    takes Zoloft  daily   Diabetes mellitus without complication (HCC)    takes Toujeo  and Novolog  nightly;average fasting blood sugar runs 150   History of kidney stones    Hyperlipidemia    takes Simvastatin  daily   Hypertension    takes Losartan  daily   Past Surgical History:  Procedure Laterality Date   ABDOMINAL SURGERY     APPENDECTOMY     BACK SURGERY     DENTAL SURGERY  12/10/2014   molar tooth #  14,15,16  bone biopsy  of ramus   PARTIAL COLECTOMY     TOOTH EXTRACTION N/A 12/10/2014   Procedure: EXTRACTION MOLARS #14,15,16  INTRAORAL AND EXTRAORAL INCISION AND DRAINAGE AND BONE BIOPSY OF LEFT RAMUS;  Surgeon: Lonni Sax, DDS;  Location: [facility] OR;  Service: Oral Surgery;  Laterality: N/A;   Patient Active Problem List   Diagnosis Date Noted   DOE (dyspnea on exertion) 02/13/2022   OSA (obstructive sleep apnea) 02/13/2022   Chest pain 08/05/2021   Elevated troponin 08/05/2021   Diabetes mellitus type 1 (HCC) 08/05/2021   Rash 08/05/2021   Elevated LFTs 08/05/2021   Uncontrolled type 1 diabetes mellitus with hyperglycemia (HCC) 09/28/2017   Tobacco abuse 09/28/2017   Myositis 10/24/2014   Odontogenic infection of jaw 10/23/2014   Dental infection 10/23/2014   Essential hypertension, benign 10/23/2014   Mixed  hyperlipidemia 10/23/2014   Depression 10/23/2014   Dehydration 10/23/2014   DIABETES 05/15/2008   ANKLE PAIN, RIGHT 05/15/2008   Sprain of ankle 05/15/2008    PCP: Bertell Satterfield, MD   REFERRING PROVIDER: Alverda Mardy SAUNDERS, MD   REFERRING DIAG: Low back pain, unspecified   Rationale for Evaluation and Treatment: Rehabilitation  THERAPY DIAG:  Other low back pain  Muscle weakness (generalized)  ONSET DATE: 30 years  SUBJECTIVE:  SUBJECTIVE STATEMENT: Patient reports that his back has been bothering him for about 30 years now. He has noticed that it is worse in cold weather and it has been gradually getting worse over the years. He gets really stiff if he is still for long periods of time.   PERTINENT HISTORY:  Hypertension, diabetes type 1, arthritis, and previous lumbar surgery  PAIN:  Are you having pain? Yes: NPRS scale: current: 1/10 Best: 1/10 Worst: 7-8/10 Pain location: mid low back  Pain description: dull, stiffness  Aggravating factors: cold weather, prolonged standing, bending over  Relieving factors: walking  PRECAUTIONS: None  RED FLAGS: None   WEIGHT BEARING RESTRICTIONS: No  FALLS:  Has patient fallen in last 6 months? 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  PATIENT SURVEYS:  Modified Oswestry:  MODIFIED OSWESTRY DISABILITY SCALE  Date: 02/14/24 Score  Pain intensity 4 =  Pain  medication provides me with little relief from pain.  2. Personal care (washing, dressing, etc.) 3 =  I need help, but I am able to manage most of my personal care.  3. Lifting 1 = I can lift heavy weights, but it causes increased pain.  4. Walking 1 = Pain prevents me from walking more than 1 mile.  5. Sitting 3 =  Pain prevents me from sitting more than  hour.  6. Standing 4 =  Pain prevents me from standing more than 10 minutes.  7. Sleeping 0 = Pain does not prevent me from sleeping well.  8. Social Life 2 = Pain prevents me from participating in more energetic activities (eg. sports, dancing).  9. Traveling 1 =  I can travel anywhere, but it increases my pain.  10. Employment/ Homemaking 2 = I can perform most of my homemaking/job duties, but pain prevents me from performing more physically stressful activities (eg, lifting, vacuuming).  Total 21/50   Interpretation of scores: Score Category Description  0-20% Minimal Disability The patient can cope with most living activities. Usually no treatment is indicated apart from advice on lifting, sitting and exercise  21-40% Moderate Disability The patient experiences more pain and difficulty with sitting, lifting and standing. Travel and social life are more difficult and they may be disabled from work. Personal care, sexual activity and sleeping are not grossly affected, and the patient can usually be managed by conservative means  41-60% Severe Disability Pain remains the main problem in this group, but activities of daily living are affected. These patients require a detailed investigation  61-80% Crippled Back pain impinges on all aspects of the patients life. Positive intervention is required  81-100% Bed-bound These patients are either bed-bound or exaggerating their symptoms  Bluford FORBES Zoe DELENA Karon DELENA, et al. Surgery versus conservative management of stable thoracolumbar fracture: the PRESTO feasibility RCT. [place] ([place]): [employer]; 2021 Nov. [facility] Technology Assessment, No. 25.62.) Appendix 3, Oswestry Disability Index category descriptors. Available from: Findjewelers.cz  Minimally Clinically Important Difference (MCID) = 12.8%  COGNITION: Overall cognitive status: Within functional limits for tasks assessed     SENSATION: Patient reports no numbness or tingling in lower extremities  POSTURE: forward head  PALPATION: Increased tone noticed along bilateral lumbar paraspinals   LUMBAR ROM:   AROM eval  Flexion 50% limited; slight pain  Extension 50% limited  Right lateral flexion 25% limited   Left lateral flexion 50% limited  Right rotation 25% limited   Left rotation 25% limited   (Blank rows = not tested)  LOWER EXTREMITY ROM:  WFL for activities assessed   LOWER EXTREMITY MMT:    MMT Right eval Left eval  Hip flexion 4/5 4-/5  Hip extension    Hip abduction    Hip adduction    Hip internal rotation    Hip external rotation    Knee flexion 4/5; knee pain  4/5  Knee extension 5/5 4+/5  Ankle dorsiflexion 4/5 4/5  Ankle plantarflexion    Ankle inversion    Ankle eversion     (Blank rows = not tested)  FUNCTIONAL TESTS:  5 times sit to stand: to be assessed at next appointment, as able Timed up and go (TUG): to be assessed at next appointment, as able 2 minute walk test: to be assessed at next appointment, as able  GAIT: Assistive device utilized: None Level of assistance: Complete Independence Comments: Decreased gait speed and stride length  TREATMENT DATE:                                                                                                                               02/14/24: PT evaluation, patient education, and HEP    PATIENT EDUCATION:  Education details: Plan of care, prognosis, healing, objective findings, anatomy, HEP, and goals for physical therapy Person educated: Patient Education method: Explanation,  Demonstration, and Handouts Education comprehension: verbalized understanding and returned demonstration  HOME EXERCISE PROGRAM: Access Code: [email or web address] URL: [email or web address] Date: 02/14/2024 Prepared by: Lacinda Fass  Exercises - Supine Lower Trunk Rotation  - 1-2 x daily - 7 x weekly - 3 sets - 10 reps - Supine Piriformis Stretch with Foot on Ground  - 1-2 x daily - 7 x weekly - 3 sets - 30 seconds hold  ASSESSMENT:  CLINICAL IMPRESSION: Patient is a 48 y.o. male who was seen today for physical therapy evaluation and treatment for chronic low back pain.  He presented with low pain severity and irritability with lumbar joint mobility assessments to L1-5 reproducing his familiar symptoms.  He also exhibited reduced lumbar joint mobility and active range of motion.  Recommend that he continue with skilled physical therapy to address his impairments to maximize his functional mobility.  OBJECTIVE IMPAIRMENTS: decreased activity tolerance, decreased mobility, decreased ROM, decreased strength, hypomobility, impaired flexibility, impaired tone, and pain.   ACTIVITY LIMITATIONS: lifting, bending, sitting, standing, squatting, stairs, transfers, bed mobility, bathing, and locomotion level  PARTICIPATION LIMITATIONS: shopping, community activity, occupation, and yard work  PERSONAL FACTORS: Past/current experiences, Profession, Time since onset of injury/illness/exacerbation, and 3+ comorbidities: Hypertension, diabetes type 1, arthritis, and previous lumbar surgery are also affecting  patient's functional outcome.   REHAB POTENTIAL: Good  CLINICAL DECISION MAKING: Evolving/moderate complexity  EVALUATION COMPLEXITY: Moderate   GOALS: Goals reviewed with patient? Yes  SHORT TERM GOALS: Target date: 03/06/24  Patient will be independent with his initial HEP. Baseline: Goal status: INITIAL  2.  Patient will be able to demonstrate proper lifting mechanics for improved  function with his critical job demands. Baseline:  Goal status: INITIAL  3.  Patient will be able to complete his daily activities without his familiar symptoms exceeding 4/10. Baseline:  Goal status: INITIAL a LONG TERM GOALS: Target date: 03/27/24  Patient will be independent with his advanced HEP. Baseline:  Goal status: INITIAL  2.  Patient will improve his ODI score to 15/50 or less for improved perceived function with his daily activities. Baseline:  Goal status: INITIAL  3.  Patient will be able to squat and lift at least 40 pounds without being limited by his familiar symptoms for improved function with his critical job demands. Baseline:  Goal status: INITIAL  4.  Patient will be able to navigate at least 4 steps with a reciprocal pattern for improved household mobility. Baseline:  Goal status: INITIAL  PLAN:  PT FREQUENCY: 1-2x/week  PT DURATION: 6 weeks  PLANNED INTERVENTIONS: 97164- PT Re-evaluation, 97750- Physical Performance Testing, 97110-Therapeutic exercises, 97530- Therapeutic activity, [id number]- Neuromuscular re-education, 97535- Self Care, 02859- Manual therapy, [id number]- Gait training, [phone]- Electrical stimulation (unattended), [id number]- Traction (mechanical), 20560 (1-2 muscles), 20561 (3+ muscles)- Dry Needling, Patient/Family education, Balance training, Stair training, Taping, Joint mobilization, Spinal mobilization, Cryotherapy, and Moist heat.  PLAN FOR NEXT SESSION: Functional testing, review and update HEP, lumbar and lower extremity strengthening, and balance interventions   Lacinda JAYSON Fass, PT 03-03-24, 6:32 PM    Managed Medicaid Authorization Request Treatment Start Date: March 03, 2024  Visit Dx Codes: M54.59, M54.59   Functional Tool Score: ODI: 21/50  For all possible CPT codes, reference the Planned Interventions line above.     Check all conditions that are expected to impact treatment: {Conditions expected to impact treatment:Diabetes  mellitus   If treatment provided at initial evaluation, no treatment charged due to lack of authorization.

## 2024-03-06 ENCOUNTER — Telehealth

## 2024-03-06 DIAGNOSIS — I10 Essential (primary) hypertension: Secondary | ICD-10-CM

## 2024-03-06 DIAGNOSIS — E782 Mixed hyperlipidemia: Secondary | ICD-10-CM | POA: Diagnosis not present

## 2024-03-06 DIAGNOSIS — F331 Major depressive disorder, recurrent, moderate: Secondary | ICD-10-CM | POA: Diagnosis not present

## 2024-03-06 DIAGNOSIS — R0789 Other chest pain: Secondary | ICD-10-CM

## 2024-03-06 MED ORDER — TIZANIDINE HCL 4 MG PO TABS: 4.0000 mg | ORAL_TABLET | Freq: Three times a day (TID) | ORAL | 0 refills | Status: AC | PRN

## 2024-03-06 MED ORDER — SERTRALINE HCL 50 MG PO TABS: ORAL_TABLET | ORAL | 0 refills | Status: AC

## 2024-03-06 MED ORDER — METOPROLOL TARTRATE 25 MG PO TABS: 25.0000 mg | ORAL_TABLET | Freq: Two times a day (BID) | ORAL | 0 refills | Status: AC

## 2024-03-06 MED ORDER — FENOFIBRATE 145 MG PO TABS: 145.0000 mg | ORAL_TABLET | Freq: Every day | ORAL | 0 refills | Status: AC

## 2024-03-06 MED ORDER — OLMESARTAN MEDOXOMIL 5 MG PO TABS: 5.0000 mg | ORAL_TABLET | Freq: Every day | ORAL | 0 refills | Status: AC

## 2024-03-06 NOTE — Patient Instructions (Signed)
 " Jesse Rodgers, thank you for joining Delon CHRISTELLA Dickinson, PA-C for today's virtual visit.  While this provider is not your primary care provider (PCP), if your PCP is located in our provider database this encounter information will be shared with them immediately following your visit.   A [facility] MyChart account gives you access to today's visit and all your visits, tests, and labs performed at [facility]  click here if you don't have a [facility] MyChart account or go to [email or web address]  Consent: (Patient) Jesse Rodgers provided verbal consent for this virtual visit at the beginning of the encounter.  Current Medications:  Current Outpatient Medications:    sertraline  (ZOLOFT ) 50 MG tablet, Start with 0.5 tablet (25mg ) x 1 week, then increase to 1 tablet(50mg ) x 2 weeks, then increase to 2 tablets (100mg ) daily, Disp: 180 tablet, Rfl: 0   Continuous Blood Gluc Sensor (DEXCOM G6 SENSOR) MISC, Check glucose continuously, Disp: 3 each, Rfl: 2   Continuous Blood Gluc Sensor (DEXCOM G6 SENSOR) MISC, APPLY 1 SENSOR TO SKIN TO MONITOR GLUCOSE CONTINUOUSLY AS DIRECTED. CHANGE SENSOR EVERY 10 DAYS., Disp: 3 each, Rfl: 2   Continuous Glucose Sensor (DEXCOM G6 SENSOR) MISC, Apply new sensor every 10 days as directed., Disp: 9 each, Rfl: 3   Continuous Glucose Sensor (DEXCOM G7 SENSOR) MISC, Inject 1 Application into the skin as directed. Change sensor every 10 days as directed., Disp: 9 each, Rfl: 3   Continuous Glucose Transmitter (DEXCOM G6 TRANSMITTER) MISC, USE TO TEST BLOOD GLUCOSE 4-5 TIMES DAILY, Disp: 1 each, Rfl: 3   fenofibrate  (TRICOR ) 145 MG tablet, Take 1 tablet (145 mg total) by mouth daily., Disp: 90 tablet, Rfl: 0   fluticasone  (FLONASE ) 50 MCG/ACT nasal spray, Place 2 sprays into both nostrils daily., Disp: 16 g, Rfl: 0   Glucagon , rDNA, (GLUCAGON  EMERGENCY) 1 MG KIT, Inject 1 mg into the muscle once as needed., Disp: 1 kit, Rfl: 1   insulin  aspart  (NOVOLOG ) 100 UNIT/ML injection, USE WITH OMNIPOD FOR TOTAL DAILY DOSE AROUND 110 UNITS, Disp: 100 mL, Rfl: 3   Insulin  Disposable Pump (OMNIPOD 5 DEXG7G6 PODS GEN 5) MISC, CHANGE POD EVERY 2-3 DAYS, Disp: 9 each, Rfl: 6   Insulin  Disposable Pump (OMNIPOD 5 G6 INTRO, GEN 5,) KIT, Change pod every 48-72 hrs, Disp: 1 kit, Rfl: 0   metoprolol  tartrate (LOPRESSOR ) 25 MG tablet, Take 1 tablet (25 mg total) by mouth 2 (two) times daily., Disp: 180 tablet, Rfl: 0   Multiple Vitamin (MULTIVITAMIN WITH MINERALS) TABS tablet, Take 1 tablet by mouth daily., Disp: , Rfl:    NON FORMULARY, Taking Total Beet product daily, Disp: , Rfl:    olmesartan  (BENICAR ) 5 MG tablet, Take 1 tablet (5 mg total) by mouth daily., Disp: 90 tablet, Rfl: 0   OTEZLA 30 MG TABS, Take 1 tablet by mouth 2 (two) times daily., Disp: , Rfl:    rosuvastatin  (CRESTOR ) 20 MG tablet, TAKE ONE TABLET BY MOUTH EVERY DAY, Disp: 90 tablet, Rfl: 0   tiZANidine  (ZANAFLEX ) 4 MG tablet, Take 1 tablet (4 mg total) by mouth every 8 (eight) hours as needed for muscle spasms., Disp: 30 tablet, Rfl: 0   Medications ordered in this encounter:  Meds ordered this encounter  Medications   sertraline  (ZOLOFT ) 50 MG tablet    Sig: Start with 0.5 tablet (25mg ) x 1 week, then increase to 1 tablet(50mg ) x 2 weeks, then increase to 2 tablets (100mg ) daily  Dispense:  180 tablet    Refill:  0    Supervising Provider:   LAMPTEY, PHILIP O [[id number]]   olmesartan  (BENICAR ) 5 MG tablet    Sig: Take 1 tablet (5 mg total) by mouth daily.    Dispense:  90 tablet    Refill:  0    Supervising Provider:   LAMPTEY, PHILIP O [[id number]]   metoprolol  tartrate (LOPRESSOR ) 25 MG tablet    Sig: Take 1 tablet (25 mg total) by mouth 2 (two) times daily.    Dispense:  180 tablet    Refill:  0    Supervising Provider:   LAMPTEY, PHILIP O [[id number]]   fenofibrate  (TRICOR ) 145 MG tablet    Sig: Take 1 tablet (145 mg total) by mouth daily.    Dispense:  90 tablet    Refill:   0    Supervising Provider:   LAMPTEY, PHILIP O [[id number]]   tiZANidine  (ZANAFLEX ) 4 MG tablet    Sig: Take 1 tablet (4 mg total) by mouth every 8 (eight) hours as needed for muscle spasms.    Dispense:  30 tablet    Refill:  0    Supervising Provider:   LAMPTEY, PHILIP O [[id number]]     *If you need refills on other medications prior to your next appointment, please contact your pharmacy*  Follow-Up: Call back or seek an in-person evaluation if the symptoms worsen or if the condition fails to improve as anticipated.  [facility] [phone]   If you have been instructed to have an in-person evaluation today at a local Urgent Care facility, please use the link below. It will take you to a list of all of our available [facility], including address, phone number and hours of operation. Please do not delay care.  [facility]  If you or a family member do not have a primary care provider, use the link below to schedule a visit and establish care. When you choose a [facility] primary care physician or advanced practice provider, you gain a long-term partner in health. Find a Primary Care Provider  Learn more about [facility] in-office and virtual care options:  - Get Care Now "

## 2024-03-06 NOTE — Progress Notes (Signed)
 " Virtual Visit Consent   WELLS MABE, you are scheduled for a virtual visit with a [facility] provider today. Just as with appointments in the office, your consent must be obtained to participate. Your consent will be active for this visit and any virtual visit you may have with one of our providers in the next 365 days. If you have a MyChart account, a copy of this consent can be sent to you electronically.  As this is a virtual visit, video technology does not allow for your provider to perform a traditional examination. This may limit your provider's ability to fully assess your condition. If your provider identifies any concerns that need to be evaluated in person or the need to arrange testing (such as labs, EKG, etc.), we will make arrangements to do so. Although advances in technology are sophisticated, we cannot ensure that it will always work on either your end or our end. If the connection with a video visit is poor, the visit may have to be switched to a telephone visit. With either a video or telephone visit, we are not always able to ensure that we have a secure connection.  By engaging in this virtual visit, you consent to the provision of healthcare and authorize for your insurance to be billed (if applicable) for the services provided during this visit. Depending on your insurance coverage, you may receive a charge related to this service.  I need to obtain your verbal consent now. Are you willing to proceed with your visit today? Jesse Rodgers has provided verbal consent on 03/06/2024 for a virtual visit (video or telephone). Delon CHRISTELLA Dickinson, PA-C  Date: 03/06/2024 9:42 AM   Virtual Visit via Video Note   I, Delon CHRISTELLA Dickinson, connected with  Jesse Rodgers  ([id number], 11-07-1975) on 03/06/2024 at  9:30 AM EST by a video-enabled telemedicine application and verified that I am speaking with the correct person using two identifiers.  Location: Patient: Virtual Visit  Location Patient: Home Provider: Virtual Visit Location Provider: Home Office   I discussed the limitations of evaluation and management by telemedicine and the availability of in person appointments. The patient expressed understanding and agreed to proceed.    History of Present Illness: Jesse Rodgers is a 49 y.o. who identifies as a male who was assigned male at birth, and is being seen today for medication refills.  Previous patient at [facility] but has not been seen for a while there. Is a T1DM and followed by Endocrinology for that.   Has seen Cardiology as well, but his provider moved and has not established with the new cardiologist yet.    Problems:  Patient Active Problem List   Diagnosis Date Noted   DOE (dyspnea on exertion) 02/13/2022   OSA (obstructive sleep apnea) 02/13/2022   Chest pain 08/05/2021   Elevated troponin 08/05/2021   Diabetes mellitus type 1 (HCC) 08/05/2021   Rash 08/05/2021   Elevated LFTs 08/05/2021   Uncontrolled type 1 diabetes mellitus with hyperglycemia (HCC) 09/28/2017   Tobacco abuse 09/28/2017   Myositis 10/24/2014   Odontogenic infection of jaw 10/23/2014   Dental infection 10/23/2014   Essential hypertension, benign 10/23/2014   Mixed hyperlipidemia 10/23/2014   Depression 10/23/2014   Dehydration 10/23/2014   DIABETES 05/15/2008   ANKLE PAIN, RIGHT 05/15/2008   Sprain of ankle 05/15/2008    Allergies: Allergies[1] Medications: Current Medications[2]  Observations/Objective: Patient is well-developed, well-nourished in no acute distress.  Resting comfortably at  home.  Head is normocephalic, atraumatic.  No labored breathing.  Speech is clear and coherent with logical content.  Patient is alert and oriented at baseline.    Assessment and Plan: 1. Essential hypertension, benign (Primary) - olmesartan  (BENICAR ) 5 MG tablet; Take 1 tablet (5 mg total) by mouth daily.  Dispense: 90 tablet; Refill: 0 - metoprolol  tartrate  (LOPRESSOR ) 25 MG tablet; Take 1 tablet (25 mg total) by mouth 2 (two) times daily.  Dispense: 180 tablet; Refill: 0  2. Moderate episode of recurrent major depressive disorder (HCC) - sertraline  (ZOLOFT ) 50 MG tablet; Start with 0.5 tablet (25mg ) x 1 week, then increase to 1 tablet(50mg ) x 2 weeks, then increase to 2 tablets (100mg ) daily  Dispense: 180 tablet; Refill: 0  3. Mixed hyperlipidemia - fenofibrate  (TRICOR ) 145 MG tablet; Take 1 tablet (145 mg total) by mouth daily.  Dispense: 90 tablet; Refill: 0  4. Rib pain - tiZANidine  (ZANAFLEX ) 4 MG tablet; Take 1 tablet (4 mg total) by mouth every 8 (eight) hours as needed for muscle spasms.  Dispense: 30 tablet; Refill: 0  - Medications refilled x 90 days, advised needs to be seen before any further refills provided - Discussed using link in AVS to find a Primary Care Provider  Follow Up Instructions: I discussed the assessment and treatment plan with the patient. The patient was provided an opportunity to ask questions and all were answered. The patient agreed with the plan and demonstrated an understanding of the instructions.  A copy of instructions were sent to the patient via MyChart unless otherwise noted below.    The patient was advised to call back or seek an in-person evaluation if the symptoms worsen or if the condition fails to improve as anticipated.    Delon CHRISTELLA Dickinson, PA-C     [1]  Allergies Allergen Reactions   Bactrim [Sulfamethoxazole-Trimethoprim] Rash  [2]  Current Outpatient Medications:    sertraline  (ZOLOFT ) 50 MG tablet, Start with 0.5 tablet (25mg ) x 1 week, then increase to 1 tablet(50mg ) x 2 weeks, then increase to 2 tablets (100mg ) daily, Disp: 180 tablet, Rfl: 0   Continuous Blood Gluc Sensor (DEXCOM G6 SENSOR) MISC, Check glucose continuously, Disp: 3 each, Rfl: 2   Continuous Blood Gluc Sensor (DEXCOM G6 SENSOR) MISC, APPLY 1 SENSOR TO SKIN TO MONITOR GLUCOSE CONTINUOUSLY AS DIRECTED. CHANGE  SENSOR EVERY 10 DAYS., Disp: 3 each, Rfl: 2   Continuous Glucose Sensor (DEXCOM G6 SENSOR) MISC, Apply new sensor every 10 days as directed., Disp: 9 each, Rfl: 3   Continuous Glucose Sensor (DEXCOM G7 SENSOR) MISC, Inject 1 Application into the skin as directed. Change sensor every 10 days as directed., Disp: 9 each, Rfl: 3   Continuous Glucose Transmitter (DEXCOM G6 TRANSMITTER) MISC, USE TO TEST BLOOD GLUCOSE 4-5 TIMES DAILY, Disp: 1 each, Rfl: 3   fenofibrate  (TRICOR ) 145 MG tablet, Take 1 tablet (145 mg total) by mouth daily., Disp: 90 tablet, Rfl: 0   fluticasone  (FLONASE ) 50 MCG/ACT nasal spray, Place 2 sprays into both nostrils daily., Disp: 16 g, Rfl: 0   Glucagon , rDNA, (GLUCAGON  EMERGENCY) 1 MG KIT, Inject 1 mg into the muscle once as needed., Disp: 1 kit, Rfl: 1   insulin  aspart (NOVOLOG ) 100 UNIT/ML injection, USE WITH OMNIPOD FOR TOTAL DAILY DOSE AROUND 110 UNITS, Disp: 100 mL, Rfl: 3   Insulin  Disposable Pump (OMNIPOD 5 DEXG7G6 PODS GEN 5) MISC, CHANGE POD EVERY 2-3 DAYS, Disp: 9 each, Rfl: 6   Insulin  Disposable  Pump (OMNIPOD 5 G6 INTRO, GEN 5,) KIT, Change pod every 48-72 hrs, Disp: 1 kit, Rfl: 0   metoprolol  tartrate (LOPRESSOR ) 25 MG tablet, Take 1 tablet (25 mg total) by mouth 2 (two) times daily., Disp: 180 tablet, Rfl: 0   Multiple Vitamin (MULTIVITAMIN WITH MINERALS) TABS tablet, Take 1 tablet by mouth daily., Disp: , Rfl:    NON FORMULARY, Taking Total Beet product daily, Disp: , Rfl:    olmesartan  (BENICAR ) 5 MG tablet, Take 1 tablet (5 mg total) by mouth daily., Disp: 90 tablet, Rfl: 0   OTEZLA 30 MG TABS, Take 1 tablet by mouth 2 (two) times daily., Disp: , Rfl:    rosuvastatin  (CRESTOR ) 20 MG tablet, TAKE ONE TABLET BY MOUTH EVERY DAY, Disp: 90 tablet, Rfl: 0   tiZANidine  (ZANAFLEX ) 4 MG tablet, Take 1 tablet (4 mg total) by mouth every 8 (eight) hours as needed for muscle spasms., Disp: 30 tablet, Rfl: 0  "

## 2024-03-08 ENCOUNTER — Ambulatory Visit (HOSPITAL_COMMUNITY): Attending: Rheumatology

## 2024-03-08 ENCOUNTER — Encounter (HOSPITAL_COMMUNITY): Payer: Self-pay

## 2024-03-08 DIAGNOSIS — M5459 Other low back pain: Secondary | ICD-10-CM | POA: Insufficient documentation

## 2024-03-08 DIAGNOSIS — M6281 Muscle weakness (generalized): Secondary | ICD-10-CM | POA: Insufficient documentation

## 2024-03-08 NOTE — Therapy (Signed)
 " OUTPATIENT PHYSICAL THERAPY THORACOLUMBAR TREATMENT   Patient Name: Jesse Rodgers MRN: [id number] DOB:Oct 20, 1975, 49 y.o., male Today's Date: 03/08/2024  END OF SESSION:  PT End of Session - 03/08/24 1546     Visit Number 2    Number of Visits 12    Date for Recertification  04/28/24    Authorization Type BC[employer]    Authorization Time Period Auth not required    PT Start Time 1548    PT Stop Time 1626    PT Time Calculation (min) 38 min    Activity Tolerance Patient tolerated treatment well    Behavior During Therapy WFL for tasks assessed/performed          Past Medical History:  Diagnosis Date   Arthritis    back    Depression    takes Zoloft  daily   Diabetes mellitus without complication (HCC)    takes Toujeo  and Novolog  nightly;average fasting blood sugar runs 150   History of kidney stones    Hyperlipidemia    takes Simvastatin  daily   Hypertension    takes Losartan  daily   Past Surgical History:  Procedure Laterality Date   ABDOMINAL SURGERY     APPENDECTOMY     BACK SURGERY     DENTAL SURGERY  12/10/2014   molar tooth #  14,15,16  bone biopsy  of ramus   PARTIAL COLECTOMY     TOOTH EXTRACTION N/A 12/10/2014   Procedure: EXTRACTION MOLARS #14,15,16  INTRAORAL AND EXTRAORAL INCISION AND DRAINAGE AND BONE BIOPSY OF LEFT RAMUS;  Surgeon: Lonni Sax, DDS;  Location: [facility] OR;  Service: Oral Surgery;  Laterality: N/A;   Patient Active Problem List   Diagnosis Date Noted   DOE (dyspnea on exertion) 02/13/2022   OSA (obstructive sleep apnea) 02/13/2022   Chest pain 08/05/2021   Elevated troponin 08/05/2021   Diabetes mellitus type 1 (HCC) 08/05/2021   Rash 08/05/2021   Elevated LFTs 08/05/2021   Uncontrolled type 1 diabetes mellitus with hyperglycemia (HCC) 09/28/2017   Tobacco abuse 09/28/2017   Myositis 10/24/2014   Odontogenic infection of jaw 10/23/2014   Dental infection 10/23/2014   Essential hypertension, benign 10/23/2014   Mixed  hyperlipidemia 10/23/2014   Depression 10/23/2014   Dehydration 10/23/2014   DIABETES 05/15/2008   ANKLE PAIN, RIGHT 05/15/2008   Sprain of ankle 05/15/2008    PCP: Bertell Satterfield, MD   REFERRING PROVIDER: Alverda Mardy SAUNDERS, MD   REFERRING DIAG: Low back pain, unspecified   Rationale for Evaluation and Treatment: Rehabilitation  THERAPY DIAG:  Other low back pain  Muscle weakness (generalized)  ONSET DATE: 30 years  SUBJECTIVE:  SUBJECTIVE STATEMENT: 03/08/24:  Back is stiff today, pain scale 1-2/10.  Most difficulty with static standing or sitting, able to walk without pain.    Eval:  Patient reports that his back has been bothering him for about 30 years now. He has noticed that it is worse in cold weather and it has been gradually getting worse over the years. He gets really stiff if he is still for long periods of time.   PERTINENT HISTORY:  Hypertension, diabetes type 1, arthritis, and previous lumbar surgery  PAIN:  Are you having pain? Yes: NPRS scale: current: 1-2/10 Best: 1/10 Worst: 7-8/10 Pain location: mid low back  Pain description: dull, stiffness  Aggravating factors: cold weather, prolonged standing, bending over  Relieving factors: walking  PRECAUTIONS: None  RED FLAGS: None   WEIGHT BEARING RESTRICTIONS: No  FALLS:  Has patient fallen in last 6 months? 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  PATIENT SURVEYS:  Modified Oswestry:  MODIFIED OSWESTRY DISABILITY SCALE  Date: 02/14/24 Score  Pain intensity 4 =  Pain medication provides me with little relief from pain.  2. Personal care (washing, dressing, etc.) 3 =  I need help, but I am able to manage most of my personal care.  3. Lifting 1 = I can lift heavy weights, but it causes increased pain.  4. Walking 1 = Pain prevents me from walking more than 1 mile.  5. Sitting 3 =  Pain prevents me from sitting more than  hour.  6. Standing 4 =  Pain prevents me from standing more than 10 minutes.  7. Sleeping 0 = Pain does not prevent me from sleeping well.  8. Social Life 2 = Pain prevents me from participating in more energetic activities (eg. sports, dancing).  9. Traveling 1 =  I can travel anywhere, but it increases my pain.  10. Employment/ Homemaking 2 = I can perform most of my homemaking/job duties, but pain prevents me from performing more physically stressful activities (eg, lifting, vacuuming).  Total 21/50   Interpretation of scores: Score Category Description  0-20% Minimal Disability The patient can cope with most living activities. Usually no treatment is indicated apart from advice on lifting, sitting and exercise  21-40% Moderate Disability The patient experiences more pain and difficulty with sitting, lifting and standing. Travel and social life are more difficult and they may be disabled from work. Personal care, sexual activity and sleeping are not grossly affected, and the patient can usually be managed by conservative means  41-60% Severe Disability Pain remains the main problem in this group, but activities of daily living are affected. These patients require a detailed investigation  61-80% Crippled Back pain impinges on all aspects of the patients life. Positive intervention is required  81-100% Bed-bound These patients are either bed-bound or exaggerating their symptoms  Bluford FORBES Zoe DELENA Karon DELENA, et al. Surgery versus conservative management of stable thoracolumbar fracture: the PRESTO feasibility RCT. [place] ([place]): [employer]; 2021 Nov. [facility] Technology Assessment, No. 25.62.) Appendix 3, Oswestry Disability Index category descriptors. Available from: Findjewelers.cz  Minimally Clinically Important Difference (MCID) =  12.8%  COGNITION: Overall cognitive status: Within functional limits for tasks assessed     SENSATION: Patient reports no numbness or tingling in lower extremities   POSTURE: forward head  PALPATION: Increased tone noticed along bilateral lumbar paraspinals   LUMBAR ROM:   AROM eval  Flexion 50% limited; slight pain  Extension 50% limited  Right lateral flexion 25% limited   Left lateral flexion 50% limited  Right rotation 25% limited   Left rotation 25% limited   (Blank rows = not tested)  LOWER EXTREMITY ROM:  WFL for activities assessed   LOWER EXTREMITY MMT:    MMT Right eval Left eval  Hip flexion 4/5 4-/5  Hip extension    Hip abduction    Hip adduction    Hip internal rotation    Hip external rotation    Knee flexion 4/5; knee pain  4/5  Knee extension 5/5 4+/5  Ankle dorsiflexion 4/5 4/5  Ankle plantarflexion    Ankle inversion    Ankle eversion     (Blank rows = not tested)  FUNCTIONAL TESTS:  5 times sit to stand: 03/08/24: 5STS 16.26 Timed up and go (TUG): 03/08/24: TUG 7.59 2 minute walk test: 03/08/24: 476ft no AD, no pain, was limited by SOB  GAIT: Assistive device utilized: None Level of assistance: Complete Independence Comments: Decreased gait speed and stride length  TREATMENT DATE:                                                                                                                               03/08/24:  Reviewed goals Educated importance of HEP compliance  469ft no AD, no pain, was limited by SOB 5STS 16.26 TUG 7.59 3D hip excursion:    sidebend with UE overreach; rotate 5x each STS 10 eccentric control Lumbar extension pain free range Log rolling Supine:  SKTC 2x 30 LTR 5x 10 Piriformis stretch 2x 30 Bridge 10x 5    02/14/24: PT evaluation, patient education, and HEP    PATIENT EDUCATION:  Education details: Plan of care, prognosis, healing, objective findings, anatomy, HEP, and goals for physical therapy Person educated: Patient Education method: Explanation, Demonstration, and Handouts Education comprehension: verbalized understanding and returned demonstration  HOME EXERCISE PROGRAM: Access Code: [email or web address] URL: [email or web address] Date: 02/14/2024 Prepared by: Lacinda Fass  Exercises - Supine Lower Trunk Rotation  - 1-2 x daily - 7 x weekly - 3 sets - 10 reps - Supine Piriformis Stretch with Foot on Ground  - 1-2 x daily - 7 x weekly - 3 sets - 30 seconds hold  03/08/24: - Supine Bridge  - 2 x daily - 7 x weekly - 3 sets - 10 reps - 5 hold - Sit to Stand Without Arm Support  - 2 x daily - 7 x weekly - 1 sets - 10 reps  ASSESSMENT:  CLINICAL IMPRESSION: 03/08/24:  Reviewed goals, educated importance of HEP compliance for maximal benefits, pt admits  he has not began exercises at home.  Reviewed current exercise program with additional hand out given.  Functional testing complete with ability to ambulate 428ft during , 5STS in 26.16 and TUG 7.59: all in pain free range, did c/o SOB following .  Therex focus on mobility and proximal strengthening.  Pt given new printout of current HEP with additional bridge and STS for gluteal strengthening, verbalized understanding.    Eval:  Patient is a 49 y.o. male who was seen today for physical therapy evaluation and treatment for chronic low back pain.  He presented with low pain severity and irritability with lumbar joint mobility assessments to L1-5 reproducing his familiar symptoms.  He also exhibited reduced lumbar joint mobility and  active range of motion.  Recommend that he continue with skilled physical therapy to address his impairments to maximize his functional mobility.  OBJECTIVE IMPAIRMENTS: decreased activity tolerance, decreased mobility, decreased ROM, decreased strength, hypomobility, impaired flexibility, impaired tone, and pain.   ACTIVITY LIMITATIONS: lifting, bending, sitting, standing, squatting, stairs, transfers, bed mobility, bathing, and locomotion level  PARTICIPATION LIMITATIONS: shopping, community activity, occupation, and yard work  PERSONAL FACTORS: Past/current experiences, Profession, Time since onset of injury/illness/exacerbation, and 3+ comorbidities: Hypertension, diabetes type 1, arthritis, and previous lumbar surgery are also affecting patient's functional outcome.   REHAB POTENTIAL: Good  CLINICAL DECISION MAKING: Evolving/moderate complexity  EVALUATION COMPLEXITY: Moderate   GOALS: Goals reviewed with patient? Yes  SHORT TERM GOALS: Target date: 03/06/24  Patient will be independent with his initial HEP. Baseline: Goal status: INITIAL  2.  Patient will be able to demonstrate proper lifting mechanics for improved function with his critical job demands. Baseline:  Goal status: INITIAL  3.  Patient will be able to complete his daily activities without his familiar symptoms exceeding 4/10. Baseline:  Goal status: INITIAL a LONG TERM GOALS: Target date: 03/27/24  Patient will be independent with his advanced HEP. Baseline:  Goal status: INITIAL  2.  Patient will improve his ODI score to 15/50 or less for improved perceived function with his daily activities. Baseline:  Goal status: INITIAL  3.  Patient will be able to squat and lift at least 40 pounds without being limited by his familiar symptoms for improved function with his critical job demands. Baseline:  Goal status: INITIAL  4.  Patient will be able to navigate at least 4 steps with a reciprocal pattern for  improved household mobility. Baseline:  Goal status: INITIAL  PLAN:  PT FREQUENCY: 1-2x/week  PT DURATION: 6 weeks  PLANNED INTERVENTIONS: 97164- PT Re-evaluation, 97750- Physical Performance Testing, 97110-Therapeutic exercises, 97530- Therapeutic activity, [id number]- Neuromuscular re-education, 97535- Self Care, 02859- Manual therapy, [phone]- Gait training, [phone]- Electrical stimulation (unattended), [phone]- Traction (mechanical), 20560 (1-2 muscles), 20561 (3+ muscles)- Dry Needling, Patient/Family education, Balance training, Stair training, Taping, Joint mobilization, Spinal mobilization, Cryotherapy, and Moist heat.  PLAN FOR NEXT SESSION: lumbar and lower extremity strengthening, and balance interventions  Augustin Mclean, LPTA/CLT; CBIS [phone]  Mclean Augustin Amble, PTA 03/08/2024, 4:34 PM       "

## 2024-03-09 ENCOUNTER — Ambulatory Visit (HOSPITAL_COMMUNITY): Admitting: Physical Therapy

## 2024-03-09 DIAGNOSIS — M5459 Other low back pain: Secondary | ICD-10-CM | POA: Diagnosis not present

## 2024-03-09 DIAGNOSIS — M6281 Muscle weakness (generalized): Secondary | ICD-10-CM

## 2024-03-09 NOTE — Therapy (Signed)
 " OUTPATIENT PHYSICAL THERAPY THORACOLUMBAR TREATMENT   Patient Name: Jesse Rodgers MRN: [id number] DOB:Jan 01, 1976, 49 y.o., male, male Today's Date: 03/09/2024  END OF SESSION:  PT End of Session - 03/09/24 1500     Visit Number 3    Number of Visits 12    Date for Recertification  04/28/24    Authorization Type [employer]    Authorization Time Period Auth not required    PT Start Time 1500    PT Stop Time 1540    PT Time Calculation (min) 40 min    Activity Tolerance Patient tolerated treatment well    Behavior During Therapy WFL for tasks assessed/performed          Past Medical History:  Diagnosis Date   Arthritis    back    Depression    takes Zoloft  daily   Diabetes mellitus without complication (HCC)    takes Toujeo  and Novolog  nightly;average fasting blood sugar runs 150   History of kidney stones    Hyperlipidemia    takes Simvastatin  daily   Hypertension    takes Losartan  daily   Past Surgical History:  Procedure Laterality Date   ABDOMINAL SURGERY     APPENDECTOMY     BACK SURGERY     DENTAL SURGERY  12/10/2014   molar tooth #  14,15,16  bone biopsy  of ramus   PARTIAL COLECTOMY     TOOTH EXTRACTION N/A 12/10/2014   Procedure: EXTRACTION MOLARS #14,15,16  INTRAORAL AND EXTRAORAL INCISION AND DRAINAGE AND BONE BIOPSY OF LEFT RAMUS;  Surgeon: Lonni Sax, DDS;  Location: [facility] OR;  Service: Oral Surgery;  Laterality: N/A;   Patient Active Problem List   Diagnosis Date Noted   DOE (dyspnea on exertion) 02/13/2022   OSA (obstructive sleep apnea) 02/13/2022   Chest pain 08/05/2021   Elevated troponin 08/05/2021   Diabetes mellitus type 1 (HCC) 08/05/2021   Rash 08/05/2021   Elevated LFTs 08/05/2021   Uncontrolled type 1 diabetes mellitus with hyperglycemia (HCC) 09/28/2017   Tobacco abuse 09/28/2017   Myositis 10/24/2014   Odontogenic infection of jaw 10/23/2014   Dental infection 10/23/2014   Essential hypertension, benign 10/23/2014   Mixed  hyperlipidemia 10/23/2014   Depression 10/23/2014   Dehydration 10/23/2014   DIABETES 05/15/2008   ANKLE PAIN, RIGHT 05/15/2008   Sprain of ankle 05/15/2008    PCP: Bertell Satterfield, MD   REFERRING PROVIDER: Alverda Mardy SAUNDERS, MD   REFERRING DIAG: Low back pain, unspecified   Rationale for Evaluation and Treatment: Rehabilitation  THERAPY DIAG:  Other low back pain  Muscle weakness (generalized)  ONSET DATE: 30 years  SUBJECTIVE:  SUBJECTIVE STATEMENT: 03/09/24:  pt reports he is sore from yesterdays treatment.  States he's not really hurting Back is stiff with soreness into legs.    Eval:  Patient reports that his back has been bothering him for about 30 years now. He has noticed that it is worse in cold weather and it has been gradually getting worse over the years. He gets really stiff if he is still for long periods of time.   PERTINENT HISTORY:  Hypertension, diabetes type 1, arthritis, and previous lumbar surgery  PAIN:  Are you having pain? Yes: NPRS scale: current: 1-2/10 Best: 1/10 Worst: 7-8/10 Pain location: mid low back  Pain description: dull, stiffness  Aggravating factors: cold weather, prolonged standing, bending over  Relieving factors: walking  PRECAUTIONS: None  RED FLAGS: None   WEIGHT BEARING RESTRICTIONS: No  FALLS:  Has patient fallen in last 6 months? 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  PATIENT SURVEYS:  Modified Oswestry:  MODIFIED OSWESTRY DISABILITY SCALE  Date: 02/14/24 Score  Pain intensity 4 =  Pain medication provides me with little relief from pain.  2. Personal care (washing, dressing, etc.) 3 =  I need help, but I am able to manage most of my personal care.  3. Lifting 1 = I can lift heavy weights, but it causes increased pain.  4. Walking 1 = Pain prevents me from walking more than 1 mile.  5. Sitting 3 =  Pain prevents me from sitting more than  hour.  6. Standing 4 =  Pain prevents me from standing more than 10 minutes.  7. Sleeping 0 = Pain does not prevent me from sleeping well.  8. Social Life 2 = Pain prevents me from participating in more energetic activities (eg. sports, dancing).  9. Traveling 1 =  I can travel anywhere, but it increases my pain.  10. Employment/ Homemaking 2 = I can perform most of my homemaking/job duties, but pain prevents me from performing more physically stressful activities (eg, lifting, vacuuming).  Total 21/50   Interpretation of scores: Score Category Description  0-20% Minimal Disability The patient can cope with most living activities. Usually no treatment is indicated apart from advice on lifting, sitting and exercise  21-40% Moderate Disability The patient experiences more pain and difficulty with sitting, lifting and standing. Travel and social life are more difficult and they may be disabled from work. Personal care, sexual activity and sleeping are not grossly affected, and the patient can usually be managed by conservative means  41-60% Severe Disability Pain remains the main problem in this group, but activities of daily living are affected. These patients require a detailed investigation  61-80% Crippled Back pain impinges on all aspects of the patients life. Positive intervention is required  81-100% Bed-bound These patients are either bed-bound or exaggerating their symptoms  Bluford FORBES Zoe DELENA Karon DELENA, et al. Surgery versus conservative management of stable thoracolumbar fracture: the PRESTO feasibility RCT. [place] ([place]): [employer]; 2021 Nov. [facility] Technology Assessment, No. 25.62.) Appendix 3, Oswestry Disability Index category descriptors. Available from: Findjewelers.cz  Minimally Clinically Important Difference (MCID) =  12.8%  COGNITION: Overall cognitive status: Within functional limits for tasks assessed     SENSATION: Patient reports no numbness or tingling in lower extremities   POSTURE: forward head  PALPATION: Increased tone noticed along bilateral lumbar paraspinals   LUMBAR ROM:   AROM eval  Flexion 50% limited; slight pain  Extension 50% limited  Right lateral flexion 25% limited   Left lateral flexion 50% limited  Right rotation 25% limited   Left rotation 25% limited   (Blank rows = not tested)  LOWER EXTREMITY ROM:  WFL for activities assessed   LOWER EXTREMITY MMT:    MMT Right eval Left eval  Hip flexion 4/5 4-/5  Hip extension    Hip abduction    Hip adduction    Hip internal rotation    Hip external rotation    Knee flexion 4/5; knee pain  4/5  Knee extension 5/5 4+/5  Ankle dorsiflexion 4/5 4/5  Ankle plantarflexion    Ankle inversion    Ankle eversion     (Blank rows = not tested)  FUNCTIONAL TESTS:  5 times sit to stand: 03/08/24: 5STS 16.26 Timed up and go (TUG): 03/08/24: TUG 7.59 2 minute walk test: 03/08/24: 414ft no AD, no pain, was limited by SOB  GAIT: Assistive device utilized: None Level of assistance: Complete Independence Comments: Decreased gait speed and stride length  TREATMENT DATE:                                                                                                                               03/09/2024: Nustep seat 10, UE/LE level 4, 5 minutes Standing lumbar extension against counter 5X Supine:  single KTC 5X10 with towel  LTR 10X  each  Piriformis stretch 2X30  Bridge 10X  SLR 5X each Long sitting hamstring stretch 30 each side   03/08/24:  Reviewed goals Educated importance of HEP compliance  45ft no AD, no pain, was limited by SOB 5STS 16.26 TUG 7.59 3D hip excursion:   sidebend with UE overreach; rotate 5x each STS 10 eccentric control Lumbar extension pain free range Log rolling Supine:  SKTC 2x 30 LTR 5x 10 Piriformis stretch 2x 30 Bridge 10x 5    02/14/24: PT evaluation, patient education, and HEP    PATIENT EDUCATION:  Education details: Plan of care, prognosis, healing, objective findings, anatomy, HEP, and goals for physical therapy Person educated: Patient Education method: Explanation, Demonstration, and Handouts Education comprehension: verbalized understanding and returned demonstration  HOME EXERCISE PROGRAM: Access Code: [email or web address] URL: [email or web address] Date: 02/14/2024 Prepared by: Lacinda Fass  Exercises - Supine Lower Trunk Rotation  - 1-2 x daily - 7 x weekly - 3 sets - 10 reps - Supine Piriformis Stretch with Foot on Ground  - 1-2 x daily - 7 x weekly - 3 sets - 30 seconds hold  03/08/24: - Supine Bridge  - 2 x daily - 7 x weekly - 3 sets -  10 reps - 5 hold - Sit to Stand Without Arm Support  - 2 x daily - 7 x weekly - 1 sets - 10 reps  ASSESSMENT:  CLINICAL IMPRESSION: 03/09/24:  pt with some general soreness day from beginning new exercises.  Began on nustep for warm up and continued with established exercises.  Did add SLR to improve LE strength as well as seated hamstring stretch as with noted tightness.  Rt hamstring much tighter than Lt. Encouraged pt to try these over the weekend, however did not add these to HEP.  Encouraged standing extension also as with general tightness but less symptoms as with flexion.  Pt will continue to benefit from skilled therapy to progress towards goals and reduce deficits.     Eval:  Patient is a 49 y.o.  male who was seen today for physical therapy evaluation and treatment for chronic low back pain.  He presented with low pain severity and irritability with lumbar joint mobility assessments to L1-5 reproducing his familiar symptoms.  He also exhibited reduced lumbar joint mobility and active range of motion.  Recommend that he continue with skilled physical therapy to address his impairments to maximize his functional mobility.  OBJECTIVE IMPAIRMENTS: decreased activity tolerance, decreased mobility, decreased ROM, decreased strength, hypomobility, impaired flexibility, impaired tone, and pain.   ACTIVITY LIMITATIONS: lifting, bending, sitting, standing, squatting, stairs, transfers, bed mobility, bathing, and locomotion level  PARTICIPATION LIMITATIONS: shopping, community activity, occupation, and yard work  PERSONAL FACTORS: Past/current experiences, Profession, Time since onset of injury/illness/exacerbation, and 3+ comorbidities: Hypertension, diabetes type 1, arthritis, and previous lumbar surgery are also affecting patient's functional outcome.   REHAB POTENTIAL: Good  CLINICAL DECISION MAKING: Evolving/moderate complexity  EVALUATION COMPLEXITY: Moderate   GOALS: Goals reviewed with patient? Yes  SHORT TERM GOALS: Target date: 03/06/24  Patient will be independent with his initial HEP. Baseline: Goal status: INITIAL  2.  Patient will be able to demonstrate proper lifting mechanics for improved function with his critical job demands. Baseline:  Goal status: INITIAL  3.  Patient will be able to complete his daily activities without his familiar symptoms exceeding 4/10. Baseline:  Goal status: INITIAL a LONG TERM GOALS: Target date: 03/27/24  Patient will be independent with his advanced HEP. Baseline:  Goal status: INITIAL  2.  Patient will improve his ODI score to 15/50 or less for improved perceived function with his daily activities. Baseline:  Goal status:  INITIAL  3.  Patient will be able to squat and lift at least 40 pounds without being limited by his familiar symptoms for improved function with his critical job demands. Baseline:  Goal status: INITIAL  4.  Patient will be able to navigate at least 4 steps with a reciprocal pattern for improved household mobility. Baseline:  Goal status: INITIAL  PLAN:  PT FREQUENCY: 1-2x/week  PT DURATION: 6 weeks  PLANNED INTERVENTIONS: 97164- PT Re-evaluation, 97750- Physical Performance Testing, 97110-Therapeutic exercises, 97530- Therapeutic activity, [id number]- Neuromuscular re-education, 97535- Self Care, 02859- Manual therapy, [phone]- Gait training, [phone]- Electrical stimulation (unattended), [phone]- Traction (mechanical), 20560 (1-2 muscles), 20561 (3+ muscles)- Dry Needling, Patient/Family education, Balance training, Stair training, Taping, Joint mobilization, Spinal mobilization, Cryotherapy, and Moist heat.  PLAN FOR NEXT SESSION: lumbar and lower extremity strengthening, and balance interventions.  Next session progress standing LE exercises and stabilization activities.   Greig KATHEE Fuse, PTA/CLT [facility] Ph: [phone]  Fuse Greig KATHEE, PTA 03/09/2024, 3:01 PM       "

## 2024-03-13 ENCOUNTER — Encounter (HOSPITAL_COMMUNITY): Payer: Self-pay

## 2024-03-13 ENCOUNTER — Ambulatory Visit (HOSPITAL_COMMUNITY)

## 2024-03-13 DIAGNOSIS — M6281 Muscle weakness (generalized): Secondary | ICD-10-CM

## 2024-03-13 DIAGNOSIS — M5459 Other low back pain: Secondary | ICD-10-CM

## 2024-03-13 NOTE — Therapy (Signed)
 " OUTPATIENT PHYSICAL THERAPY THORACOLUMBAR TREATMENT   Patient Name: Jesse Rodgers MRN: [id number] DOB:07-May-1975, 49 y.o., male Today's Date: 03/13/2024  END OF SESSION:  PT End of Session - 03/13/24 1502     Visit Number 4    Number of Visits 12    Date for Recertification  04/28/24    Authorization Type [employer]    Authorization Time Period Auth not required    PT Start Time 1503    PT Stop Time 1542    PT Time Calculation (min) 39 min    Activity Tolerance Patient tolerated treatment well    Behavior During Therapy WFL for tasks assessed/performed           Past Medical History:  Diagnosis Date   Arthritis    back    Depression    takes Zoloft  daily   Diabetes mellitus without complication (HCC)    takes Toujeo  and Novolog  nightly;average fasting blood sugar runs 150   History of kidney stones    Hyperlipidemia    takes Simvastatin  daily   Hypertension    takes Losartan  daily   Past Surgical History:  Procedure Laterality Date   ABDOMINAL SURGERY     APPENDECTOMY     BACK SURGERY     DENTAL SURGERY  12/10/2014   molar tooth #  14,15,16  bone biopsy  of ramus   PARTIAL COLECTOMY     TOOTH EXTRACTION N/A 12/10/2014   Procedure: EXTRACTION MOLARS #14,15,16  INTRAORAL AND EXTRAORAL INCISION AND DRAINAGE AND BONE BIOPSY OF LEFT RAMUS;  Surgeon: Lonni Sax, DDS;  Location: [facility] OR;  Service: Oral Surgery;  Laterality: N/A;   Patient Active Problem List   Diagnosis Date Noted   DOE (dyspnea on exertion) 02/13/2022   OSA (obstructive sleep apnea) 02/13/2022   Chest pain 08/05/2021   Elevated troponin 08/05/2021   Diabetes mellitus type 1 (HCC) 08/05/2021   Rash 08/05/2021   Elevated LFTs 08/05/2021   Uncontrolled type 1 diabetes mellitus with hyperglycemia (HCC) 09/28/2017   Tobacco abuse 09/28/2017   Myositis 10/24/2014   Odontogenic infection of jaw 10/23/2014   Dental infection 10/23/2014   Essential hypertension, benign 10/23/2014   Mixed  hyperlipidemia 10/23/2014   Depression 10/23/2014   Dehydration 10/23/2014   DIABETES 05/15/2008   ANKLE PAIN, RIGHT 05/15/2008   Sprain of ankle 05/15/2008    PCP: Bertell Satterfield, MD   REFERRING PROVIDER: Alverda Mardy SAUNDERS, MD   REFERRING DIAG: Low back pain, unspecified   Rationale for Evaluation and Treatment: Rehabilitation  THERAPY DIAG:  Other low back pain  Muscle weakness (generalized)  ONSET DATE: 30 years  SUBJECTIVE:  SUBJECTIVE STATEMENT: Pt states back is just stiff, not much pain this date. Pt states he has not been doing the HEP. Pt states the morning is the stiffest part of the day.   Eval:  Patient reports that his back has been bothering him for about 30 years now. He has noticed that it is worse in cold weather and it has been gradually getting worse over the years. He gets really stiff if he is still for long periods of time.   PERTINENT HISTORY:  Hypertension, diabetes type 1, arthritis, and previous lumbar surgery  PAIN:  Are you having pain? Yes: NPRS scale: current: 1-2/10 Best: 1/10 Worst: 7-8/10 Pain location: mid low back  Pain description: dull, stiffness  Aggravating factors: cold weather, prolonged standing, bending over  Relieving factors: walking  PRECAUTIONS: None  RED FLAGS: None   WEIGHT BEARING RESTRICTIONS: No  FALLS:  Has patient fallen in last 6 months? 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  PATIENT SURVEYS:  Modified Oswestry:  MODIFIED OSWESTRY DISABILITY SCALE  Date: 02/14/24 Score  Pain intensity 4 =  Pain medication provides me with little relief from pain.  2. Personal care (washing, dressing, etc.) 3 =  I need help, but I am able to manage most of my personal care.  3. Lifting 1 = I can lift heavy weights, but it causes increased pain.  4. Walking 1 = Pain prevents me from walking more than 1 mile.  5. Sitting 3 =  Pain prevents me from sitting more than  hour.  6. Standing 4 =  Pain prevents me from standing more than 10 minutes.  7. Sleeping 0 = Pain does not prevent me from sleeping well.  8. Social Life 2 = Pain prevents me from participating in more energetic activities (eg. sports, dancing).  9. Traveling 1 =  I can travel anywhere, but it increases my pain.  10. Employment/ Homemaking 2 = I can perform most of my homemaking/job duties, but pain prevents me from performing more physically stressful activities (eg, lifting, vacuuming).  Total 21/50   Interpretation of scores: Score Category Description  0-20% Minimal Disability The patient can cope with most living activities. Usually no treatment is indicated apart from advice on lifting, sitting and exercise  21-40% Moderate Disability The patient experiences more pain and difficulty with sitting, lifting and standing. Travel and social life are more difficult and they may be disabled from work. Personal care, sexual activity and sleeping are not grossly affected, and the patient can usually be managed by conservative means  41-60% Severe Disability Pain remains the main problem in this group, but activities of daily living are affected. These patients require a detailed investigation  61-80% Crippled Back pain impinges on all aspects of the patients life. Positive intervention is required  81-100% Bed-bound These patients are either bed-bound or exaggerating their symptoms   Bluford FORBES Zoe DELENA Karon DELENA, et al. Surgery versus conservative management of stable thoracolumbar fracture: the PRESTO feasibility RCT. [place] ([place]): [employer]; 2021 Nov. [facility] Technology Assessment, No. 25.62.) Appendix 3, Oswestry Disability Index category descriptors. Available from:  Findjewelers.cz  Minimally Clinically Important Difference (MCID) = 12.8%  COGNITION: Overall cognitive status: Within functional limits for tasks assessed     SENSATION: Patient reports no numbness or tingling in lower extremities   POSTURE: forward head  PALPATION: Increased tone noticed along bilateral lumbar paraspinals   LUMBAR ROM:   AROM eval  Flexion 50% limited; slight pain  Extension 50% limited  Right lateral flexion 25% limited   Left lateral flexion 50% limited  Right rotation 25% limited   Left rotation 25% limited   (Blank rows = not tested)  LOWER EXTREMITY ROM:  WFL for activities assessed   LOWER EXTREMITY MMT:    MMT Right eval Left eval  Hip flexion 4/5 4-/5  Hip extension    Hip abduction    Hip adduction    Hip internal rotation    Hip external rotation    Knee flexion 4/5; knee pain  4/5  Knee extension 5/5 4+/5  Ankle dorsiflexion 4/5 4/5  Ankle plantarflexion    Ankle inversion    Ankle eversion     (Blank rows = not tested)  FUNCTIONAL TESTS:  5 times sit to stand: 03/08/24: 5STS 16.26 Timed up and go (TUG): 03/08/24: TUG 7.59 2 minute walk test: 03/08/24: 457ft no AD, no pain, was limited by SOB  GAIT: Assistive device utilized: None Level of assistance: Complete Independence Comments: Decreased gait speed and stride length  TREATMENT DATE:                                                                                                                               03/13/2024  Therapeutic Exercise: -Treadmill, 5 minutes, level 2 grade, Speed 2.5>2.1, pt cued for pain free speed, SOB noted -Supine  bridges on green exercise ball, 1 sets of 10 reps, 3 second holds, pt cued for max hip extension -Double knees to chest, 2 sets of 10 reps, on green theraball, pt cued for max pain free ROM and smooth motion Neuromuscular Re-education: -Lower trunk rotations on green exercise ball, 2 set of 10 reps, bilaterally, pt cued to remain in pain free ROM -Paloff press walk outs, plate 2 on bodycraft, 3 laps bilaterally, pt cued for sequencing and upright posture -Side plank, 1 set of 3 reps of 10 second holds, bilaterally, pt cued for increased hip elevation and proper LE/UE placement -Modified crunch, 2 set of 10 reps, 3 second holds, pt cued for sequencing and UE/LE placement -Bird dog, 1 set of 7 reps, bilaterally, pt cued for increased hip ROM and neutral spine throughout movement, pt cued for core activation Therapeutic Activity: -Aura carry with 10lb dumbbell, 1 minute each side, pt cued for core activation and level shoulders  03/09/2024: Nustep seat 10, UE/LE level 4, 5 minutes Standing lumbar extension against counter 5X Supine:  single KTC 5X10 with towel  LTR 10X each  Piriformis stretch 2X30  Bridge 10X  SLR 5X each Long sitting hamstring stretch  30 each side   03/08/24:  Reviewed goals Educated importance of HEP compliance  451ft no AD, no pain, was limited by SOB 5STS 16.26 TUG 7.59 3D hip excursion:   sidebend with UE overreach; rotate 5x each STS 10 eccentric control Lumbar extension pain free range Log rolling Supine:  SKTC 2x 30 LTR 5x 10 Piriformis stretch 2x 30 Bridge 10x 5      PATIENT EDUCATION:  Education details: Plan of care, prognosis, healing, objective findings, anatomy, HEP, and goals for physical therapy Person educated: Patient Education method: Explanation, Demonstration, and Handouts Education comprehension: verbalized understanding and returned demonstration  HOME EXERCISE PROGRAM: Access Code: [email or web address] URL:  [email or web address] Date: 02/14/2024 Prepared by: Lacinda Fass  Exercises - Supine Lower Trunk Rotation  - 1-2 x daily - 7 x weekly - 3 sets - 10 reps - Supine Piriformis Stretch with Foot on Ground  - 1-2 x daily - 7 x weekly - 3 sets - 30 seconds hold  03/08/24: - Supine Bridge  - 2 x daily - 7 x weekly - 3 sets - 10 reps - 5 hold - Sit to Stand Without Arm Support  - 2 x daily - 7 x weekly - 1 sets - 10 reps  ASSESSMENT:  CLINICAL IMPRESSION: Patient continues to demonstrate low back stiffness, decreased core strength, increased gait quality and impaired balance. Patient also demonstrates decreased endurance with aerobic based exercise during today's session SOB noted on treadmill. Patient able to progress dynamic balance and core activation exercises today with side planks and bird dogs, good performance with verbal cueing. Patient would continue to benefit from skilled physical therapy for decreased low back pain, increased endurance with ambulation, increased LE/core strength, and improved balance for improved quality of life, improved independence with management of low back and continued progress towards therapy goals.      Eval:  Patient is a 49 y.o. male who was seen today for physical therapy evaluation and treatment for chronic low back pain.  He presented with low pain severity and irritability with lumbar joint mobility assessments to L1-5 reproducing his familiar symptoms.  He also exhibited reduced lumbar joint mobility and active range of motion.  Recommend that he continue with skilled physical therapy to address his impairments to maximize his functional mobility.  OBJECTIVE IMPAIRMENTS: decreased activity tolerance, decreased mobility, decreased ROM, decreased strength, hypomobility, impaired flexibility, impaired tone, and pain.   ACTIVITY LIMITATIONS: lifting, bending, sitting, standing, squatting, stairs, transfers, bed mobility, bathing, and locomotion  level  PARTICIPATION LIMITATIONS: shopping, community activity, occupation, and yard work  PERSONAL FACTORS: Past/current experiences, Profession, Time since onset of injury/illness/exacerbation, and 3+ comorbidities: Hypertension, diabetes type 1, arthritis, and previous lumbar surgery are also affecting patient's functional outcome.   REHAB POTENTIAL: Good  CLINICAL DECISION MAKING: Evolving/moderate complexity  EVALUATION COMPLEXITY: Moderate   GOALS: Goals reviewed with patient? Yes  SHORT TERM GOALS: Target date: 03/06/24  Patient will be independent with his initial HEP. Baseline: Goal status: INITIAL  2.  Patient will be able to demonstrate proper lifting mechanics for improved function with his critical job demands. Baseline:  Goal status: INITIAL  3.  Patient will be able to complete his daily activities without his familiar symptoms exceeding 4/10. Baseline:  Goal status: INITIAL a LONG TERM GOALS: Target date: 03/27/24  Patient will be independent with his advanced HEP. Baseline:  Goal status: INITIAL  2.  Patient will improve his ODI score to 15/50 or less for improved perceived function with  his daily activities. Baseline:  Goal status: INITIAL  3.  Patient will be able to squat and lift at least 40 pounds without being limited by his familiar symptoms for improved function with his critical job demands. Baseline:  Goal status: INITIAL  4.  Patient will be able to navigate at least 4 steps with a reciprocal pattern for improved household mobility. Baseline:  Goal status: INITIAL  PLAN:  PT FREQUENCY: 1-2x/week  PT DURATION: 6 weeks  PLANNED INTERVENTIONS: 97164- PT Re-evaluation, 97750- Physical Performance Testing, 97110-Therapeutic exercises, 97530- Therapeutic activity, [id number]- Neuromuscular re-education, 97535- Self Care, 02859- Manual therapy, [phone]- Gait training, [phone]- Electrical stimulation (unattended), [phone]- Traction (mechanical), 20560 (1-2  muscles), 20561 (3+ muscles)- Dry Needling, Patient/Family education, Balance training, Stair training, Taping, Joint mobilization, Spinal mobilization, Cryotherapy, and Moist heat.  PLAN FOR NEXT SESSION: lumbar and lower extremity strengthening, and balance interventions.  Next session progress standing LE exercises and stabilization activities.   Lang Ada, PT, DPT [facility] Office: [phone] 3:44 PM, 03/13/2024       "

## 2024-03-15 ENCOUNTER — Ambulatory Visit (HOSPITAL_COMMUNITY)

## 2024-03-17 ENCOUNTER — Ambulatory Visit (HOSPITAL_COMMUNITY)

## 2024-03-21 ENCOUNTER — Ambulatory Visit (HOSPITAL_COMMUNITY)

## 2024-03-23 ENCOUNTER — Encounter (HOSPITAL_COMMUNITY): Payer: Self-pay

## 2024-03-23 ENCOUNTER — Ambulatory Visit (HOSPITAL_COMMUNITY)

## 2024-03-23 DIAGNOSIS — M5459 Other low back pain: Secondary | ICD-10-CM

## 2024-03-23 DIAGNOSIS — M6281 Muscle weakness (generalized): Secondary | ICD-10-CM

## 2024-03-23 NOTE — Therapy (Signed)
 " OUTPATIENT PHYSICAL THERAPY THORACOLUMBAR TREATMENT   Patient Name: Jesse Rodgers MRN: [id number] DOB:01/29/1976, 49 y.o., male Today's Date: 03/23/2024  END OF SESSION:  PT End of Session - 03/23/24 1633     Visit Number 5    Number of Visits 12    Date for Recertification  04/28/24    Authorization Type [employer]    Authorization Time Period Auth not required    PT Start Time 1634    PT Stop Time 1714    PT Time Calculation (min) 40 min    Activity Tolerance Patient tolerated treatment well    Behavior During Therapy WFL for tasks assessed/performed            Past Medical History:  Diagnosis Date   Arthritis    back    Depression    takes Zoloft  daily   Diabetes mellitus without complication (HCC)    takes Toujeo  and Novolog  nightly;average fasting blood sugar runs 150   History of kidney stones    Hyperlipidemia    takes Simvastatin  daily   Hypertension    takes Losartan  daily   Past Surgical History:  Procedure Laterality Date   ABDOMINAL SURGERY     APPENDECTOMY     BACK SURGERY     DENTAL SURGERY  12/10/2014   molar tooth #  14,15,16  bone biopsy  of ramus   PARTIAL COLECTOMY     TOOTH EXTRACTION N/A 12/10/2014   Procedure: EXTRACTION MOLARS #14,15,16  INTRAORAL AND EXTRAORAL INCISION AND DRAINAGE AND BONE BIOPSY OF LEFT RAMUS;  Surgeon: Lonni Sax, DDS;  Location: [facility] OR;  Service: Oral Surgery;  Laterality: N/A;   Patient Active Problem List   Diagnosis Date Noted   DOE (dyspnea on exertion) 02/13/2022   OSA (obstructive sleep apnea) 02/13/2022   Chest pain 08/05/2021   Elevated troponin 08/05/2021   Diabetes mellitus type 1 (HCC) 08/05/2021   Rash 08/05/2021   Elevated LFTs 08/05/2021   Uncontrolled type 1 diabetes mellitus with hyperglycemia (HCC) 09/28/2017   Tobacco abuse 09/28/2017   Myositis 10/24/2014   Odontogenic infection of jaw 10/23/2014   Dental infection 10/23/2014   Essential hypertension, benign 10/23/2014   Mixed  hyperlipidemia 10/23/2014   Depression 10/23/2014   Dehydration 10/23/2014   DIABETES 05/15/2008   ANKLE PAIN, RIGHT 05/15/2008   Sprain of ankle 05/15/2008    PCP: Bertell Satterfield, MD   REFERRING PROVIDER: Alverda Mardy SAUNDERS, MD   REFERRING DIAG: Low back pain, unspecified   Rationale for Evaluation and Treatment: Rehabilitation  THERAPY DIAG:  Other low back pain  Muscle weakness (generalized)  ONSET DATE: 30 years  SUBJECTIVE:  SUBJECTIVE STATEMENT: Patient reports that his pain is about the same today. He did not hurt more after his last appointment, but he noted that it was more work.   Eval:  Patient reports that his back has been bothering him for about 30 years now. He has noticed that it is worse in cold weather and it has been gradually getting worse over the years. He gets really stiff if he is still for long periods of time.   PERTINENT HISTORY:  Hypertension, diabetes type 1, arthritis, and previous lumbar surgery  PAIN:  Are you having pain? Yes: NPRS scale: current: 2/10 Best: 1/10 Worst: 7-8/10 Pain location: mid low back  Pain description: dull, stiffness  Aggravating factors: cold weather, prolonged standing, bending over  Relieving factors: walking  PRECAUTIONS: None  RED FLAGS: None   WEIGHT BEARING RESTRICTIONS: No  FALLS:  Has patient fallen in last 6 months? 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  PATIENT SURVEYS:  Modified Oswestry:  MODIFIED OSWESTRY DISABILITY SCALE  Date: 02/14/24 Score  Pain intensity 4 =  Pain medication provides me with little relief from pain.  2. Personal care (washing, dressing, etc.) 3 =  I need help, but I am able to manage most of my personal care.  3. Lifting 1 = I can lift heavy weights, but it causes increased pain.  4. Walking 1 = Pain prevents me from walking more than 1 mile.  5. Sitting 3 =  Pain prevents me from sitting more than  hour.  6. Standing 4 =  Pain prevents me from standing more than 10 minutes.  7. Sleeping 0 = Pain does not prevent me from sleeping well.  8. Social Life 2 = Pain prevents me from participating in more energetic activities (eg. sports, dancing).  9. Traveling 1 =  I can travel anywhere, but it increases my pain.  10. Employment/ Homemaking 2 = I can perform most of my homemaking/job duties, but pain prevents me from performing more physically stressful activities (eg, lifting, vacuuming).  Total 21/50   Interpretation of scores: Score Category Description  0-20% Minimal Disability The patient can cope with most living activities. Usually no treatment is indicated apart from advice on lifting, sitting and exercise  21-40% Moderate Disability The patient experiences more pain and difficulty with sitting, lifting and standing. Travel and social life are more difficult and they may be disabled from work. Personal care, sexual activity and sleeping are not grossly affected, and the patient can usually be managed by conservative means  41-60% Severe Disability Pain remains the main problem in this group, but activities of daily living are affected. These patients require a detailed investigation  61-80% Crippled Back pain impinges on all aspects of the patients life. Positive intervention is required  81-100% Bed-bound These patients are either bed-bound or exaggerating their symptoms  Bluford FORBES Zoe DELENA Karon DELENA, et al. Surgery versus conservative management of stable thoracolumbar fracture: the PRESTO feasibility RCT. [place] ([place]): [employer]; 2021 Nov. [facility] Technology Assessment, No. 25.62.) Appendix 3, Oswestry Disability Index category descriptors. Available from: [email or web address].cz  Minimally Clinically  Important Difference (MCID) = 12.8%  COGNITION: Overall cognitive status: Within functional limits for tasks assessed     SENSATION: Patient reports no numbness or tingling in lower extremities   POSTURE: forward head  PALPATION: Increased tone noticed along bilateral lumbar paraspinals   LUMBAR ROM:   AROM eval  Flexion 50% limited; slight pain  Extension 50% limited  Right lateral flexion 25% limited   Left lateral flexion 50% limited  Right rotation 25% limited   Left rotation 25% limited   (Blank rows = not tested)  LOWER EXTREMITY ROM:  WFL for activities assessed   LOWER EXTREMITY MMT:    MMT Right eval Left eval  Hip flexion 4/5 4-/5  Hip extension    Hip abduction    Hip adduction    Hip internal rotation    Hip external rotation    Knee flexion 4/5; knee pain  4/5  Knee extension 5/5 4+/5  Ankle dorsiflexion 4/5 4/5  Ankle plantarflexion    Ankle inversion    Ankle eversion     (Blank rows = not tested)  FUNCTIONAL TESTS:  5 times sit to stand: 03/08/24: 5STS 16.26 Timed up and go (TUG): 03/08/24: TUG 7.59 2 minute walk test: 03/08/24: 478ft no AD, no pain, was limited by SOB  GAIT: Assistive device utilized: None Level of assistance: Complete Independence Comments: Decreased gait speed and stride length  TREATMENT DATE:                                                                                                                                                                 03/23/24 EXERCISE LOG  Exercise Repetitions and Resistance Comments  Treadmill  2.0 mph @ 2% grade x 5 minutes     Pallof press with shoulder flexion   GTB x 20 reps each    Double knee to chest   2 minutes  LE supported on green ball   Lower trunk rotation 2 minutes  LE supported on green ball   Standing open books  20 reps each    Standing ball roll out  2 minutes    Resisted pull down  GTB x 25 reps    Tandem stance  3 x 30 seconds    Sit to stand  15 reps  Holding tidal tank to chest    Blank cell = exercise not performed today   03/13/2024  Therapeutic Exercise: -Treadmill, 5 minutes, level 2 grade, Speed 2.5>2.1, pt cued for pain free speed, SOB noted -Supine bridges on green exercise ball, 1 sets of 10 reps, 3 second holds, pt cued for max hip extension -Double knees to chest, 2 sets of 10 reps, on green theraball, pt cued for max pain free ROM and smooth motion Neuromuscular Re-education: -Lower  trunk rotations on green exercise ball, 2 set of 10 reps, bilaterally, pt cued to remain in pain free ROM -Paloff press walk outs, plate 2 on bodycraft, 3 laps bilaterally, pt cued for sequencing and upright posture -Side plank, 1 set of 3 reps of 10 second holds, bilaterally, pt cued for increased hip elevation and proper LE/UE placement -Modified crunch, 2 set of 10 reps, 3 second holds, pt cued for sequencing and UE/LE placement -Bird dog, 1 set of 7 reps, bilaterally, pt cued for increased hip ROM and neutral spine throughout movement, pt cued for core activation Therapeutic Activity: -Aura carry with 10lb dumbbell, 1 minute each side, pt cued for core activation and level shoulders  03/09/2024: Nustep seat 10, UE/LE level 4, 5 minutes Standing lumbar extension against counter 5X Supine:  single KTC 5X10 with towel  LTR 10X each  Piriformis stretch 2X30  Bridge 10X  SLR 5X each Long sitting hamstring stretch 30 each side   PATIENT EDUCATION:  Education details: Plan of care, prognosis, healing, objective findings, anatomy, HEP, and goals for physical therapy Person educated:  Patient Education method: Explanation, Demonstration, and Handouts Education comprehension: verbalized understanding and returned demonstration  HOME EXERCISE PROGRAM: Access Code: [email or web address] URL: [email or web address] Date: 02/14/2024 Prepared by: Lacinda Fass  Exercises - Supine Lower Trunk Rotation  - 1-2 x daily - 7 x weekly - 3 sets - 10 reps - Supine Piriformis Stretch with Foot on Ground  - 1-2 x daily - 7 x weekly - 3 sets - 30 seconds hold  03/08/24: - Supine Bridge  - 2 x daily - 7 x weekly - 3 sets - 10 reps - 5 hold - Sit to Stand Without Arm Support  - 2 x daily - 7 x weekly - 1 sets - 10 reps  ASSESSMENT:  CLINICAL IMPRESSION: Patient was progressed with new and familiar interventions for lumbar and lower extremity stability. He required minimal cueing with today's new interventions for proper biomechanics. He experienced no increase in pain or discomfort with any of today's interventions. He reported feeling good upon the conclusion of treatment. Patient continues to require skilled physical therapy to address their remaining impairments to return to her prior level of function.    Eval:  Patient is a 49 y.o. male who was seen today for physical therapy evaluation and treatment for chronic low back pain.  He presented with low pain severity and irritability with lumbar joint mobility assessments to L1-5 reproducing his familiar symptoms.  He also exhibited reduced lumbar joint mobility and active range of motion.  Recommend that he continue with skilled physical therapy to address his impairments to maximize his functional mobility.  OBJECTIVE IMPAIRMENTS: decreased activity tolerance, decreased mobility, decreased ROM, decreased strength, hypomobility, impaired flexibility, impaired tone, and pain.   ACTIVITY LIMITATIONS: lifting, bending, sitting, standing, squatting, stairs, transfers, bed mobility, bathing, and locomotion level  PARTICIPATION LIMITATIONS:  shopping, community activity, occupation, and yard work  PERSONAL FACTORS: Past/current experiences, Profession, Time since onset of injury/illness/exacerbation, and 3+ comorbidities: Hypertension, diabetes type 1, arthritis, and previous lumbar surgery are also affecting patient's functional outcome.   REHAB POTENTIAL: Good  CLINICAL DECISION MAKING: Evolving/moderate complexity  EVALUATION COMPLEXITY: Moderate   GOALS: Goals reviewed with patient? Yes  SHORT TERM GOALS: Target date: 03/06/24  Patient will be independent with his initial HEP. Baseline: Goal status: INITIAL  2.  Patient will be able to demonstrate proper lifting mechanics for improved function with his critical job demands. Baseline:  Goal  status: INITIAL  3.  Patient will be able to complete his daily activities without his familiar symptoms exceeding 4/10. Baseline:  Goal status: INITIAL a LONG TERM GOALS: Target date: 03/27/24  Patient will be independent with his advanced HEP. Baseline:  Goal status: INITIAL  2.  Patient will improve his ODI score to 15/50 or less for improved perceived function with his daily activities. Baseline:  Goal status: INITIAL  3.  Patient will be able to squat and lift at least 40 pounds without being limited by his familiar symptoms for improved function with his critical job demands. Baseline:  Goal status: INITIAL  4.  Patient will be able to navigate at least 4 steps with a reciprocal pattern for improved household mobility. Baseline:  Goal status: INITIAL  PLAN:  PT FREQUENCY: 1-2x/week  PT DURATION: 6 weeks  PLANNED INTERVENTIONS: 97164- PT Re-evaluation, 97750- Physical Performance Testing, 97110-Therapeutic exercises, 97530- Therapeutic activity, [id number]- Neuromuscular re-education, 97535- Self Care, [id number]- Manual therapy, [phone]- Gait training, [phone]- Electrical stimulation (unattended), [phone]- Traction (mechanical), 20560 (1-2 muscles), 20561 (3+ muscles)- Dry  Needling, Patient/Family education, Balance training, Stair training, Taping, Joint mobilization, Spinal mobilization, Cryotherapy, and Moist heat.  PLAN FOR NEXT SESSION: lumbar and lower extremity strengthening, and balance interventions.  Next session progress standing LE exercises and stabilization activities.   Lacinda Fass, PT, DPT  [facility] Office: [phone] 6:50 PM, 03/23/24       "

## 2024-03-28 ENCOUNTER — Ambulatory Visit (HOSPITAL_COMMUNITY): Admitting: Physical Therapy

## 2024-03-31 ENCOUNTER — Encounter (HOSPITAL_COMMUNITY): Payer: Self-pay

## 2024-03-31 ENCOUNTER — Ambulatory Visit (HOSPITAL_COMMUNITY)

## 2024-03-31 DIAGNOSIS — M5459 Other low back pain: Secondary | ICD-10-CM

## 2024-03-31 DIAGNOSIS — M6281 Muscle weakness (generalized): Secondary | ICD-10-CM

## 2024-04-04 ENCOUNTER — Encounter (HOSPITAL_COMMUNITY): Payer: Self-pay

## 2024-04-04 ENCOUNTER — Ambulatory Visit (HOSPITAL_COMMUNITY)

## 2024-04-04 DIAGNOSIS — M5459 Other low back pain: Secondary | ICD-10-CM

## 2024-04-04 DIAGNOSIS — M6281 Muscle weakness (generalized): Secondary | ICD-10-CM

## 2024-04-06 ENCOUNTER — Telehealth (HOSPITAL_COMMUNITY): Payer: Self-pay

## 2024-04-06 ENCOUNTER — Ambulatory Visit (HOSPITAL_COMMUNITY)

## 2024-04-06 NOTE — Telephone Encounter (Signed)
 Patient was called concerning his missed appointment this afternoon. No answer but left message with details about no show policy, next treatment time, and office number should they need to reschedule or have any questions. No show number 1.  Jesse Rodgers, PT, DPT [facility] Office: [phone] 3:36 PM, 04/06/24

## 2024-04-11 ENCOUNTER — Ambulatory Visit (HOSPITAL_COMMUNITY)

## 2024-04-14 ENCOUNTER — Ambulatory Visit (HOSPITAL_COMMUNITY)

## 2024-05-15 ENCOUNTER — Ambulatory Visit: Admitting: Nurse Practitioner

## 2024-05-30 ENCOUNTER — Ambulatory Visit: Admitting: Physician Assistant
